# Patient Record
Sex: Female | Born: 1955 | Race: White | Hispanic: No | Marital: Married | Smoking: Never smoker
Health system: Southern US, Community
[De-identification: ages and names within clinical notes are randomized; demographics above are authoritative.]

## PROBLEM LIST (undated history)

## (undated) ENCOUNTER — Ambulatory Visit: Admission: EM

## (undated) DIAGNOSIS — K449 Diaphragmatic hernia without obstruction or gangrene: Secondary | ICD-10-CM

## (undated) DIAGNOSIS — K259 Gastric ulcer, unspecified as acute or chronic, without hemorrhage or perforation: Secondary | ICD-10-CM

## (undated) DIAGNOSIS — K219 Gastro-esophageal reflux disease without esophagitis: Secondary | ICD-10-CM

## (undated) DIAGNOSIS — F32A Depression, unspecified: Secondary | ICD-10-CM

## (undated) DIAGNOSIS — E785 Hyperlipidemia, unspecified: Secondary | ICD-10-CM

## (undated) DIAGNOSIS — I10 Essential (primary) hypertension: Secondary | ICD-10-CM

## (undated) DIAGNOSIS — I219 Acute myocardial infarction, unspecified: Secondary | ICD-10-CM

## (undated) DIAGNOSIS — R519 Headache, unspecified: Secondary | ICD-10-CM

## (undated) HISTORY — DX: Gastro-esophageal reflux disease without esophagitis: K21.9

## (undated) HISTORY — DX: Depression, unspecified: F32.A

## (undated) HISTORY — PX: CHOLECYSTECTOMY: SHX55

## (undated) HISTORY — DX: Acute myocardial infarction, unspecified: I21.9

---

## 2008-01-18 HISTORY — PX: CHOLECYSTECTOMY: SHX55

## 2010-01-28 DIAGNOSIS — Z9049 Acquired absence of other specified parts of digestive tract: Secondary | ICD-10-CM | POA: Insufficient documentation

## 2015-11-10 ENCOUNTER — Emergency Department
Admission: EM | Admit: 2015-11-10 | Discharge: 2015-11-10 | Disposition: A | Discharge status: Home / Self Care | Payer: 59 | Attending: Emergency Medicine | Admitting: Emergency Medicine

## 2015-11-10 ENCOUNTER — Encounter: Payer: Self-pay | Admitting: Emergency Medicine

## 2015-11-10 DIAGNOSIS — G43001 Migraine without aura, not intractable, with status migrainosus: Secondary | ICD-10-CM | POA: Diagnosis not present

## 2015-11-10 DIAGNOSIS — G43909 Migraine, unspecified, not intractable, without status migrainosus: Secondary | ICD-10-CM | POA: Diagnosis present

## 2015-11-10 HISTORY — DX: Gastric ulcer, unspecified as acute or chronic, without hemorrhage or perforation: K25.9

## 2015-11-10 MED ORDER — METOCLOPRAMIDE HCL 5 MG PO TABS: 5.0000 mg | ORAL_TABLET | Freq: Four times a day (QID) | ORAL | 0 refills | Status: DC | PRN

## 2015-11-10 MED ORDER — METOCLOPRAMIDE HCL 5 MG/ML IJ SOLN
INTRAMUSCULAR | Status: AC
  Administered 2015-11-10: 10 mg via INTRAVENOUS
  Filled 2015-11-10: qty 2

## 2015-11-10 MED ORDER — KETOROLAC TROMETHAMINE 30 MG/ML IJ SOLN
30.0000 mg | Freq: Once | INTRAMUSCULAR | Status: AC
  Administered 2015-11-10: 30 mg via INTRAVENOUS

## 2015-11-10 MED ORDER — SODIUM CHLORIDE 0.9 % IV BOLUS (SEPSIS)
1000.0000 mL | Freq: Once | INTRAVENOUS | Status: AC
  Administered 2015-11-10: 1000 mL via INTRAVENOUS

## 2015-11-10 MED ORDER — METOCLOPRAMIDE HCL 5 MG/ML IJ SOLN
10.0000 mg | Freq: Once | INTRAMUSCULAR | Status: AC
  Administered 2015-11-10: 10 mg via INTRAVENOUS

## 2015-11-10 MED ORDER — KETOROLAC TROMETHAMINE 30 MG/ML IJ SOLN
INTRAMUSCULAR | Status: AC
  Administered 2015-11-10: 30 mg via INTRAVENOUS
  Filled 2015-11-10: qty 1

## 2015-11-10 MED ORDER — ACETAMINOPHEN 500 MG PO TABS
ORAL_TABLET | ORAL | Status: AC
  Administered 2015-11-10: 1000 mg via ORAL
  Filled 2015-11-10: qty 2

## 2015-11-10 MED ORDER — ACETAMINOPHEN 500 MG PO TABS
1000.0000 mg | ORAL_TABLET | Freq: Once | ORAL | Status: AC
  Administered 2015-11-10: 1000 mg via ORAL

## 2015-11-10 NOTE — ED Notes (Signed)
Pt ambulatory to lobby. NAD noted.

## 2015-11-10 NOTE — Discharge Instructions (Signed)
Please drink plenty of fluid and get plenty of rest. You may continue to take Tylenol for your headache, and take Reglan if you develop a migraine. Please talk to your primary care physician about whether you are candidate for long-term preventative migraine medication.  Return to the emergency department for severe pain, numbness tingling or weakness, fever, inability to keep down fluids, or any other symptoms concerning to you.

## 2015-11-10 NOTE — ED Triage Notes (Signed)
Pt presents to ED with c/o right sided headache that radiates around to her eye and ear. Pt reports onset of her pain was Sunday but worsened today with nausea and sensitivity to light and sounds. Hx of headaches but only one similar to this one in the past year. Denies fever or vomiting/diarrhea. Pt alert and able to answer questions without difficulty. No increased work of breathing or distress noted.

## 2015-11-10 NOTE — ED Provider Notes (Signed)
[facility] Emergency Department Provider Note  ____________________________________________  Time seen: Approximately 7:51 PM  I have reviewed the triage vital signs and the nursing notes.   HISTORY  Chief Complaint Headache    HPI Joanne Clark is a 60 y.o. female with a history of chronic migraines presenting with migraine.The patient reports that she had a long car ride and her eyes were tired on Saturday. On Sunday, she developed a progressively worsening frontal and right-sided headache associated with photophobia, phonophobia, and mild nausea without vomiting. She also has had a "pulsating" sensation in the right eye without any visual changes including blurred or double vision. No travel outside denies states, trauma, fever, or tick bite. No rash. She has tried a single dose of Excedrin Migraine without improvement. She did feel like ice to the forehead and scalp improved her pain. This headache does feel similar to her previous migraines except that usually she has an aura with lites, did not have that this time.   Past Medical History:  Diagnosis Date  . Multiple gastric ulcers     There are no active problems to display for this patient.   Past Surgical History:  Procedure Laterality Date  . CHOLECYSTECTOMY        Allergies Review of patient's allergies indicates no known allergies.  No family history on file.  Social History Social History  Substance Use Topics  . Smoking status: Never Smoker  . Smokeless tobacco: Never Used  . Alcohol use No    Review of Systems Constitutional: No fever/chills.No trauma. Eyes: Positive photosensitivity. No blurred or double vision. ENT: No sore throat. No congestion or rhinorrhea. No tooth pain. No ear pain. Cardiovascular: Denies chest pain. Denies palpitations. Respiratory: Denies shortness of breath.  No cough. Gastrointestinal: No abdominal pain.  No nausea, no vomiting.  No diarrhea.   No constipation. Genitourinary: Negative for dysuria. Musculoskeletal: Negative for back pain. Skin: Negative for rash. Neurological: Positive for headaches. No focal numbness, tingling or weakness. No difficulty walking. Positive photo and phonophobia sensitivity.  10-point ROS otherwise negative.  ____________________________________________   PHYSICAL EXAM:  VITAL SIGNS: ED Triage Vitals [11/10/15 1913]  Enc Vitals Group     BP (!) 140/91     Pulse Rate 81     Resp 18     Temp 98 F (36.7 C)     Temp Source Oral     SpO2 98 %     Weight 210 lb (95.3 kg)     Height 5\' 8"  (1.727 m)     Head Circumference      Peak Flow      Pain Score 8     Pain Loc      Pain Edu?      Excl. in [place]?     Constitutional: Alert and oriented. Mildly uncomfortable appearing but in no acute distress. Answers questions appropriately. Eyes: Conjunctivae are normal.  EOMI. PERRLA. Positive photophobia. No scleral icterus. Head: Atraumatic. Nose: No congestion/rhinnorhea. Mouth/Throat: Mucous membranes are moist. No evidence of dental abnormality. Neck: No stridor.  Supple.  No JVD. No meningismus. Range of motion without pain. Cardiovascular: Normal rate, regular rhythm. No murmurs, rubs or gallops.  Respiratory: Normal respiratory effort.  No accessory muscle use or retractions. Lungs CTAB.  No wheezes, rales or ronchi. Musculoskeletal: No LE edema.  Neurologic:  A&Ox3.  Speech is clear.  Face and smile are symmetric.  EOMI. PERRLA. No nystagmus horizontally or vertically. Normal gait without ataxia. Moves all  extremities well. Skin:  Skin is warm, dry and intact. No rash noted. Psychiatric: Mood and affect are normal. Speech and behavior are normal.  Normal judgement.  ____________________________________________   LABS (all labs ordered are listed, but only abnormal results are displayed)  Labs Reviewed - No data to display ____________________________________________  EKG  Not  indicated ____________________________________________  RADIOLOGY  No results found.  ____________________________________________   PROCEDURES  Procedure(s) performed: None  Procedures  Critical Care performed: No ____________________________________________   INITIAL IMPRESSION / ASSESSMENT AND PLAN / ED COURSE  Pertinent labs & imaging results that were available during my care of the patient were reviewed by me and considered in my medical decision making (see chart for details).  60 y.o. female with a history of migraines presenting with migraine area and overall, the patient has a reassuring examination and there is no evidence of an acute neurologic deficit or acute CVA. I do not think she has temporal arteritis and it is very unlikely that she would have intercranial hemorrhage or an intracranial clot. We'll plan to treat her symptomatically, and reevaluate the patient. She does have a history of gastric ulcers, but I'll give her a single dose of Toradol with Tylenol and Reglan, and avoid narcotics for this patient.  ----------------------------------------- 8:56 PM on 11/10/2015 -----------------------------------------  Patient reports that her pain is significantly better and has improved from an 8 out of 10 to a 4 out of 10 at this time. I have offered her additional intravenous treatment, including finishing her fluids, and additional medications, but she prefers to go home with oral me I have discussed return precautions as well as follow-up with this patient and her daughter.Plan discharge. ____________________________________________  FINAL CLINICAL IMPRESSION(S) / ED DIAGNOSES  Final diagnoses:  Migraine without aura and with status migrainosus, not intractable    Clinical Course      NEW MEDICATIONS STARTED DURING THIS VISIT:  New Prescriptions   METOCLOPRAMIDE (REGLAN) 5 MG TABLET    Take 1 tablet (5 mg total) by mouth every 6 (six) hours as needed  for nausea or vomiting (migraine).      Eula Listen, MD 11/10/15 2059

## 2017-06-02 ENCOUNTER — Emergency Department
Admission: EM | Admit: 2017-06-02 | Discharge: 2017-06-02 | Disposition: A | Discharge status: Home / Self Care | Payer: 59 | Attending: Emergency Medicine | Admitting: Emergency Medicine

## 2017-06-02 ENCOUNTER — Emergency Department: Payer: 59

## 2017-06-02 ENCOUNTER — Other Ambulatory Visit: Payer: Self-pay

## 2017-06-02 DIAGNOSIS — M7731 Calcaneal spur, right foot: Secondary | ICD-10-CM | POA: Diagnosis not present

## 2017-06-02 DIAGNOSIS — M19071 Primary osteoarthritis, right ankle and foot: Secondary | ICD-10-CM | POA: Diagnosis not present

## 2017-06-02 DIAGNOSIS — M7751 Other enthesopathy of right foot: Secondary | ICD-10-CM

## 2017-06-02 DIAGNOSIS — M79671 Pain in right foot: Secondary | ICD-10-CM | POA: Diagnosis present

## 2017-06-02 DIAGNOSIS — Z79899 Other long term (current) drug therapy: Secondary | ICD-10-CM | POA: Diagnosis not present

## 2017-06-02 MED ORDER — TRAMADOL HCL 50 MG PO TABS: 50.0000 mg | ORAL_TABLET | Freq: Four times a day (QID) | ORAL | 0 refills | Status: DC | PRN

## 2017-06-02 MED ORDER — TRAMADOL HCL 50 MG PO TABS
50.0000 mg | ORAL_TABLET | Freq: Once | ORAL | Status: AC
  Administered 2017-06-02: 50 mg via ORAL
  Filled 2017-06-02: qty 1

## 2017-06-02 MED ORDER — MELOXICAM 15 MG PO TABS: 15.0000 mg | ORAL_TABLET | Freq: Every day | ORAL | 0 refills | Status: DC

## 2017-06-02 NOTE — ED Provider Notes (Signed)
[facility] Emergency Department Provider Note ____________________________________________  Time seen: Approximately 5:43 PM  I have reviewed the triage vital signs and the nursing notes.   HISTORY  Chief Complaint Foot Pain    HPI Joanne Clark is a 62 y.o. female who presents to the emergency department for evaluation and treatment of right foot pain that has been present for several months but has gradually worsened with a sudden increase in pain last night. She states that she bent over and felt something in her foot "pop." She has had intermittent tingling in the foot with radiation into the calf for several months. She reports that she frequently travels to [place] and pain seems to be worse after the drive. She has applied lidocaine patches over the foot and ankle without relief. Otherwise, no alleviating measures attempted for this complaint.  Past Medical History:  Diagnosis Date  . Multiple gastric ulcers     There are no active problems to display for this patient.   Past Surgical History:  Procedure Laterality Date  . CHOLECYSTECTOMY      Prior to Admission medications   Medication Sig Start Date End Date Taking? Authorizing Provider  meloxicam (MOBIC) 15 MG tablet Take 1 tablet (15 mg total) by mouth daily. 06/02/17   Marycarmen Hagey, Johnette Abraham B, FNP  metoCLOPramide (REGLAN) 5 MG tablet Take 1 tablet (5 mg total) by mouth every 6 (six) hours as needed for nausea or vomiting (migraine). 11/10/15 11/09/16  Eula Listen, MD  traMADol (ULTRAM) 50 MG tablet Take 1 tablet (50 mg total) by mouth every 6 (six) hours as needed. 06/02/17   Victorino Dike, FNP    Allergies Patient has no known allergies.  History reviewed. No pertinent family history.  Social History Social History   Tobacco Use  . Smoking status: Never Smoker  . Smokeless tobacco: Never Used  Substance Use Topics  . Alcohol use: No  . Drug use: No    Review of  Systems Constitutional: Negative for fever. Cardiovascular: Negative for chest pain. Respiratory: Negative for shortness of breath. Musculoskeletal: Positive for right foot and ankle pain Skin: negative for open wound or lesion.  Neurological: Negative for decrease in sensation. Positive for intermittent paresthesias.  ____________________________________________   PHYSICAL EXAM:  VITAL SIGNS: ED Triage Vitals  Enc Vitals Group     BP 06/02/17 1700 134/82     Pulse Rate 06/02/17 1658 73     Resp 06/02/17 1658 16     Temp 06/02/17 1658 98.1 F (36.7 C)     Temp Source 06/02/17 1658 Oral     SpO2 06/02/17 1658 98 %     Weight 06/02/17 1700 215 lb (97.5 kg)     Height 06/02/17 1700 5\' 8"  (1.727 m)     Head Circumference --      Peak Flow --      Pain Score 06/02/17 1700 4     Pain Loc --      Pain Edu? --      Excl. in [place]? --     Constitutional: Alert and oriented. Well appearing and in no acute distress. Eyes: Conjunctivae are clear without discharge or drainage Head: Atraumatic Neck: Supple Respiratory: No cough. Respirations are even and unlabored. Musculoskeletal: No edema of the right foot or ankle. Strength is 5/5. [place] ankle rules are negative.  Neurologic: Sharp and dull sensation of the right foot is intact.  Skin: Intact without open wound or lesion.  Psychiatric: Affect and behavior  are appropriate.  ____________________________________________   LABS (all labs ordered are listed, but only abnormal results are displayed)  Labs Reviewed - No data to display ____________________________________________  RADIOLOGY  Negative right foot image per radiology. ____________________________________________   PROCEDURES  .Splint Application Date/Time: 0/81/4481 5:50 PM Performed by: Archie Endo, NT Authorized by: Victorino Dike, FNP   Consent:    Consent obtained:  Verbal   Consent given by:  Patient   Risks discussed:  Pain Pre-procedure  details:    Sensation:  Normal   Skin color:  Normal Procedure details:    Laterality:  Right   Location:  Foot   Supplies:  Elastic bandage (and post op shoe) Post-procedure details:    Pain:  Unchanged   Sensation:  Normal   Patient tolerance of procedure:  Tolerated well, no immediate complications  ___________________________________________   INITIAL IMPRESSION / ASSESSMENT AND PLAN / ED COURSE  [name] is a 62 y.o. who presents to the emergency department for treatment and evaluation of right foot and ankle pain. Image reviewed. No acute findings. Osteoarthritis and osteophytes noted. She will be treated with post op shoe, ACE bandage, meloxicam, and tramadol. She was encouraged to see the podiatrist when she arrives back home ([place]). She was advised to see her PCP if unable to see the podiatrist.  Medications  traMADol (ULTRAM) tablet 50 mg (50 mg Oral Given 06/02/17 1801)    Pertinent labs & imaging results that were available during my care of the patient were reviewed by me and considered in my medical decision making (see chart for details).  _________________________________________   FINAL CLINICAL IMPRESSION(S) / ED DIAGNOSES  Final diagnoses:  Osteoarthritis of right foot, unspecified osteoarthritis type  Bone spur of right foot    ED Discharge Orders        Ordered    meloxicam (MOBIC) 15 MG tablet  Daily     06/02/17 1804    traMADol (ULTRAM) 50 MG tablet  Every 6 hours PRN     06/02/17 1804       If controlled substance prescribed during this visit, 12 month history viewed on the [place] prior to issuing an initial prescription for Schedule II or III opiod.    Victorino Dike, FNP 06/02/17 1812    Harvest Dark, MD 06/02/17 2248

## 2017-06-02 NOTE — ED Triage Notes (Signed)
Pt states has been having R foot pain x a while. States last night bent down and heard a pop and pain became worse. 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 Ambulatory on foot. Alert, oriented. States pain is sharp.

## 2017-09-08 ENCOUNTER — Encounter: Payer: Self-pay | Admitting: Emergency Medicine

## 2017-09-08 ENCOUNTER — Other Ambulatory Visit: Payer: Self-pay

## 2017-09-08 ENCOUNTER — Ambulatory Visit
Admission: EM | Admit: 2017-09-08 | Discharge: 2017-09-08 | Disposition: A | Discharge status: Home / Self Care | Payer: 59 | Attending: Family Medicine | Admitting: Family Medicine

## 2017-09-08 DIAGNOSIS — K449 Diaphragmatic hernia without obstruction or gangrene: Secondary | ICD-10-CM

## 2017-09-08 DIAGNOSIS — R1013 Epigastric pain: Secondary | ICD-10-CM

## 2017-09-08 DIAGNOSIS — K219 Gastro-esophageal reflux disease without esophagitis: Secondary | ICD-10-CM | POA: Diagnosis not present

## 2017-09-08 LAB — BASIC METABOLIC PANEL
Anion gap: 8 (ref 5–15)
BUN: 13 mg/dL (ref 8–23)
CO2: 25 mmol/L (ref 22–32)
Calcium: 9.4 mg/dL (ref 8.9–10.3)
Chloride: 108 mmol/L (ref 98–111)
Creatinine, Ser: 0.66 mg/dL (ref 0.44–1.00)
GFR calc Af Amer: >60 mL/min (ref >60)
GFR calc non Af Amer: >60 mL/min (ref >60)
Glucose, Bld: 96 mg/dL (ref 70–99)
Potassium: 3.7 mmol/L (ref 3.5–5.1)
Sodium: 141 mmol/L (ref 135–145)

## 2017-09-08 LAB — CBC WITH DIFFERENTIAL/PLATELET
Basophils Absolute: 0.1 10*3/uL (ref 0–0.1)
Basophils Relative: 1 %
Eosinophils Absolute: 0.2 10*3/uL (ref 0–0.7)
Eosinophils Relative: 3 %
HCT: 41 % (ref 35.0–47.0)
Hemoglobin: 13.7 g/dL (ref 12.0–16.0)
Lymphocytes Relative: 23 %
Lymphs Abs: 1.4 10*3/uL (ref 1.0–3.6)
MCH: 30.1 pg (ref 26.0–34.0)
MCHC: 33.4 g/dL (ref 32.0–36.0)
MCV: 90.2 fL (ref 80.0–100.0)
Monocytes Absolute: 0.5 10*3/uL (ref 0.2–0.9)
Monocytes Relative: 8 %
Neutro Abs: 3.8 10*3/uL (ref 1.4–6.5)
Neutrophils Relative %: 65 %
Platelets: 235 10*3/uL (ref 150–440)
RBC: 4.55 MIL/uL (ref 3.80–5.20)
RDW: 13.9 % (ref 11.5–14.5)
WBC: 5.9 10*3/uL (ref 3.6–11.0)

## 2017-09-08 LAB — LIPASE, BLOOD: Lipase: 23 U/L (ref 11–51)

## 2017-09-08 MED ORDER — GI COCKTAIL ~~LOC~~
30.0000 mL | Freq: Once | ORAL | Status: AC
  Administered 2017-09-08: 30 mL via ORAL

## 2017-09-08 MED ORDER — SUCRALFATE 1 G PO TABS: 1.0000 g | ORAL_TABLET | Freq: Three times a day (TID) | ORAL | 0 refills | Status: DC

## 2017-09-08 MED ORDER — PANTOPRAZOLE SODIUM 40 MG PO TBEC: 40.0000 mg | DELAYED_RELEASE_TABLET | Freq: Every day | ORAL | 0 refills | Status: DC

## 2017-09-08 NOTE — ED Triage Notes (Signed)
Pt c/o abdominal pain. Located all over her abdomen but worse in the LUQ. She has more reflux than normal. She was seen at ED in [place] about 2 months ago and given something to coat her stomach. No nausea, vomiting, diarrhea. She took Maalox and Zantac last night and helped a little.

## 2017-09-08 NOTE — Discharge Instructions (Signed)
Arrange appointment with gastroenterologist in [place] when you return.  If symptoms worsen go to the emergency room

## 2017-09-08 NOTE — ED Provider Notes (Signed)
[facility]    CSN: [id number] Arrival date & time: 09/08/17  0938     History   Chief Complaint Chief Complaint  Patient presents with  . Abdominal Pain    HPI Joanne Clark is a 62 y.o. female.   HPI  62 year old female visiting from [place] presents with abdominal pain she has had for the last 2 days.  States that it is worse in the epigastric region down to just superior to the umbilicus.  She is experiencing more reflux than normal.  In June she was seen in the emergency department in [facility] were normal and the CT scan showed possible hiatal hernia no bowel thickening and no mention of diverticulitis or diverticulosis.  She was given Carafate and Protonix at that time.  She states that she had run out of the Protonix had given only 30-day supply.  Had no nausea or vomiting denies any diarrhea.  Denies any hematemesis or melena. she has been passing gas normally.  Has been pushing her husband who weighs over 300 pounds in a wheelchair up ramps Wednesday.         Past Medical History:  Diagnosis Date  . Multiple gastric ulcers     There are no active problems to display for this patient.   Past Surgical History:  Procedure Laterality Date  . CHOLECYSTECTOMY      OB History   None      Home Medications    Prior to Admission medications   Medication Sig Start Date End Date Taking? Authorizing Provider  pantoprazole (PROTONIX) 40 MG tablet Take 1 tablet (40 mg total) by mouth daily. 09/08/17   Lorin Picket, PA-C  sucralfate (CARAFATE) 1 g tablet Take 1 tablet (1 g total) by mouth 4 (four) times daily -  with meals and at bedtime. 09/08/17   Lorin Picket, PA-C    Family History Family History  Problem Relation Age of Onset  . Dementia Mother   . Dementia Father     Social History Social History   Tobacco Use  . Smoking status: Never Smoker  . Smokeless tobacco: Never Used  Substance Use Topics  . Alcohol use: No  .  Drug use: No     Allergies   Patient has no known allergies.   Review of Systems Review of Systems  Constitutional: Positive for activity change and appetite change. Negative for chills, fatigue and fever.  Gastrointestinal: Positive for abdominal pain. Negative for abdominal distention, constipation, diarrhea, nausea and vomiting.  All other systems reviewed and are negative.    Physical Exam Triage Vital Signs ED Triage Vitals  Enc Vitals Group     BP 09/08/17 0939 136/81     Pulse Rate 09/08/17 0939 72     Resp 09/08/17 0939 17     Temp 09/08/17 0939 97.6 F (36.4 C)     Temp Source 09/08/17 0939 Oral     SpO2 09/08/17 0939 100 %     Weight 09/08/17 0937 200 lb (90.7 kg)     Height 09/08/17 0937 5\' 8"  (1.727 m)     Head Circumference --      Peak Flow --      Pain Score 09/08/17 0935 4     Pain Loc --      Pain Edu? --      Excl. in [place]? --    No data found.  Updated Vital Signs BP 136/81 (BP Location: Left Arm)  Pulse 72   Temp 97.6 F (36.4 C) (Oral)   Resp 17   Ht 5\' 8"  (1.727 m)   Wt 200 lb (90.7 kg)   SpO2 100%   BMI 30.41 kg/m   Visual Acuity Right Eye Distance:   Left Eye Distance:   Bilateral Distance:    Right Eye Near:   Left Eye Near:    Bilateral Near:     Physical Exam  Constitutional: She is oriented to person, place, and time. She appears well-developed and well-nourished.  Non-toxic appearance. She does not appear ill. No distress.  HENT:  Head: Normocephalic.  Eyes: Pupils are equal, round, and reactive to light.  Pulmonary/Chest: Effort normal and breath sounds normal.  Abdominal: Bowel sounds are normal. She exhibits no distension, no ascites, no pulsatile midline mass and no mass. There is tenderness in the epigastric area. There is no rigidity, no rebound, no guarding, no CVA tenderness and negative Murphy's sign.  The abdomen exam was performed with Nira Conn, RN as Education officer, museum.  There is no distention present.   Bowel sounds are normal in all quadrants.  Patient is maximally tender in the epigastric region.  Is less tenderness periumbilical.  There is no rebound and no guarding.  No masses are palpable.  Neurological: She is alert and oriented to person, place, and time.  Skin: Skin is warm and dry.  Psychiatric: She has a normal mood and affect. Her behavior is normal.  Nursing note and vitals reviewed.    UC Treatments / Results  Labs (all labs ordered are listed, but only abnormal results are displayed) Labs Reviewed  BASIC METABOLIC PANEL  LIPASE, BLOOD  CBC WITH DIFFERENTIAL/PLATELET    EKG None  Radiology No results found.  Procedures Procedures (including critical care time)  Medications Ordered in UC Medications  gi cocktail (Maalox,Lidocaine,Donnatal) (30 mLs Oral Given 09/08/17 1019)  She got significant relief of her pain after the GI cocktail  Initial Impression / Assessment and Plan / UC Course  I have reviewed the triage vital signs and the nursing notes.  Pertinent labs & imaging results that were available during my care of the patient were reviewed by me and considered in my medical decision making (see chart for details).     Plan: 1. Test/x-ray results and diagnosis reviewed with patient 2. rx as per orders; risks, benefits, potential side effects reviewed with patient 3. Recommend supportive treatment with choices for people with GERD information provided written.  Use Carafate and Protonix daily.  Recommend following up with a gastroenterologist when she returns to [place] next week.  If She has any worsening of symptoms she should go to the emergency room 4. F/u prn if symptoms worsen or don't improve  Final Clinical Impressions(s) / UC Diagnoses   Final diagnoses:  Epigastric pain  Hiatal hernia with GERD     Discharge Instructions     Arrange appointment with gastroenterologist in [place] when you return.  If symptoms worsen go to the emergency  room    ED Prescriptions    Medication Sig Dispense Auth. Provider   pantoprazole (PROTONIX) 40 MG tablet Take 1 tablet (40 mg total) by mouth daily. 30 tablet Crecencio Mc P, PA-C   sucralfate (CARAFATE) 1 g tablet Take 1 tablet (1 g total) by mouth 4 (four) times daily -  with meals and at bedtime. 120 tablet Lorin Picket, PA-C     Controlled Substance Prescriptions [place] Controlled Substance Registry consulted? Not  Applicable   Lorin Picket, PA-C 09/08/17 1056

## 2017-10-17 HISTORY — PX: COLONOSCOPY W/ ENDOSCOPIC US: SHX1379

## 2017-12-23 ENCOUNTER — Emergency Department
Admission: EM | Admit: 2017-12-23 | Discharge: 2017-12-23 | Disposition: A | Discharge status: Home / Self Care | Payer: 59 | Attending: Emergency Medicine | Admitting: Emergency Medicine

## 2017-12-23 ENCOUNTER — Encounter: Payer: Self-pay | Admitting: Emergency Medicine

## 2017-12-23 ENCOUNTER — Emergency Department: Payer: 59

## 2017-12-23 ENCOUNTER — Other Ambulatory Visit: Payer: Self-pay

## 2017-12-23 DIAGNOSIS — R109 Unspecified abdominal pain: Secondary | ICD-10-CM

## 2017-12-23 DIAGNOSIS — Z79899 Other long term (current) drug therapy: Secondary | ICD-10-CM | POA: Diagnosis not present

## 2017-12-23 DIAGNOSIS — M5416 Radiculopathy, lumbar region: Secondary | ICD-10-CM | POA: Insufficient documentation

## 2017-12-23 DIAGNOSIS — R1032 Left lower quadrant pain: Secondary | ICD-10-CM | POA: Diagnosis not present

## 2017-12-23 HISTORY — DX: Diaphragmatic hernia without obstruction or gangrene: K44.9

## 2017-12-23 LAB — CBC
HCT: 42.6 % (ref 36.0–46.0)
HEMOGLOBIN: 13.7 g/dL (ref 12.0–15.0)
MCH: 29.3 pg (ref 26.0–34.0)
MCHC: 32.2 g/dL (ref 30.0–36.0)
MCV: 91.2 fL (ref 80.0–100.0)
NRBC: 0 % (ref 0.0–0.2)
PLATELETS: 250 10*3/uL (ref 150–400)
RBC: 4.67 MIL/uL (ref 3.87–5.11)
RDW: 13.9 % (ref 11.5–15.5)
WBC: 5.8 10*3/uL (ref 4.0–10.5)

## 2017-12-23 LAB — URINALYSIS, COMPLETE (UACMP) WITH MICROSCOPIC
Bacteria, UA: NONE SEEN
Bilirubin Urine: NEGATIVE
GLUCOSE, UA: NEGATIVE mg/dL
KETONES UR: NEGATIVE mg/dL
LEUKOCYTES UA: NEGATIVE
Nitrite: NEGATIVE
PROTEIN: NEGATIVE mg/dL
SQUAMOUS EPITHELIAL / LPF: NONE SEEN (ref 0–5)
Specific Gravity, Urine: 1.006 (ref 1.005–1.030)
WBC UA: NONE SEEN WBC/hpf (ref 0–5)
pH: 5 (ref 5.0–8.0)

## 2017-12-23 LAB — COMPREHENSIVE METABOLIC PANEL
ALBUMIN: 4 g/dL (ref 3.5–5.0)
ALT: 31 U/L (ref 0–44)
ANION GAP: 5 (ref 5–15)
AST: 24 U/L (ref 15–41)
Alkaline Phosphatase: 85 U/L (ref 38–126)
BUN: 19 mg/dL (ref 8–23)
CHLORIDE: 107 mmol/L (ref 98–111)
CO2: 28 mmol/L (ref 22–32)
Calcium: 9.5 mg/dL (ref 8.9–10.3)
Creatinine, Ser: 0.63 mg/dL (ref 0.44–1.00)
GFR calc non Af Amer: >60 mL/min (ref >60)
GLUCOSE: 102 mg/dL — AB (ref 70–99)
POTASSIUM: 3.9 mmol/L (ref 3.5–5.1)
SODIUM: 140 mmol/L (ref 135–145)
Total Bilirubin: 0.7 mg/dL (ref 0.3–1.2)
Total Protein: 7.2 g/dL (ref 6.5–8.1)

## 2017-12-23 LAB — LIPASE, BLOOD: Lipase: 29 U/L (ref 11–51)

## 2017-12-23 MED ORDER — SUCRALFATE 1 G PO TABS: 1.0000 g | ORAL_TABLET | Freq: Four times a day (QID) | ORAL | 1 refills | Status: DC

## 2017-12-23 MED ORDER — NAPROXEN 500 MG PO TABS: 500.0000 mg | ORAL_TABLET | Freq: Two times a day (BID) | ORAL | 0 refills | Status: DC

## 2017-12-23 MED ORDER — GABAPENTIN 100 MG PO CAPS: ORAL_CAPSULE | ORAL | 0 refills | Status: DC

## 2017-12-23 NOTE — ED Triage Notes (Addendum)
L flank pain x 2 week, increased today. Denies fevers or dysuria.

## 2017-12-23 NOTE — ED Notes (Signed)
Waiting on disposition. No acute needs. VSS

## 2017-12-23 NOTE — ED Provider Notes (Signed)
[facility] Provider Note  ____________________________________________  Time seen: Approximately 7:52 PM  I have reviewed the triage vital signs and the nursing notes.   HISTORY  Chief Complaint Flank Pain    HPI Joanne Clark is a 62 y.o. female with a history of gastric ulcers and hiatal hernia who comes the ED complaining of left flank pain wrapping around toward the left lower abdomen, intermittent and waxing and waning for the past month.  Worse with lifting and standing for long periods of time.  No alleviating factors.  Moderate intensity.  Burning sensation.  She also has a history of chronic left upper quadrant pain that has had an extensive work-up in [place] from which she recently moved in the last few weeks.  She denies any chest pain shortness of breath or pleuritic symptoms.  No vomiting diarrhea bloody stool or black stool.  She is eating and drinking normally and otherwise feeling functional.  She would like referral information for primary care and gastroenterology in the area.      Past Medical History:  Diagnosis Date  . Hiatal hernia   . Multiple gastric ulcers      There are no active problems to display for this patient.    Past Surgical History:  Procedure Laterality Date  . CHOLECYSTECTOMY       Prior to Admission medications   Medication Sig Start Date End Date Taking? Authorizing Provider  gabapentin (NEURONTIN) 100 MG capsule Start with 100mg  three times daily by mouth for one week.  If symptoms are not improved, you may then increase the dose to 200mg  by mouth three times daily.  Remain at this dose until you follow up with primary care. 12/23/17   Carrie Mew, MD  naproxen (NAPROSYN) 500 MG tablet Take 1 tablet (500 mg total) by mouth 2 (two) times daily with a meal. 12/23/17   Carrie Mew, MD  pantoprazole (PROTONIX) 40 MG tablet Take 1 tablet (40 mg total) by mouth daily. 09/08/17    Lorin Picket, PA-C  sucralfate (CARAFATE) 1 g tablet Take 1 tablet (1 g total) by mouth 4 (four) times daily -  with meals and at bedtime. 09/08/17   Lorin Picket, PA-C  sucralfate (CARAFATE) 1 g tablet Take 1 tablet (1 g total) by mouth 4 (four) times daily. 12/23/17   Carrie Mew, MD   Protonix daily  Allergies Methylprednisolone   Family History  Problem Relation Age of Onset  . Dementia Mother   . Dementia Father     Social History Social History   Tobacco Use  . Smoking status: Never Smoker  . Smokeless tobacco: Never Used  Substance Use Topics  . Alcohol use: No  . Drug use: No    Review of Systems  Constitutional:   No fever or chills.  ENT:   No sore throat. No rhinorrhea. Cardiovascular:   No chest pain or syncope. Respiratory:   No dyspnea or cough. Gastrointestinal:   Negative for abdominal pain, vomiting and diarrhea.  No dysuria or hematuria Musculoskeletal:   Positive left flank pain as above. All other systems reviewed and are negative except as documented above in ROS and HPI.  ____________________________________________   PHYSICAL EXAM:  VITAL SIGNS: ED Triage Vitals  Enc Vitals Group     BP 12/23/17 1239 129/79     Pulse Rate 12/23/17 1239 75     Resp 12/23/17 1239 20     Temp 12/23/17 1239 98 F (  36.7 C)     Temp Source 12/23/17 1239 Oral     SpO2 12/23/17 1239 99 %     Weight 12/23/17 1242 200 lb (90.7 kg)     Height 12/23/17 1242 5\' 8"  (1.727 m)     Head Circumference --      Peak Flow --      Pain Score 12/23/17 1242 6     Pain Loc --      Pain Edu? --      Excl. in [place]? --     Vital signs reviewed, nursing assessments reviewed.   Constitutional:   Alert and oriented. Non-toxic appearance. Eyes:   Conjunctivae are normal. EOMI. PERRL. ENT      Head:   Normocephalic and atraumatic.      Nose:   No congestion/rhinnorhea.       Mouth/Throat:   MMM, no pharyngeal erythema. No peritonsillar mass.       Neck:   No  meningismus. Full ROM. Hematological/Lymphatic/Immunilogical:   No cervical lymphadenopathy. Cardiovascular:   RRR. Symmetric bilateral radial and DP pulses.  No murmurs. Cap refill less than 2 seconds. Respiratory:   Normal respiratory effort without tachypnea/retractions. Breath sounds are clear and equal bilaterally. No wheezes/rales/rhonchi. Gastrointestinal:   Soft and nontender. Non distended. There is no CVA tenderness.  No rebound, rigidity, or guarding.  Musculoskeletal:   Normal range of motion in all extremities. No joint effusions.  No lower extremity tenderness.  No edema.  Tenderness in the paraspinous musculature in the area of L3-L4 which reproduces symptoms. Neurologic:   Normal speech and language.  Motor grossly intact. No acute focal neurologic deficits are appreciated.  Skin:    Skin is warm, dry and intact. No rash noted.  No petechiae, purpura, or bullae.  ____________________________________________    LABS (pertinent positives/negatives) (all labs ordered are listed, but only abnormal results are displayed) Labs Reviewed  COMPREHENSIVE METABOLIC PANEL - Abnormal; Notable for the following components:      Result Value   Glucose, Bld 102 (*)    All other components within normal limits  URINALYSIS, COMPLETE (UACMP) WITH MICROSCOPIC - Abnormal; Notable for the following components:   Color, Urine STRAW (*)    APPearance CLEAR (*)    Hgb urine dipstick SMALL (*)    All other components within normal limits  CBC  LIPASE, BLOOD   ____________________________________________   EKG    ____________________________________________    RADIOLOGY  Ct Renal Stone Study  Result Date: 12/23/2017 CLINICAL DATA:  Acute left flank pain. EXAM: CT ABDOMEN AND PELVIS WITHOUT CONTRAST TECHNIQUE: Multidetector CT imaging of the abdomen and pelvis was performed following the standard protocol without IV contrast. COMPARISON:  None. FINDINGS: Lower chest: The lung bases  are clear of acute process. No pleural effusion or pulmonary lesions. The heart is normal in size. No pericardial effusion. The distal esophagus and aorta are unremarkable. Hepatobiliary: No focal hepatic lesions or intrahepatic biliary dilatation. The gallbladder surgically absent. No common bile duct dilatation. Pancreas: No mass, inflammation or ductal dilatation. Spleen: Normal size.  No focal lesions. Adrenals/Urinary Tract: The adrenal glands and kidneys are unremarkable. No renal, ureteral or bladder calculi or mass without contrast. Stomach/Bowel: The stomach, duodenum, small bowel and colon are grossly normal without oral contrast. No inflammatory changes, mass lesions or obstructive findings. The terminal ileum and appendix are normal. Vascular/Lymphatic: Scattered atherosclerotic calcifications involving the aorta and iliac arteries but no aneurysm. No mesenteric or retroperitoneal mass or adenopathy. Reproductive:  The uterus and ovaries are unremarkable. Other: No pelvic mass or adenopathy. No free pelvic fluid collections. No inguinal mass or adenopathy. No abdominal wall hernia or subcutaneous lesions. Musculoskeletal: No significant bony findings. IMPRESSION: 1. No acute abdominal/pelvic findings, mass lesions or adenopathy. 2. No renal, ureteral or bladder calculi or mass. 3. Status post cholecystectomy.  No biliary dilatation. Electronically Signed   By: Marijo Sanes M.D.   On: 12/23/2017 18:10    ____________________________________________   PROCEDURES Procedures  ____________________________________________  DIFFERENTIAL DIAGNOSIS   Kidney stone, lumbar strain, pinched nerve causing cutaneous neuropathy/radiculopathy  CLINICAL IMPRESSION / ASSESSMENT AND PLAN / ED COURSE  Pertinent labs & imaging results that were available during my care of the patient were reviewed by me and considered in my medical decision making (see chart for details).    Patient not in distress,  nontoxic.  Vital signs are normal, presents with subacute to chronic low back pain that seems to be exacerbated by increased exertion and lifting recently.  By exam this is a pinched nerve and I recommended the patient use heat therapy, stretching, NSAIDs, and gabapentin if symptoms not improved in the next couple days.  I obtain a CT scan to ensure that she does not have a critical stenosis or spinal fracture or large kidney stone.  CT scan is normal as are her labs.  Vitals remain normal, she is suitable for outpatient follow-up.  Given referral information for primary care and gastroenterology.  Is no signs of cauda equina syndrome or epidural abscess or other CNS/central spinal process.      ____________________________________________   FINAL CLINICAL IMPRESSION(S) / ED DIAGNOSES    Final diagnoses:  Left flank pain  Lumbar radiculopathy     ED Discharge Orders         Ordered    naproxen (NAPROSYN) 500 MG tablet  2 times daily with meals     12/23/17 1951    gabapentin (NEURONTIN) 100 MG capsule     12/23/17 1951    sucralfate (CARAFATE) 1 g tablet  4 times daily     12/23/17 1951          Portions of this note were generated with dragon dictation software. Dictation errors may occur despite best attempts at proofreading.    Carrie Mew, MD 12/23/17 Karl Bales

## 2017-12-23 NOTE — Discharge Instructions (Signed)
Your CT scan and labs today were all normal.  Continue Protonix and Carafate for your GERD symptoms.  Naproxen and gabapentin can be helpful for your back pain.

## 2017-12-23 NOTE — ED Notes (Signed)
Patient transported to CT 

## 2018-01-19 ENCOUNTER — Telehealth: Payer: Self-pay | Admitting: Gastroenterology

## 2018-01-19 ENCOUNTER — Encounter: Payer: Self-pay | Admitting: Gastroenterology

## 2018-01-19 NOTE — Telephone Encounter (Signed)
LEFT VM FOR PT TO CALL OFFICE AND SCHEDULE ED F/U WITH DR. VANGA

## 2018-02-14 ENCOUNTER — Ambulatory Visit (INDEPENDENT_AMBULATORY_CARE_PROVIDER_SITE_OTHER): Payer: Self-pay | Admitting: Gastroenterology

## 2018-02-14 ENCOUNTER — Encounter: Payer: Self-pay | Admitting: Gastroenterology

## 2018-02-14 ENCOUNTER — Other Ambulatory Visit: Payer: Self-pay

## 2018-02-14 VITALS — BP 122/77 | HR 71 | Resp 17 | Ht 68.0 in | Wt 212.8 lb

## 2018-02-14 DIAGNOSIS — G8929 Other chronic pain: Secondary | ICD-10-CM

## 2018-02-14 DIAGNOSIS — M545 Low back pain, unspecified: Secondary | ICD-10-CM

## 2018-02-14 DIAGNOSIS — R12 Heartburn: Secondary | ICD-10-CM

## 2018-02-14 MED ORDER — PANTOPRAZOLE SODIUM 40 MG PO TBEC: 40.0000 mg | DELAYED_RELEASE_TABLET | Freq: Every day | ORAL | 0 refills | Status: DC

## 2018-02-14 MED ORDER — CYCLOBENZAPRINE HCL 10 MG PO TABS: 10.0000 mg | ORAL_TABLET | Freq: Three times a day (TID) | ORAL | 0 refills | Status: DC | PRN

## 2018-02-14 NOTE — Progress Notes (Signed)
[name], MD [address]  [place]  [place], [place] [address]  Main: [phone]  Fax: [phone]    Gastroenterology Consultation  Referring Provider:     No ref. provider found Primary Care Physician:  System, Pcp Not In Primary Gastroenterologist:  Dr. Kathaleen Grinder Reason for Consultation:     Chronic left lower back pain        HPI:   Joanne Clark is a 63 y.o. female referred from ER for consultation & management of chronic left lower back pain.  [place].  She reports 8 months history of progressively worsening left lower back pain.  The pain initially started as discomfort below the left rib cage then moving to left lower back.  This started 8 months ago in [place], and she acknowledges that she did work her up lifting heavy weights. She was seen by GI in [place], Dr. Kathaleen Grinder initially as she was concerned if she had a hernia that popped causing the pain.  She underwent CT abdomen and pelvis followed by EGD, colonoscopy and gastric emptying study which were all unremarkable.  She was found to have 5 cm hiatal hernia per the EGD report.  However CT abdomen pelvis reported small hiatal hernia.  Patient reports intermittent heartburn for which she is taking Protonix daily.  She denies any other GI symptoms.  She denies weight loss, loss of appetite  Over the course of several months, she has noticed worsening of pain particularly when getting into the car and after prolonged walking.  She had atleast two exacerbations requiring ER visits.  Most recently, she went to [facility] ER, underwent CT renal study protocol which was unremarkable.  Her labs were unremarkable patient underwent cholecystectomy 2 years ago for chronic right upper quadrant pain and she was told that she has severe biliary dyskinesia based on the HIDA scan.  During majority of encounter with her, patient was pointing to left lateral lower  back with her finger  NSAIDs: None  Antiplts/Anticoagulants/Anti thrombotics: None  GI Procedures:   NM gastric emptying study 10/27/2017 On the first image, there is adequate radiotracer uptake within the stomach. There is no radiotracer within the distal esophagus to suggest gastroesophageal reflux.  On the 60 minute image, there is 78% radiotracer remaining within the stomach (upper limit of normal - 90%*).  On the 120 minute image, there is 34% radiotracer remaining within the stomach (upper limit of normal - 60%*).  On the 180 minute image, there is 6% radiotracer remaining within the stomach (upper limit of normal - 30%*).  On the 240 minute image, there is 5% radiotracer remaining within the stomach (upper limit of normal - 10%*).  **Normal limits of gastric retention data is from Am J Gastroenterology. 2007;102:1-11.   T1/2 time of 98 minutes is indicative of mildly delayed gastric emptying (upper    EGD 10/02/2017 Impression:     - Normal esophagus. Biopsied.            - 5 cm hiatal hernia.            - Normal stomach. Biopsied.            - Normal examined duodenum. Biopsied. 1.  Duodenum, biopsy:      -Unremarkable duodenal tissue.      -No celiac disease, dysplasia or malignancy.  2.  Stomach, biopsy:      -Unremarkable fundic type gastric mucosa.      -  No Helicobacter organisms on H/E stain.      -No dysplasia or malignancy.  3.  Esophagus, biopsy:      -Unremarkable squamous mucosa.      -No gastric mucosa.      -No eosinophilic esophagitis, dysplasia or malignancy.  Colonoscopy 10/30/2017 Findings:    The perianal and digital rectal examinations were normal.    The entire examined colon appeared normal on direct and retroflexion     views.   Past Medical History:  Diagnosis Date  . Hiatal hernia   . Multiple gastric ulcers     Past Surgical History:  Procedure Laterality Date  . CHOLECYSTECTOMY       Current Outpatient Medications:  .  pantoprazole (PROTONIX) 40 MG tablet, Take 1 tablet (40 mg total) by mouth daily before breakfast., Disp: 90 tablet, Rfl: 0 .  cyclobenzaprine (FLEXERIL) 10 MG tablet, Take 1 tablet (10 mg total) by mouth 3 (three) times daily as needed for muscle spasms., Disp: 30 tablet, Rfl: 0 .  gabapentin (NEURONTIN) 100 MG capsule, Start with 100mg  three times daily by mouth for one week.  If symptoms are not improved, you may then increase the dose to 200mg  by mouth three times daily.  Remain at this dose until you follow up with primary care. (Patient not taking: Reported on 02/14/2018), Disp: 60 capsule, Rfl: 0 .  naproxen (NAPROSYN) 500 MG tablet, Take 1 tablet (500 mg total) by mouth 2 (two) times daily with a meal. (Patient not taking: Reported on 02/14/2018), Disp: 20 tablet, Rfl: 0 .  peg 3350 powder (MOVIPREP) 100 g SOLR, Take by mouth., Disp: , Rfl:     Family History  Problem Relation Age of Onset  . Dementia Mother   . Dementia Father      Social History   Tobacco Use  . Smoking status: Never Smoker  . Smokeless tobacco: Never Used  Substance Use Topics  . Alcohol use: No  . Drug use: No    Allergies as of 02/14/2018 - Review Complete 02/14/2018  Allergen Reaction Noted  . Gluten meal  08/17/2006  . Methylprednisolone Other (See Comments) 12/23/2017    Review of Systems:    All systems reviewed and negative except where noted in HPI.   Physical Exam:  BP 122/77 (BP Location: Left Arm, Patient Position: Sitting, Cuff Size: Large)   Pulse 71   Resp 17   Ht 5\' 8"  (1.727 m)   Wt 212 lb 12.8 oz (96.5 kg)   BMI 32.36 kg/m  No LMP recorded. Patient is postmenopausal.  General:   Alert,  Well-developed, well-nourished, pleasant and cooperative in NAD Head:  Normocephalic and atraumatic. Eyes:  Sclera clear, no icterus.   Conjunctiva pink. Ears:  Normal auditory acuity. Nose:  No deformity, discharge, or lesions. Mouth:  No deformity or  lesions,oropharynx pink & moist. Neck:  Supple; no masses or thyromegaly. Lungs:  Respirations even and unlabored.  Clear throughout to auscultation.   No wheezes, crackles, or rhonchi. No acute distress. Heart:  Regular rate and rhythm; no murmurs, clicks, rubs, or gallops. Abdomen:  Normal bowel sounds. Soft, non-tender and non-distended without masses, hepatosplenomegaly or hernias noted.  No guarding or rebound tenderness.   Rectal: Not performed Msk:  Symmetrical without gross deformities. Good, equal movement & strength bilaterally. Pulses:  Normal pulses noted. Extremities:  No clubbing or edema.  No cyanosis. Neurologic:  Alert and oriented x3;  grossly normal neurologically. Skin:  Intact without significant lesions or rashes.  No jaundice. Psych:  Alert and cooperative. Normal mood and affect.  Imaging Studies: Reviewed  Assessment and Plan:   Donald Jacque is a 63 y.o. Caucasian female with no significant past medical history, obesity who presents with 46-months history of left lateral lower back pain after doing some heavy weight work.  She had extensive GI work-up which was unremarkable.  It is most likely musculoskeletal pain, paraspinal muscle strain or spasm.  I do not suspect that her symptoms are secondary to hiatal hernia  Patient is new to this area and trying to find a primary care provider  Will try Flexeril, recommended her to see an orthopedic Continue Protonix for heartburn I do not recommend any further GI work-up at this time She will try to establish care with primary care provider  Follow up as needed   [name], MD

## 2018-03-21 ENCOUNTER — Ambulatory Visit
Admission: EM | Admit: 2018-03-21 | Discharge: 2018-03-21 | Disposition: A | Discharge status: Home / Self Care | Payer: BLUE CROSS/BLUE SHIELD | Attending: Family Medicine | Admitting: Family Medicine

## 2018-03-21 ENCOUNTER — Ambulatory Visit (INDEPENDENT_AMBULATORY_CARE_PROVIDER_SITE_OTHER): Payer: BLUE CROSS/BLUE SHIELD

## 2018-03-21 ENCOUNTER — Encounter: Payer: Self-pay | Admitting: Emergency Medicine

## 2018-03-21 ENCOUNTER — Other Ambulatory Visit: Payer: Self-pay

## 2018-03-21 DIAGNOSIS — M79671 Pain in right foot: Secondary | ICD-10-CM

## 2018-03-21 MED ORDER — MELOXICAM 15 MG PO TABS: 15.0000 mg | ORAL_TABLET | Freq: Every day | ORAL | 0 refills | Status: DC | PRN

## 2018-03-21 NOTE — ED Provider Notes (Signed)
[facility]    CSN: [id number] Arrival date & time: 03/21/18  1311  History   Chief Complaint Foot pain HPI 63 year old female presents with right foot pain.  Patient states that she hit her foot on a bed frame approximately 2 weeks ago.  She had extensive bruising on the dorsum of her foot.  She states that the bruising is now improving.  However, she continues to have pain particularly on the dorsum of her foot at the level of the MTP joints.  Moderate in severity. She also reports pain on the plantar aspect of the foot at the same area.  Patient reports painful ambulation.  She has rested and iced the area without improvement.  No other associated symptoms.  No other complaints.  PMH, Surgical Hx, Family Hx, Social History reviewed and updated as below.  Past Medical History:  Diagnosis Date  . Hiatal hernia   . Multiple gastric ulcers    Patient Active Problem List   Diagnosis Date Noted  . History of cholecystectomy 01/28/2010   Past Surgical History:  Procedure Laterality Date  . CHOLECYSTECTOMY     OB History   No obstetric history on file.    Home Medications    Prior to Admission medications   Medication Sig Start Date End Date Taking? Authorizing Provider  cyclobenzaprine (FLEXERIL) 10 MG tablet Take 1 tablet (10 mg total) by mouth 3 (three) times daily as needed for muscle spasms. 02/14/18  Yes Vanga, Tally Due, MD  gabapentin (NEURONTIN) 100 MG capsule Start with 100mg  three times daily by mouth for one week.  If symptoms are not improved, you may then increase the dose to 200mg  by mouth three times daily.  Remain at this dose until you follow up with primary care. 12/23/17  Yes Carrie Mew, MD  pantoprazole (PROTONIX) 40 MG tablet Take 1 tablet (40 mg total) by mouth daily before breakfast. 02/14/18 05/15/18 Yes Vanga, Tally Due, MD  meloxicam (MOBIC) 15 MG tablet Take 1 tablet (15 mg total) by mouth daily as needed for pain. 03/21/18   Thersa Salt G, DO  peg 3350 powder (MOVIPREP) 100 g SOLR Take by mouth. 10/18/17   [provider]    Family History Family History  Problem Relation Age of Onset  . Dementia Mother   . Dementia Father     Social History Social History   Tobacco Use  . Smoking status: Never Smoker  . Smokeless tobacco: Never Used  Substance Use Topics  . Alcohol use: No  . Drug use: No     Allergies   Gluten meal and Methylprednisolone   Review of Systems Review of Systems  Constitutional: Negative.   Musculoskeletal:       Foot pain (right)   Physical Exam Triage Vital Signs ED Triage Vitals  Enc Vitals Group     BP 03/21/18 1336 122/85     Pulse Rate 03/21/18 1336 73     Resp 03/21/18 1336 18     Temp 03/21/18 1336 97.9 F (36.6 C)     Temp Source 03/21/18 1336 Oral     SpO2 03/21/18 1336 100 %     Weight 03/21/18 1333 210 lb (95.3 kg)     Height 03/21/18 1333 5\' 8"  (1.727 m)     Head Circumference --      Peak Flow --      Pain Score 03/21/18 1332 5     Pain Loc --  Pain Edu? --      Excl. in [place]? --    Updated Vital Signs BP 122/85   Pulse 73   Temp 97.9 F (36.6 C) (Oral)   Resp 18   Ht 5\' 8"  (1.727 m)   Wt 95.3 kg   SpO2 100%   BMI 31.93 kg/m   Visual Acuity Right Eye Distance:   Left Eye Distance:   Bilateral Distance:    Right Eye Near:   Left Eye Near:    Bilateral Near:     Physical Exam Vitals signs and nursing note reviewed.  Constitutional:      General: She is not in acute distress.    Appearance: Normal appearance.  HENT:     Head: Normocephalic and atraumatic.  Eyes:     General:        Right eye: No discharge.        Left eye: No discharge.     Conjunctiva/sclera: Conjunctivae normal.  Pulmonary:     Effort: Pulmonary effort is normal. No respiratory distress.  Musculoskeletal:     Comments: Right foot -tenderness at the level of the MTP joints diffusely.  No appreciable bruising at this time.  Right ankle -nontender.  No  effusion.  Neurological:     Mental Status: She is alert.  Psychiatric:        Mood and Affect: Mood normal.        Behavior: Behavior normal.    UC Treatments / Results  Labs (all labs ordered are listed, but only abnormal results are displayed) Labs Reviewed - No data to display  EKG None  Radiology Dg Foot Complete Right  Result Date: 03/21/2018 CLINICAL DATA:  63 year old female status post blunt trauma when struck foot on bed frame 2 weeks ago with continued pain and bruising. EXAM: RIGHT FOOT COMPLETE - 3+ VIEW COMPARISON:  06/02/2017. FINDINGS: There is no evidence of fracture or dislocation. There is no evidence of arthropathy or other focal bone abnormality. No discrete soft tissue injury. IMPRESSION: Stable and negative. Electronically Signed   By: Genevie Ann M.D.   On: 03/21/2018 14:18    Procedures Procedures (including critical care time)  Medications Ordered in UC Medications - No data to display  Initial Impression / Assessment and Plan / UC Course  I have reviewed the triage vital signs and the nursing notes.  Pertinent labs & imaging results that were available during my care of the patient were reviewed by me and considered in my medical decision making (see chart for details).    63 year old female presents with foot pain after suffering injury.  X-ray negative.  Meloxicam as needed.  Placed in a postop shoe for comfort.  Final Clinical Impressions(s) / UC Diagnoses   Final diagnoses:  Right foot pain     Discharge Instructions     Rest, elevation.  Medication as prescribed.   Post op shoe for comfort.  Take care  Dr. Lacinda Axon    ED Prescriptions    Medication Sig Dispense Auth. Provider   meloxicam (MOBIC) 15 MG tablet Take 1 tablet (15 mg total) by mouth daily as needed for pain. 30 tablet Coral Spikes, DO     Controlled Substance Prescriptions [place] Controlled Substance Registry consulted? Not Applicable   Coral Spikes, DO 03/21/18  1506

## 2018-03-21 NOTE — ED Triage Notes (Signed)
Patient states that she hit her foot on a bed frame about 2 weeks ago and it is still very painful and bruised

## 2018-03-21 NOTE — Discharge Instructions (Signed)
Rest, elevation.  Medication as prescribed.   Post op shoe for comfort.  Take care  Dr. Lacinda Axon

## 2018-04-10 DIAGNOSIS — I213 ST elevation (STEMI) myocardial infarction of unspecified site: Secondary | ICD-10-CM | POA: Insufficient documentation

## 2018-04-10 DIAGNOSIS — I252 Old myocardial infarction: Secondary | ICD-10-CM | POA: Insufficient documentation

## 2018-04-10 HISTORY — DX: ST elevation (STEMI) myocardial infarction of unspecified site: I21.3

## 2018-05-08 ENCOUNTER — Other Ambulatory Visit: Payer: Self-pay

## 2018-05-08 ENCOUNTER — Encounter: Payer: Self-pay | Admitting: Family Medicine

## 2018-05-08 ENCOUNTER — Ambulatory Visit: Payer: BLUE CROSS/BLUE SHIELD | Admitting: Family Medicine

## 2018-05-08 VITALS — BP 110/64 | HR 72 | Ht 68.0 in | Wt 214.0 lb

## 2018-05-08 DIAGNOSIS — Z7689 Persons encountering health services in other specified circumstances: Secondary | ICD-10-CM | POA: Diagnosis not present

## 2018-05-08 NOTE — Patient Instructions (Signed)
This information is directly available on the CDC website: https://www.cdc.gov/coronavirus/2019-ncov/if-you-are-sick/steps-when-sick.html    Source:CDC Reference to specific commercial products, manufacturers, companies, or trademarks does not constitute its endorsement or recommendation by the U.S. Government, Department of Health and Human Services, or Centers for Disease Control and Prevention.  

## 2018-05-08 NOTE — Progress Notes (Signed)
Date:  05/08/2018   Name:  Joanne Clark   DOB:  [birth year]   MRN:  [id number]   Chief Complaint: Establish Care  Patient is a 63 year old female who presents for a comprehensive physical exam. The patient reports the following problems: none. Health maintenance has been reviewed up to date.   Review of Systems  Constitutional: Negative.  Negative for chills, fatigue, fever and unexpected weight change.  HENT: Negative for congestion, ear discharge, ear pain, hearing loss, mouth sores, nosebleeds, postnasal drip, rhinorrhea, sinus pressure, sneezing and sore throat.   Eyes: Negative for photophobia, pain, discharge, redness and itching.  Respiratory: Negative for apnea, cough, choking, chest tightness, shortness of breath, wheezing and stridor.   Cardiovascular: Negative for chest pain, palpitations and leg swelling.  Gastrointestinal: Negative for abdominal distention, abdominal pain, blood in stool, constipation, diarrhea, nausea and vomiting.  Endocrine: Negative for cold intolerance, heat intolerance, polydipsia, polyphagia and polyuria.  Genitourinary: Negative for dysuria, enuresis, flank pain, frequency, hematuria, menstrual problem, pelvic pain, urgency, vaginal bleeding and vaginal discharge.  Musculoskeletal: Negative for arthralgias, back pain and myalgias.  Skin: Negative for rash.  Allergic/Immunologic: Negative for environmental allergies and food allergies.  Neurological: Negative for dizziness, syncope, speech difficulty, weakness, light-headedness, numbness and headaches.  Hematological: Negative for adenopathy. Does not bruise/bleed easily.  Psychiatric/Behavioral: Negative for dysphoric mood. The patient is not nervous/anxious.     Patient Active Problem List   Diagnosis Date Noted   STEMI (ST elevation myocardial infarction) ([place]) 04/10/2018   History of cholecystectomy 01/28/2010    Allergies  Allergen Reactions   Gluten Meal     Other reaction(s): GI  Reaction   Methylprednisolone Other (See Comments)    dizzy    Past Surgical History:  Procedure Laterality Date   CHOLECYSTECTOMY     COLONOSCOPY W/ ENDOSCOPIC [place]  10/17/2017    Social History   Tobacco Use   Smoking status: Never Smoker   Smokeless tobacco: Never Used  Substance Use Topics   Alcohol use: No   Drug use: No     Medication list has been reviewed and updated.  Current Meds  Medication Sig   apixaban (ELIQUIS) 5 MG TABS tablet Take 1 tablet by mouth 2 (two) times daily. cardio   atorvastatin (LIPITOR) 80 MG tablet Take 1 tablet by mouth daily. Dr Dorna Bloom cardio   furosemide (LASIX) 20 MG tablet Take by mouth as needed. cardio   metoprolol succinate (TOPROL-XL) 25 MG 24 hr tablet Take 12.5 mg by mouth daily. cardio   nitroGLYCERIN (NITROSTAT) 0.4 MG SL tablet Place under the tongue. cardio   pantoprazole (PROTONIX) 20 MG tablet Take 1 tablet by mouth daily. cardio   prasugrel (EFFIENT) 10 MG TABS tablet Take 1 tablet by mouth daily. cardio   [DISCONTINUED] gabapentin (NEURONTIN) 100 MG capsule Start with 100mg  three times daily by mouth for one week.  If symptoms are not improved, you may then increase the dose to 200mg  by mouth three times daily.  Remain at this dose until you follow up with primary care.   [DISCONTINUED] pantoprazole (PROTONIX) 40 MG tablet Take 1 tablet (40 mg total) by mouth daily before breakfast. (Patient taking differently: Take 20 mg by mouth daily before breakfast. )    PHQ 2/9 Scores 05/08/2018  PHQ - 2 Score 1  PHQ- 9 Score 1    BP Readings from Last 3 Encounters:  05/08/18 110/64  03/21/18 122/85  02/14/18 122/77    Physical  Exam Vitals signs and nursing note reviewed.  Constitutional:      General: She is not in acute distress.    Appearance: She is normal weight. She is not diaphoretic.  HENT:     Head: Normocephalic and atraumatic.     Right Ear: Tympanic membrane, ear canal and external ear normal.       Left Ear: Tympanic membrane, ear canal and external ear normal.     Nose: Nose normal. No congestion or rhinorrhea.  Eyes:     General:        Right eye: No discharge.        Left eye: No discharge.     Conjunctiva/sclera: Conjunctivae normal.     Pupils: Pupils are equal, round, and reactive to light.  Neck:     Musculoskeletal: Normal range of motion and neck supple.     Thyroid: No thyromegaly.     Vascular: No JVD.  Cardiovascular:     Rate and Rhythm: Normal rate and regular rhythm.     Heart sounds: Normal heart sounds. No murmur. No friction rub. No gallop.   Pulmonary:     Effort: Pulmonary effort is normal.     Breath sounds: Normal breath sounds. No wheezing, rhonchi or rales.  Chest:     Chest wall: No tenderness.  Abdominal:     General: Bowel sounds are normal.     Palpations: Abdomen is soft. There is no mass.     Tenderness: There is no abdominal tenderness. There is no right CVA tenderness or guarding.  Musculoskeletal: Normal range of motion.  Lymphadenopathy:     Cervical: No cervical adenopathy.  Skin:    General: Skin is warm and dry.  Neurological:     Mental Status: She is alert.     Deep Tendon Reflexes: Reflexes are normal and symmetric.     Wt Readings from Last 3 Encounters:  05/08/18 214 lb (97.1 kg)  03/21/18 210 lb (95.3 kg)  02/14/18 212 lb 12.8 oz (96.5 kg)    BP 110/64    Pulse 72    Ht 5\' 8"  (1.727 m)    Wt 214 lb (97.1 kg)    BMI 32.54 kg/m   Assessment and Plan:  1. Establishing care with new doctor, encounter for Patient was establish care with new physician.  Patient's chart was reviewed for previous admission at [facility] for the elevation myocardial infarction.  Patient's chart was also reviewed for previous labs and imaging.  Discussed her medications and these will be continued and patient has now established and was encouraged to call us under any circumstances for recurrence of chest pain or similar circumstances.

## 2018-05-20 DIAGNOSIS — K449 Diaphragmatic hernia without obstruction or gangrene: Secondary | ICD-10-CM | POA: Insufficient documentation

## 2018-05-20 DIAGNOSIS — I499 Cardiac arrhythmia, unspecified: Secondary | ICD-10-CM | POA: Diagnosis not present

## 2018-05-20 DIAGNOSIS — I252 Old myocardial infarction: Secondary | ICD-10-CM | POA: Diagnosis not present

## 2018-05-20 DIAGNOSIS — R072 Precordial pain: Secondary | ICD-10-CM | POA: Diagnosis not present

## 2018-05-20 DIAGNOSIS — R531 Weakness: Secondary | ICD-10-CM | POA: Diagnosis not present

## 2018-05-20 DIAGNOSIS — R110 Nausea: Secondary | ICD-10-CM | POA: Diagnosis not present

## 2018-05-20 DIAGNOSIS — Z955 Presence of coronary angioplasty implant and graft: Secondary | ICD-10-CM | POA: Diagnosis not present

## 2018-05-20 DIAGNOSIS — R079 Chest pain, unspecified: Secondary | ICD-10-CM | POA: Diagnosis not present

## 2018-05-20 DIAGNOSIS — K279 Peptic ulcer, site unspecified, unspecified as acute or chronic, without hemorrhage or perforation: Secondary | ICD-10-CM | POA: Insufficient documentation

## 2018-05-20 DIAGNOSIS — I455 Other specified heart block: Secondary | ICD-10-CM | POA: Diagnosis not present

## 2018-05-20 DIAGNOSIS — I5022 Chronic systolic (congestive) heart failure: Secondary | ICD-10-CM | POA: Diagnosis not present

## 2018-05-20 DIAGNOSIS — Z79899 Other long term (current) drug therapy: Secondary | ICD-10-CM | POA: Diagnosis not present

## 2018-05-20 DIAGNOSIS — G43909 Migraine, unspecified, not intractable, without status migrainosus: Secondary | ICD-10-CM | POA: Insufficient documentation

## 2018-05-20 DIAGNOSIS — I4891 Unspecified atrial fibrillation: Secondary | ICD-10-CM | POA: Insufficient documentation

## 2018-05-20 DIAGNOSIS — E785 Hyperlipidemia, unspecified: Secondary | ICD-10-CM | POA: Diagnosis not present

## 2018-05-20 DIAGNOSIS — I2542 Coronary artery dissection: Secondary | ICD-10-CM | POA: Diagnosis not present

## 2018-05-20 DIAGNOSIS — I5042 Chronic combined systolic (congestive) and diastolic (congestive) heart failure: Secondary | ICD-10-CM | POA: Insufficient documentation

## 2018-05-20 DIAGNOSIS — I491 Atrial premature depolarization: Secondary | ICD-10-CM | POA: Diagnosis not present

## 2018-05-20 DIAGNOSIS — R1012 Left upper quadrant pain: Secondary | ICD-10-CM | POA: Diagnosis not present

## 2018-05-20 DIAGNOSIS — R55 Syncope and collapse: Secondary | ICD-10-CM | POA: Diagnosis not present

## 2018-05-20 DIAGNOSIS — K828 Other specified diseases of gallbladder: Secondary | ICD-10-CM | POA: Diagnosis not present

## 2018-05-20 DIAGNOSIS — Z8679 Personal history of other diseases of the circulatory system: Secondary | ICD-10-CM | POA: Diagnosis not present

## 2018-05-20 DIAGNOSIS — I5020 Unspecified systolic (congestive) heart failure: Secondary | ICD-10-CM | POA: Diagnosis not present

## 2018-05-21 DIAGNOSIS — I252 Old myocardial infarction: Secondary | ICD-10-CM | POA: Diagnosis not present

## 2018-05-21 DIAGNOSIS — I517 Cardiomegaly: Secondary | ICD-10-CM | POA: Diagnosis not present

## 2018-05-21 DIAGNOSIS — I5020 Unspecified systolic (congestive) heart failure: Secondary | ICD-10-CM | POA: Diagnosis not present

## 2018-05-21 DIAGNOSIS — R079 Chest pain, unspecified: Secondary | ICD-10-CM | POA: Diagnosis not present

## 2018-05-21 MED ORDER — GENERIC EXTERNAL MEDICATION
2.00 | Status: DC
Start: 2018-05-21 — End: 2018-05-21

## 2018-05-21 MED ORDER — ACETAMINOPHEN 500 MG PO TABS
1000.00 | ORAL_TABLET | ORAL | Status: DC
Start: ? — End: 2018-05-21

## 2018-05-21 MED ORDER — METOPROLOL SUCCINATE ER 25 MG PO TB24
25.00 | ORAL_TABLET | ORAL | Status: DC
Start: 2018-05-22 — End: 2018-05-21

## 2018-05-21 MED ORDER — PRASUGREL HCL 10 MG PO TABS
10.00 | ORAL_TABLET | ORAL | Status: DC
Start: 2018-05-22 — End: 2018-05-21

## 2018-05-21 MED ORDER — PANTOPRAZOLE SODIUM 20 MG PO TBEC
20.00 | DELAYED_RELEASE_TABLET | ORAL | Status: DC
Start: 2018-05-22 — End: 2018-05-21

## 2018-05-21 MED ORDER — ATORVASTATIN CALCIUM 80 MG PO TABS
80.00 | ORAL_TABLET | ORAL | Status: DC
Start: 2018-05-21 — End: 2018-05-21

## 2018-05-21 MED ORDER — LISINOPRIL 5 MG PO TABS
5.00 | ORAL_TABLET | ORAL | Status: DC
Start: 2018-05-22 — End: 2018-05-21

## 2018-05-21 MED ORDER — POLYETHYLENE GLYCOL 3350 17 G PO PACK
17.00 | PACK | ORAL | Status: DC
Start: 2018-05-22 — End: 2018-05-21

## 2018-05-21 MED ORDER — LIDOCAINE 5 % EX PTCH
1.00 | MEDICATED_PATCH | CUTANEOUS | Status: DC
Start: ? — End: 2018-05-21

## 2018-05-21 MED ORDER — APIXABAN 5 MG PO TABS
5.00 | ORAL_TABLET | ORAL | Status: DC
Start: 2018-05-21 — End: 2018-05-21

## 2018-06-13 DIAGNOSIS — I2102 ST elevation (STEMI) myocardial infarction involving left anterior descending coronary artery: Secondary | ICD-10-CM | POA: Diagnosis not present

## 2018-06-13 DIAGNOSIS — Z9861 Coronary angioplasty status: Secondary | ICD-10-CM | POA: Diagnosis not present

## 2018-06-13 DIAGNOSIS — Z955 Presence of coronary angioplasty implant and graft: Secondary | ICD-10-CM | POA: Diagnosis not present

## 2018-07-25 DIAGNOSIS — Z9049 Acquired absence of other specified parts of digestive tract: Secondary | ICD-10-CM | POA: Diagnosis not present

## 2018-07-25 DIAGNOSIS — Z79899 Other long term (current) drug therapy: Secondary | ICD-10-CM | POA: Diagnosis not present

## 2018-07-25 DIAGNOSIS — I5042 Chronic combined systolic (congestive) and diastolic (congestive) heart failure: Secondary | ICD-10-CM | POA: Diagnosis not present

## 2018-07-25 DIAGNOSIS — I480 Paroxysmal atrial fibrillation: Secondary | ICD-10-CM | POA: Diagnosis not present

## 2018-07-25 DIAGNOSIS — I252 Old myocardial infarction: Secondary | ICD-10-CM | POA: Diagnosis not present

## 2018-07-25 DIAGNOSIS — I2510 Atherosclerotic heart disease of native coronary artery without angina pectoris: Secondary | ICD-10-CM | POA: Diagnosis not present

## 2018-07-25 DIAGNOSIS — E785 Hyperlipidemia, unspecified: Secondary | ICD-10-CM | POA: Diagnosis not present

## 2018-07-25 DIAGNOSIS — Z7901 Long term (current) use of anticoagulants: Secondary | ICD-10-CM | POA: Diagnosis not present

## 2018-07-25 DIAGNOSIS — R9431 Abnormal electrocardiogram [ECG] [EKG]: Secondary | ICD-10-CM | POA: Diagnosis not present

## 2018-07-25 DIAGNOSIS — I255 Ischemic cardiomyopathy: Secondary | ICD-10-CM | POA: Diagnosis not present

## 2018-10-18 ENCOUNTER — Ambulatory Visit: Payer: BLUE CROSS/BLUE SHIELD | Admitting: Family Medicine

## 2018-10-22 ENCOUNTER — Ambulatory Visit (INDEPENDENT_AMBULATORY_CARE_PROVIDER_SITE_OTHER): Payer: Medicare Other | Admitting: Family Medicine

## 2018-10-22 ENCOUNTER — Other Ambulatory Visit: Payer: Self-pay

## 2018-10-22 ENCOUNTER — Encounter: Payer: Self-pay | Admitting: Family Medicine

## 2018-10-22 VITALS — BP 98/62 | HR 80 | Ht 68.0 in | Wt 225.0 lb

## 2018-10-22 DIAGNOSIS — I255 Ischemic cardiomyopathy: Secondary | ICD-10-CM

## 2018-10-22 DIAGNOSIS — Z23 Encounter for immunization: Secondary | ICD-10-CM | POA: Diagnosis not present

## 2018-10-22 DIAGNOSIS — K219 Gastro-esophageal reflux disease without esophagitis: Secondary | ICD-10-CM

## 2018-10-22 MED ORDER — PANTOPRAZOLE SODIUM 40 MG PO TBEC: 40.0000 mg | DELAYED_RELEASE_TABLET | Freq: Every day | ORAL | 1 refills | Status: DC

## 2018-10-22 NOTE — Progress Notes (Signed)
Date:  10/22/2018   Name:  Joanne Clark   DOB:  06/17/1955   MRN:  [id number]   Chief Complaint: Follow-up (PHQ9=3 and GAD7=4)  Gastroesophageal Reflux She complains of chest pain. She reports no abdominal pain, no belching, no choking, no coughing, no dysphagia, no early satiety, no globus sensation, no heartburn, no hoarse voice, no nausea, no sore throat, no stridor, no tooth decay, no water brash or no wheezing. This is a recurrent problem. The current episode started more than 1 year ago. The problem occurs frequently. The problem has been waxing and waning. The symptoms are aggravated by certain foods. Pertinent negatives include no anemia, fatigue, melena, muscle weakness, orthopnea or weight loss. There are no known risk factors. She has tried a PPI for the symptoms. The treatment provided mild relief. Past procedures do not include an abdominal ultrasound, an EGD, esophageal manometry, esophageal pH monitoring, H. pylori antibody titer or a UGI.  Heart Problem This is a new problem. The current episode started more than 1 month ago. The problem occurs intermittently. The problem has been waxing and waning. Associated symptoms include chest pain. Pertinent negatives include no abdominal pain, anorexia, arthralgias, change in bowel habit, chills, congestion, coughing, fatigue, fever, headaches, joint swelling, myalgias, nausea, neck pain, numbness, rash, sore throat, swollen glands, urinary symptoms, vertigo, vomiting or weakness. The treatment provided mild relief.    Review of Systems  Constitutional: Negative.  Negative for chills, fatigue, fever, unexpected weight change and weight loss.  HENT: Negative for congestion, ear discharge, ear pain, hoarse voice, rhinorrhea, sinus pressure, sneezing and sore throat.   Eyes: Negative for photophobia, pain, discharge, redness and itching.  Respiratory: Negative for cough, choking, shortness of breath, wheezing and stridor.    Cardiovascular: Positive for chest pain.  Gastrointestinal: Negative for abdominal pain, anorexia, blood in stool, change in bowel habit, constipation, diarrhea, dysphagia, heartburn, melena, nausea and vomiting.  Endocrine: Negative for cold intolerance, heat intolerance, polydipsia, polyphagia and polyuria.  Genitourinary: Negative for dysuria, flank pain, frequency, hematuria, menstrual problem, pelvic pain, urgency, vaginal bleeding and vaginal discharge.  Musculoskeletal: Negative for arthralgias, back pain, joint swelling, myalgias, muscle weakness and neck pain.  Skin: Negative for rash.  Allergic/Immunologic: Negative for environmental allergies and food allergies.  Neurological: Negative for dizziness, vertigo, weakness, light-headedness, numbness and headaches.  Hematological: Negative for adenopathy. Does not bruise/bleed easily.  Psychiatric/Behavioral: Negative for dysphoric mood. The patient is not nervous/anxious.     Patient Active Problem List   Diagnosis Date Noted  . Atrial fibrillation ([place]) 05/20/2018  . Hiatal hernia 05/20/2018  . Migraine headache 05/20/2018  . STEMI (ST elevation myocardial infarction) ([place]) 04/10/2018  . History of ST elevation myocardial infarction (STEMI) 04/10/2018  . History of cholecystectomy 01/28/2010    Allergies  Allergen Reactions  . Gluten Meal     Other reaction(s): GI Reaction  . Methylprednisolone Other (See Comments)    dizzy    Past Surgical History:  Procedure Laterality Date  . CHOLECYSTECTOMY    . COLONOSCOPY W/ ENDOSCOPIC [place]  10/17/2017    Social History   Tobacco Use  . Smoking status: Never Smoker  . Smokeless tobacco: Never Used  Substance Use Topics  . Alcohol use: No  . Drug use: No     Medication list has been reviewed and updated.  Current Meds  Medication Sig  . apixaban (ELIQUIS) 5 MG TABS tablet Take 1 tablet by mouth 2 (two) times daily. cardio  . atorvastatin (LIPITOR)  80 MG tablet Take 1  tablet by mouth daily. Dr Claud Kelp [facility] cardio  . furosemide (LASIX) 20 MG tablet Take by mouth as needed. cardio  . lisinopril (ZESTRIL) 5 MG tablet Take 1 tablet by mouth daily. cardio  . metoprolol succinate (TOPROL-XL) 25 MG 24 hr tablet Take 12.5 mg by mouth daily. cardio  . nitroGLYCERIN (NITROSTAT) 0.4 MG SL tablet Place under the tongue. cardio  . pantoprazole (PROTONIX) 20 MG tablet Take 1 tablet by mouth daily. cardio  . prasugrel (EFFIENT) 10 MG TABS tablet Take 1 tablet by mouth daily. cardio    PHQ 2/9 Scores 10/22/2018 05/08/2018  PHQ - 2 Score 1 1  PHQ- 9 Score 3 1    BP Readings from Last 3 Encounters:  10/22/18 98/62  05/08/18 110/64  03/21/18 122/85    Physical Exam Vitals signs and nursing note reviewed.  Constitutional:      Appearance: She is well-developed.  HENT:     Head: Normocephalic.     Right Ear: External ear normal.     Left Ear: External ear normal.  Eyes:     General: Lids are everted, no foreign bodies appreciated. No scleral icterus.       Left eye: No foreign body or hordeolum.     Conjunctiva/sclera: Conjunctivae normal.     Right eye: Right conjunctiva is not injected.     Left eye: Left conjunctiva is not injected.     Pupils: Pupils are equal, round, and reactive to light.  Neck:     Musculoskeletal: Normal range of motion and neck supple.     Thyroid: No thyromegaly.     Vascular: No JVD.     Trachea: No tracheal deviation.  Cardiovascular:     Rate and Rhythm: Normal rate and regular rhythm.     Heart sounds: Normal heart sounds. No murmur. No friction rub. No gallop.   Pulmonary:     Effort: Pulmonary effort is normal. No respiratory distress.     Breath sounds: Normal breath sounds. No stridor. No wheezing, rhonchi or rales.  Abdominal:     General: Bowel sounds are normal.     Palpations: Abdomen is soft. There is no mass.     Tenderness: There is no abdominal tenderness. There is no guarding or rebound.  Musculoskeletal: Normal  range of motion.        General: No tenderness.  Lymphadenopathy:     Cervical: No cervical adenopathy.  Skin:    General: Skin is warm.     Findings: No rash.  Neurological:     Mental Status: She is alert and oriented to person, place, and time.     Cranial Nerves: No cranial nerve deficit.     Deep Tendon Reflexes: Reflexes normal.  Psychiatric:        Mood and Affect: Mood is not anxious or depressed.     Wt Readings from Last 3 Encounters:  10/22/18 225 lb (102.1 kg)  05/08/18 214 lb (97.1 kg)  03/21/18 210 lb (95.3 kg)    BP 98/62   Pulse 80   Ht 5\' 8"  (1.727 m)   Wt 225 lb (102.1 kg)   SpO2 98%   BMI 34.21 kg/m    Assessment and Plan: 1. Ischemic cardiomyopathy Patient has ischemic cardiomyopathy for which she is being followed by [facility].  Patient was unable to undergo standard cardiac rehabilitation because of the COVID situation.  Patient is going to be referred back to cardiac rehab because  she would like to start an exercise program but is concerned about the gradual incorporation of this in her pattern.  We will refer to cardiac rehab cardiologist for instructions. - Amb Referral to Cardiac Rehabilitation  2. Gastroesophageal reflux disease, unspecified whether esophagitis present Patient has a history of reflux.  Will continue pantoprazole 40 mg once a day. - pantoprazole (PROTONIX) 40 MG tablet; Take 1 tablet (40 mg total) by mouth daily.  Dispense: 30 tablet; Refill: 1  3. Influenza vaccine needed Discussed and administered - Flu Vaccine QUAD High Dose(Fluad)

## 2018-10-22 NOTE — Patient Instructions (Signed)
Cardiac Rehabilitation What is cardiac rehabilitation? Cardiac rehabilitation is a treatment program that helps improve the health and well-being of people who have heart problems. Cardiac rehabilitation includes exercise training, education, and counseling to help you get stronger and return to an active lifestyle. This program can help you get better faster and reduce any future hospital stays. Why might I need cardiac rehabilitation? Cardiac rehabilitation programs can help when you have or have had:  A heart attack.  Heart failure.  Peripheral artery disease.  Coronary artery disease.  Angina.  Lung or breathing problems. Cardiac rehabilitation programs are also used when you have had:  Coronary artery bypass graft surgery.  Heart valve replacement.  Heart stent placement.  Heart transplant.  Aneurysm repair. What are the benefits of cardiac rehabilitation? Cardiac rehabilitation can help you:  Reduce problems like chest pain and trouble breathing.  Change risk factors that contribute to heart disease, such as: ? Smoking. ? High blood pressure. ? High cholesterol. ? Diabetes. ? Being inactive. ? Weighing over 30% more than your ideal weight. ? Diet.  Improve your emotional outlook so you feel: ? More hopeful. ? Better about yourself. ? More confident about taking care of yourself.  Get support from health experts as well as other people with similar problems.  Learn healthy ways to manage stress.  Learn how to manage and understand your medicines.  Teach your family about your condition and how to participate in your recovery. What happens in cardiac rehabilitation? You will be assessed by a cardiac rehabilitation team. They will check your health history and do a physical exam. You may need blood tests, exercise stress tests, and other evaluations to make sure that you are ready to start cardiac rehabilitation. The cardiac rehabilitation team works with  you to make a plan based on your health and goals. Your program will be tailored to fit you and your needs and may change as you progress. You may work with a health care team that includes:  Doctors.  Nurses.  Dietitians.  Psychologists.  Exercise specialists.  Physical and occupational therapists. What are the phases of cardiac rehabilitation? A cardiac rehabilitation program is often divided into phases. You advance from one phase to the next. Phase 1 This phase starts while you are still in the hospital. You may:  Start by walking in your room and then in the hall.  Do some simple exercises with a therapist.  Phase 2 This phase begins when you go home or to another facility. You will travel to a cardiac rehabilitation center or another place where rehabilitation is offered. This phase may last 8-12 weeks. During this phase:  You will slowly increase your activity level while being closely watched by a nurse or therapist.  You will have medical tests and exams to monitor your progress.  Your exercises may include strength or resistance training along with activities that cause your heart to beat faster (aerobic exercises), such as walking on a treadmill.  Your condition will determine how often and how long these sessions last.  You may learn how to: ? Joanne Clark heart-healthy meals. ? Control your blood sugar, if this applies. ? Stop smoking. ? Manage your medicines. You may need help with scheduling or planning how and when to take your medicines. If you have questions about your medicines, it is very important that you talk with your health care provider.  Phase 3 This phase continues for the rest of your life. In this phase:  There  will be less supervision.  You may continue to participate in cardiac rehabilitation activities or become part of a group in your community.  You may benefit from talking about your experience with other people who are facing similar  challenges. Follow these instructions at home:  Take over-the-counter and prescription medicines only as told by your health care provider.  Keep all follow-up visits as told by your health care provider. This is important. Get help right away if:  You have severe chest discomfort, especially if the pain is crushing or pressure-like and spreads to your arms, back, neck, or jaw. Do not wait to see if the pain will go away.  You have weakness or numbness in your face, arms, or legs, especially on one side of the body.  Your speech is slurred.  You are confused.  You have a sudden, severe headache or loss of vision.  You have shortness of breath.  You are sweating and have nausea.  You feel dizzy or faint.  You are fatigued. These symptoms may represent a serious problem that is an emergency. Do not wait to see if the symptoms will go away. Get medical help right away. Call your local emergency services (911 in the [place] Do not drive yourself to the hospital. Summary  Cardiac rehabilitation is a treatment program that helps improve the health and well-being of people who have heart problems.  A cardiac rehabilitation program is often divided into phases. You advance from one phase to the next.  The cardiac rehabilitation team works with you to make a plan based on your health and goals.  Cardiac rehabilitation includes exercise training, education, and counseling to help you get stronger and return to an active lifestyle. This information is not intended to replace advice given to you by your health care provider. Make sure you discuss any questions you have with your health care provider. Document Released: 10/13/2007 Document Revised: 04/25/2018 Document Reviewed: 11/02/2017 Elsevier Patient Education  2020 [employer].

## 2018-11-15 ENCOUNTER — Other Ambulatory Visit: Payer: Self-pay | Admitting: Family Medicine

## 2018-11-15 DIAGNOSIS — K219 Gastro-esophageal reflux disease without esophagitis: Secondary | ICD-10-CM

## 2018-11-16 ENCOUNTER — Telehealth (HOSPITAL_COMMUNITY): Payer: Self-pay | Admitting: Professional

## 2018-11-23 ENCOUNTER — Telehealth (HOSPITAL_COMMUNITY): Payer: Self-pay | Admitting: Professional

## 2018-11-26 ENCOUNTER — Telehealth (HOSPITAL_COMMUNITY): Payer: Self-pay | Admitting: Professional

## 2018-11-30 ENCOUNTER — Telehealth (HOSPITAL_COMMUNITY): Payer: Self-pay | Admitting: Professional

## 2018-12-03 ENCOUNTER — Telehealth (HOSPITAL_COMMUNITY): Payer: Self-pay | Admitting: Professional

## 2018-12-12 ENCOUNTER — Other Ambulatory Visit: Payer: Self-pay | Admitting: Family Medicine

## 2018-12-12 DIAGNOSIS — K219 Gastro-esophageal reflux disease without esophagitis: Secondary | ICD-10-CM

## 2019-01-26 ENCOUNTER — Other Ambulatory Visit: Payer: Self-pay | Admitting: Family Medicine

## 2019-01-26 DIAGNOSIS — K219 Gastro-esophageal reflux disease without esophagitis: Secondary | ICD-10-CM

## 2019-02-13 DIAGNOSIS — I252 Old myocardial infarction: Secondary | ICD-10-CM | POA: Diagnosis not present

## 2019-02-13 DIAGNOSIS — I5042 Chronic combined systolic (congestive) and diastolic (congestive) heart failure: Secondary | ICD-10-CM | POA: Diagnosis not present

## 2019-02-13 DIAGNOSIS — E782 Mixed hyperlipidemia: Secondary | ICD-10-CM | POA: Diagnosis not present

## 2019-02-13 DIAGNOSIS — I480 Paroxysmal atrial fibrillation: Secondary | ICD-10-CM | POA: Diagnosis not present

## 2019-02-14 DIAGNOSIS — E782 Mixed hyperlipidemia: Secondary | ICD-10-CM | POA: Insufficient documentation

## 2019-02-24 ENCOUNTER — Other Ambulatory Visit: Payer: Self-pay | Admitting: Family Medicine

## 2019-02-24 DIAGNOSIS — K219 Gastro-esophageal reflux disease without esophagitis: Secondary | ICD-10-CM

## 2019-02-28 ENCOUNTER — Ambulatory Visit: Payer: Medicare Other | Attending: Internal Medicine

## 2019-02-28 DIAGNOSIS — Z23 Encounter for immunization: Secondary | ICD-10-CM | POA: Insufficient documentation

## 2019-02-28 NOTE — Progress Notes (Signed)
   Covid-19 Vaccination Clinic  Name:  Joanne Clark    MRN: [id number] DOB: 1956-01-11  02/28/2019  [name] was observed post Covid-19 immunization for 15 minutes without incidence. She was provided with Vaccine Information Sheet and instruction to access the V-Safe system.   [name] was instructed to call 911 with any severe reactions post vaccine: Marland Kitchen Difficulty breathing  . Swelling of your face and throat  . A fast heartbeat  . A bad rash all over your body  . Dizziness and weakness    Immunizations Administered    Name Date Dose VIS Date Route   Pfizer COVID-19 Vaccine 02/28/2019  1:54 PM 0.3 mL 12/28/2018 Intramuscular   Manufacturer: [place]   Lot: [id number]   [place]: [id number]

## 2019-03-21 ENCOUNTER — Other Ambulatory Visit: Payer: Self-pay | Admitting: Family Medicine

## 2019-03-21 DIAGNOSIS — K219 Gastro-esophageal reflux disease without esophagitis: Secondary | ICD-10-CM

## 2019-03-23 ENCOUNTER — Ambulatory Visit: Payer: Medicare Other | Attending: Internal Medicine

## 2019-03-23 DIAGNOSIS — Z23 Encounter for immunization: Secondary | ICD-10-CM

## 2019-03-23 NOTE — Progress Notes (Signed)
   Covid-19 Vaccination Clinic  Name:  Joanne Clark    MRN: [id number] DOB: 1955-12-24  03/23/2019  [name] was observed post Covid-19 immunization for 15 minutes without incident. She was provided with Vaccine Information Sheet and instruction to access the V-Safe system.   [name] was instructed to call 911 with any severe reactions post vaccine: Marland Kitchen Difficulty breathing  . Swelling of face and throat  . A fast heartbeat  . A bad rash all over body  . Dizziness and weakness   Immunizations Administered    Name Date Dose VIS Date Route   Pfizer COVID-19 Vaccine 03/23/2019 12:19 PM 0.3 mL 12/28/2018 Intramuscular   Manufacturer: [place]   Lot: [id number]   [place]: [id number]

## 2019-04-24 ENCOUNTER — Other Ambulatory Visit: Payer: Self-pay

## 2019-04-24 ENCOUNTER — Ambulatory Visit (INDEPENDENT_AMBULATORY_CARE_PROVIDER_SITE_OTHER): Payer: Medicare Other | Admitting: Family Medicine

## 2019-04-24 ENCOUNTER — Encounter: Payer: Self-pay | Admitting: Family Medicine

## 2019-04-24 VITALS — BP 100/62 | HR 76 | Ht 68.0 in | Wt 234.0 lb

## 2019-04-24 DIAGNOSIS — K219 Gastro-esophageal reflux disease without esophagitis: Secondary | ICD-10-CM

## 2019-04-24 MED ORDER — PANTOPRAZOLE SODIUM 40 MG PO TBEC: 40.0000 mg | DELAYED_RELEASE_TABLET | Freq: Every day | ORAL | 3 refills | Status: DC

## 2019-04-24 NOTE — Progress Notes (Signed)
Date:  04/24/2019   Name:  Joanne Clark   DOB:  [birth year]   MRN:  [id number]   Chief Complaint: Gastroesophageal Reflux  Gastroesophageal Reflux She complains of heartburn. She reports no abdominal pain, no belching, no chest pain, no choking, no coughing, no dysphagia, no early satiety, no globus sensation, no hoarse voice, no nausea, no sore throat, no stridor, no tooth decay, no water brash or no wheezing. This is a chronic problem. The problem occurs occasionally. The problem has been waxing and waning. The heartburn duration is several minutes. The heartburn is of mild intensity. The heartburn does not wake her from sleep. The heartburn does not limit her activity. The heartburn doesn't change with position. The symptoms are aggravated by certain foods and caffeine (spicey). Pertinent negatives include no anemia, fatigue, melena, muscle weakness, orthopnea or weight loss. She has tried a PPI for the symptoms. The treatment provided moderate relief.    Lab Results  Component Value Date   CREATININE 0.63 12/23/2017   BUN 19 12/23/2017   NA 140 12/23/2017   K 3.9 12/23/2017   CL 107 12/23/2017   CO2 28 12/23/2017   No results found for: CHOL, HDL, LDLCALC, LDLDIRECT, TRIG, CHOLHDL No results found for: TSH No results found for: HGBA1C Lab Results  Component Value Date   WBC 5.8 12/23/2017   HGB 13.7 12/23/2017   HCT 42.6 12/23/2017   MCV 91.2 12/23/2017   PLT 250 12/23/2017   Lab Results  Component Value Date   ALT 31 12/23/2017   AST 24 12/23/2017   ALKPHOS 85 12/23/2017   BILITOT 0.7 12/23/2017     Review of Systems  Constitutional: Negative.  Negative for chills, fatigue, fever, unexpected weight change and weight loss.  HENT: Negative for congestion, ear discharge, ear pain, hoarse voice, rhinorrhea, sinus pressure, sneezing and sore throat.   Eyes: Negative for photophobia, pain, discharge, redness and itching.  Respiratory: Negative for cough, choking,  shortness of breath, wheezing and stridor.   Cardiovascular: Negative for chest pain, palpitations and leg swelling.  Gastrointestinal: Positive for heartburn. Negative for abdominal pain, blood in stool, constipation, diarrhea, dysphagia, melena, nausea and vomiting.  Endocrine: Negative for cold intolerance, heat intolerance, polydipsia, polyphagia and polyuria.  Genitourinary: Negative for dysuria, flank pain, frequency, hematuria, menstrual problem, pelvic pain, urgency, vaginal bleeding and vaginal discharge.  Musculoskeletal: Negative for arthralgias, back pain, myalgias and muscle weakness.  Skin: Negative for rash.  Allergic/Immunologic: Negative for environmental allergies and food allergies.  Neurological: Negative for dizziness, weakness, light-headedness, numbness and headaches.  Hematological: Negative for adenopathy. Does not bruise/bleed easily.  Psychiatric/Behavioral: Negative for dysphoric mood. The patient is not nervous/anxious.     Patient Active Problem List   Diagnosis Date Noted  . Atrial fibrillation ([place]) 05/20/2018  . Hiatal hernia 05/20/2018  . Migraine headache 05/20/2018  . STEMI (ST elevation myocardial infarction) ([place]) 04/10/2018  . History of ST elevation myocardial infarction (STEMI) 04/10/2018  . History of cholecystectomy 01/28/2010    Allergies  Allergen Reactions  . Gluten Meal     Other reaction(s): GI Reaction  . Methylprednisolone Other (See Comments)    dizzy    Past Surgical History:  Procedure Laterality Date  . CHOLECYSTECTOMY    . COLONOSCOPY W/ ENDOSCOPIC [place]  10/17/2017    Social History   Tobacco Use  . Smoking status: Never Smoker  . Smokeless tobacco: Never Used  Substance Use Topics  . Alcohol use: No  . Drug use:  No     Medication list has been reviewed and updated.  Current Meds  Medication Sig  . apixaban (ELIQUIS) 5 MG TABS tablet Take 1 tablet by mouth 2 (two) times daily. cardio  . atorvastatin (LIPITOR) 80  MG tablet Take 1 tablet by mouth daily. Dr Claud Kelp [facility] cardio  . furosemide (LASIX) 20 MG tablet Take by mouth as needed. cardio  . lisinopril (ZESTRIL) 5 MG tablet Take 1 tablet by mouth daily. cardio  . metoprolol succinate (TOPROL-XL) 25 MG 24 hr tablet Take 25 mg by mouth daily. cardio  . nitroGLYCERIN (NITROSTAT) 0.4 MG SL tablet Place under the tongue. cardio  . pantoprazole (PROTONIX) 40 MG tablet TAKE 1 TABLET BY MOUTH EVERY DAY  . prasugrel (EFFIENT) 10 MG TABS tablet Take 1 tablet by mouth daily. cardio    PHQ 2/9 Scores 04/24/2019 10/22/2018 05/08/2018  PHQ - 2 Score 0 1 1  PHQ- 9 Score 0 3 1    BP Readings from Last 3 Encounters:  04/24/19 100/62  10/22/18 98/62  05/08/18 110/64    Physical Exam Vitals and nursing note reviewed.  Constitutional:      Appearance: She is well-developed.  HENT:     Head: Normocephalic.     Right Ear: Tympanic membrane, ear canal and external ear normal.     Left Ear: Tympanic membrane, ear canal and external ear normal.     Nose: Nose normal.     Mouth/Throat:     Mouth: Mucous membranes are moist.  Eyes:     General: Lids are everted, no foreign bodies appreciated. No scleral icterus.       Left eye: No foreign body or hordeolum.     Conjunctiva/sclera: Conjunctivae normal.     Right eye: Right conjunctiva is not injected.     Left eye: Left conjunctiva is not injected.     Pupils: Pupils are equal, round, and reactive to light.  Neck:     Thyroid: No thyromegaly.     Vascular: No carotid bruit or JVD.     Trachea: No tracheal deviation.  Cardiovascular:     Rate and Rhythm: Normal rate and regular rhythm.     Heart sounds: Normal heart sounds. No murmur. No friction rub. No gallop.   Pulmonary:     Effort: Pulmonary effort is normal. No respiratory distress.     Breath sounds: Normal breath sounds. No wheezing, rhonchi or rales.  Abdominal:     General: Bowel sounds are normal.     Palpations: Abdomen is soft. There is no mass.      Tenderness: There is no abdominal tenderness. There is no guarding or rebound.  Musculoskeletal:        General: No tenderness. Normal range of motion.     Cervical back: Normal range of motion and neck supple.  Lymphadenopathy:     Cervical: No cervical adenopathy.  Skin:    General: Skin is warm.     Capillary Refill: Capillary refill takes less than 2 seconds.     Findings: No rash.  Neurological:     Mental Status: She is alert and oriented to person, place, and time.     Cranial Nerves: No cranial nerve deficit.     Deep Tendon Reflexes: Reflexes normal.  Psychiatric:        Mood and Affect: Mood is not anxious or depressed.     Wt Readings from Last 3 Encounters:  04/24/19 234 lb (106.1 kg)  10/22/18 225  lb (102.1 kg)  05/08/18 214 lb (97.1 kg)    BP 100/62   Pulse 76   Ht 5\' 8"  (1.727 m)   Wt 234 lb (106.1 kg)   BMI 35.58 kg/m   Assessment and Plan: 1. Gastroesophageal reflux disease, unspecified whether esophagitis present Chronic.  Controlled.  Stable.  Patient has excellent results with pantoprazole 40 mg once a day for which we will continue. - pantoprazole (PROTONIX) 40 MG tablet; Take 1 tablet (40 mg total) by mouth daily.  Dispense: 90 tablet; Refill: 3                                                                          It was discussed with the patient that she needs to get set up with a gynecologist for her Pap smears and mammogram and she has agreed to do so it is also been discussed that she needs a bone density for which she seems to remember having one in [place] and may be we can get that information in the future.

## 2019-04-24 NOTE — Patient Instructions (Signed)

## 2019-05-15 DIAGNOSIS — E782 Mixed hyperlipidemia: Secondary | ICD-10-CM | POA: Diagnosis not present

## 2019-05-15 DIAGNOSIS — I480 Paroxysmal atrial fibrillation: Secondary | ICD-10-CM | POA: Diagnosis not present

## 2019-05-15 DIAGNOSIS — I252 Old myocardial infarction: Secondary | ICD-10-CM | POA: Diagnosis not present

## 2019-05-15 DIAGNOSIS — I5042 Chronic combined systolic (congestive) and diastolic (congestive) heart failure: Secondary | ICD-10-CM | POA: Diagnosis not present

## 2019-06-04 ENCOUNTER — Ambulatory Visit: Payer: Self-pay

## 2019-06-04 NOTE — Telephone Encounter (Signed)
Pt. Reports she started having upper abdominal pain yesterday that is "getting worse." "Hurts to touch the area." Pain is 7-8/10. Pain is upper abdomen to the left below ribcage. States she has a hiatal hernia "and I feel like that might be the cause." No vomiting, diarrhea or constipation. Denies chest pain. No availability in the practice today. Instructed to got to ED for evaluation. Verbalizes understanding.  Reason for Disposition . [1] MILD-MODERATE pain AND [2] constant AND [3] present > 2 hours  Answer Assessment - Initial Assessment Questions 1. LOCATION: "Where does it hurt?"      Left side, upper side 2. RADIATION: "Does the pain shoot anywhere else?" (e.g., chest, back)     No 3. ONSET: "When did the pain begin?" (e.g., minutes, hours or days ago)      Started yesterday 4. SUDDEN: "Gradual or sudden onset?"     Sudden 5. PATTERN "Does the pain come and go, or is it constant?"    - If constant: "Is it getting better, staying the same, or worsening?"      (Note: Constant means the pain never goes away completely; most serious pain is constant and it progresses)     - If intermittent: "How long does it last?" "Do you have pain now?"     (Note: Intermittent means the pain goes away completely between bouts)     Constant 6. SEVERITY: "How bad is the pain?"  (e.g., Scale 1-10; mild, moderate, or severe)   - MILD (1-3): doesn't interfere with normal activities, abdomen soft and not tender to touch    - MODERATE (4-7): interferes with normal activities or awakens from sleep, tender to touch    - SEVERE (8-10): excruciating pain, doubled over, unable to do any normal activities      7-8 7. RECURRENT SYMPTOM: "Have you ever had this type of abdominal pain before?" If so, ask: "When was the last time?" and "What happened that time?"      No 8. CAUSE: "What do you think is causing the abdominal pain?"     Maybe hiatal hernia 9. RELIEVING/AGGRAVATING FACTORS: "What makes it better or  worse?" (e.g., movement, antacids, bowel movement)     No 10. OTHER SYMPTOMS: "Has there been any vomiting, diarrhea, constipation, or urine problems?"       No 11. PREGNANCY: "Is there any chance you are pregnant?" "When was your last menstrual period?"       No  Protocols used: ABDOMINAL PAIN - [facility]

## 2019-06-05 DIAGNOSIS — R079 Chest pain, unspecified: Secondary | ICD-10-CM | POA: Diagnosis not present

## 2019-06-05 DIAGNOSIS — Z9049 Acquired absence of other specified parts of digestive tract: Secondary | ICD-10-CM | POA: Diagnosis not present

## 2019-06-05 DIAGNOSIS — Z8249 Family history of ischemic heart disease and other diseases of the circulatory system: Secondary | ICD-10-CM | POA: Diagnosis not present

## 2019-06-05 DIAGNOSIS — R1013 Epigastric pain: Secondary | ICD-10-CM | POA: Diagnosis not present

## 2019-06-05 DIAGNOSIS — R10814 Left lower quadrant abdominal tenderness: Secondary | ICD-10-CM | POA: Diagnosis not present

## 2019-06-05 DIAGNOSIS — R10816 Epigastric abdominal tenderness: Secondary | ICD-10-CM | POA: Diagnosis not present

## 2019-06-05 DIAGNOSIS — Z7982 Long term (current) use of aspirin: Secondary | ICD-10-CM | POA: Diagnosis not present

## 2019-06-05 DIAGNOSIS — Z7901 Long term (current) use of anticoagulants: Secondary | ICD-10-CM | POA: Diagnosis not present

## 2019-06-05 DIAGNOSIS — R918 Other nonspecific abnormal finding of lung field: Secondary | ICD-10-CM | POA: Diagnosis not present

## 2019-06-05 DIAGNOSIS — R1012 Left upper quadrant pain: Secondary | ICD-10-CM | POA: Diagnosis not present

## 2019-06-05 DIAGNOSIS — K449 Diaphragmatic hernia without obstruction or gangrene: Secondary | ICD-10-CM | POA: Diagnosis not present

## 2019-06-05 DIAGNOSIS — I2510 Atherosclerotic heart disease of native coronary artery without angina pectoris: Secondary | ICD-10-CM | POA: Diagnosis not present

## 2019-06-05 DIAGNOSIS — I4891 Unspecified atrial fibrillation: Secondary | ICD-10-CM | POA: Diagnosis not present

## 2019-06-05 NOTE — Telephone Encounter (Signed)
ER best

## 2019-06-18 ENCOUNTER — Encounter: Payer: Self-pay | Admitting: Family Medicine

## 2019-06-18 ENCOUNTER — Other Ambulatory Visit: Payer: Self-pay

## 2019-06-18 ENCOUNTER — Ambulatory Visit
Admission: RE | Admit: 2019-06-18 | Discharge: 2019-06-18 | Disposition: A | Discharge status: Home / Self Care | Payer: Medicare Other | Attending: Family Medicine | Admitting: Family Medicine

## 2019-06-18 ENCOUNTER — Ambulatory Visit
Admission: RE | Admit: 2019-06-18 | Discharge: 2019-06-18 | Disposition: A | Discharge status: Home / Self Care | Payer: Medicare Other | Source: Ambulatory Visit | Attending: Family Medicine | Admitting: Family Medicine

## 2019-06-18 ENCOUNTER — Ambulatory Visit (INDEPENDENT_AMBULATORY_CARE_PROVIDER_SITE_OTHER): Payer: Medicare Other | Admitting: Family Medicine

## 2019-06-18 VITALS — BP 140/70 | HR 80 | Ht 68.0 in | Wt 235.0 lb

## 2019-06-18 DIAGNOSIS — R7401 Elevation of levels of liver transaminase levels: Secondary | ICD-10-CM | POA: Diagnosis not present

## 2019-06-18 DIAGNOSIS — J9811 Atelectasis: Secondary | ICD-10-CM | POA: Diagnosis not present

## 2019-06-18 DIAGNOSIS — R9389 Abnormal findings on diagnostic imaging of other specified body structures: Secondary | ICD-10-CM

## 2019-06-18 NOTE — Progress Notes (Signed)
Date:  06/18/2019   Name:  Joanne Clark   DOB:  05-31-1955   MRN:  [id number]   Chief Complaint: Follow-up (hospital- chest xray/ Atelectasis)  Patient is a 64 year old female who presents for a followup  exam. The patient reports the following problems: atelectasis/elevated LFT's. Health maintenance has been reviewed up to date.   Lab Results  Component Value Date   CREATININE 0.63 12/23/2017   BUN 19 12/23/2017   NA 140 12/23/2017   K 3.9 12/23/2017   CL 107 12/23/2017   CO2 28 12/23/2017   No results found for: CHOL, HDL, LDLCALC, LDLDIRECT, TRIG, CHOLHDL No results found for: TSH No results found for: HGBA1C Lab Results  Component Value Date   WBC 5.8 12/23/2017   HGB 13.7 12/23/2017   HCT 42.6 12/23/2017   MCV 91.2 12/23/2017   PLT 250 12/23/2017   Lab Results  Component Value Date   ALT 31 12/23/2017   AST 24 12/23/2017   ALKPHOS 85 12/23/2017   BILITOT 0.7 12/23/2017     Review of Systems  Constitutional: Negative.  Negative for chills, fatigue, fever and unexpected weight change.  HENT: Negative for congestion, ear discharge, ear pain, postnasal drip, rhinorrhea, sinus pressure, sinus pain, sneezing and sore throat.   Eyes: Negative for photophobia, pain, discharge, redness and itching.  Respiratory: Negative for cough, shortness of breath, wheezing and stridor.   Cardiovascular: Negative for chest pain, palpitations and leg swelling.  Gastrointestinal: Negative for abdominal pain, blood in stool, constipation, diarrhea, nausea and vomiting.  Endocrine: Negative for cold intolerance, heat intolerance, polydipsia, polyphagia and polyuria.  Genitourinary: Negative for dysuria, flank pain, frequency, hematuria, menstrual problem, pelvic pain, urgency, vaginal bleeding and vaginal discharge.  Musculoskeletal: Negative for arthralgias, back pain and myalgias.  Skin: Negative for rash.  Allergic/Immunologic: Negative for environmental allergies and food  allergies.  Neurological: Negative for dizziness, weakness, light-headedness, numbness and headaches.  Hematological: Negative for adenopathy. Does not bruise/bleed easily.  Psychiatric/Behavioral: Negative for dysphoric mood. The patient is not nervous/anxious.     Patient Active Problem List   Diagnosis Date Noted   Atrial fibrillation ([place]) 05/20/2018   Hiatal hernia 05/20/2018   Migraine headache 05/20/2018   STEMI (ST elevation myocardial infarction) ([place]) 04/10/2018   History of ST elevation myocardial infarction (STEMI) 04/10/2018   History of cholecystectomy 01/28/2010    Allergies  Allergen Reactions   Gluten Meal     Other reaction(s): GI Reaction   Methylprednisolone Other (See Comments)    dizzy    Past Surgical History:  Procedure Laterality Date   CHOLECYSTECTOMY     COLONOSCOPY W/ ENDOSCOPIC [place]  10/17/2017    Social History   Tobacco Use   Smoking status: Never Smoker   Smokeless tobacco: Never Used  Substance Use Topics   Alcohol use: No   Drug use: No     Medication list has been reviewed and updated.  Current Meds  Medication Sig   apixaban (ELIQUIS) 5 MG TABS tablet Take 1 tablet by mouth 2 (two) times daily. cardio   atorvastatin (LIPITOR) 80 MG tablet Take 1 tablet by mouth daily. Dr Dorna Bloom cardio   furosemide (LASIX) 20 MG tablet Take by mouth as needed. cardio   lisinopril (ZESTRIL) 5 MG tablet Take 1 tablet by mouth daily. cardio   metoprolol succinate (TOPROL-XL) 25 MG 24 hr tablet Take 25 mg by mouth daily. cardio   nitroGLYCERIN (NITROSTAT) 0.4 MG SL tablet Place under the  tongue. cardio   pantoprazole (PROTONIX) 40 MG tablet Take 1 tablet (40 mg total) by mouth daily.    PHQ 2/9 Scores 06/18/2019 04/24/2019 10/22/2018 05/08/2018  PHQ - 2 Score 1 0 1 1  PHQ- 9 Score 2 0 3 1    BP Readings from Last 3 Encounters:  06/18/19 140/70  04/24/19 100/62  10/22/18 98/62    Physical Exam Vitals and nursing note  reviewed.  Constitutional:      General: She is not in acute distress.    Appearance: She is not diaphoretic.  HENT:     Head: Normocephalic and atraumatic.     Right Ear: Tympanic membrane, ear canal and external ear normal. There is no impacted cerumen.     Left Ear: Tympanic membrane, ear canal and external ear normal. There is no impacted cerumen.     Nose: Nose normal. No congestion or rhinorrhea.     Mouth/Throat:     Mouth: Mucous membranes are moist.  Eyes:     General:        Right eye: No discharge.        Left eye: No discharge.     Conjunctiva/sclera: Conjunctivae normal.     Pupils: Pupils are equal, round, and reactive to light.  Neck:     Thyroid: No thyromegaly.     Vascular: No JVD.  Cardiovascular:     Rate and Rhythm: Normal rate and regular rhythm.     Heart sounds: Normal heart sounds. No murmur. No friction rub. No gallop.   Pulmonary:     Effort: Pulmonary effort is normal. No respiratory distress.     Breath sounds: Normal breath sounds. No stridor. No wheezing, rhonchi or rales.  Chest:     Chest wall: No tenderness.  Abdominal:     General: Bowel sounds are normal.     Palpations: Abdomen is soft. There is no mass.     Tenderness: There is no abdominal tenderness. There is no guarding.  Musculoskeletal:        General: Normal range of motion.     Cervical back: Normal range of motion and neck supple.  Lymphadenopathy:     Cervical: No cervical adenopathy.  Skin:    General: Skin is warm and dry.     Capillary Refill: Capillary refill takes less than 2 seconds.     Findings: No bruising or erythema.  Neurological:     Mental Status: She is alert.     Deep Tendon Reflexes: Reflexes are normal and symmetric.     Wt Readings from Last 3 Encounters:  06/18/19 235 lb (106.6 kg)  04/24/19 234 lb (106.1 kg)  10/22/18 225 lb (102.1 kg)    BP 140/70    Pulse 80    Ht 5\' 8"  (1.727 m)    Wt 235 lb (106.6 kg)    BMI 35.73 kg/m   Assessment and  Plan:                                  Patient comes today with follow-up from recent evaluation at [facility] emergency room for evaluation of discomfort of the chest. 1. Abnormal chest x-ray New problem.  It was noted that the patient had atelectasis at the bases of one of the lungs and it was suggested to the patient that we have this reevaluated but repeat chest x-ray in 10 days patient returns on this her  10th day for evaluation.  Physical examination notes breath sounds bilateral. - DG Chest 2 View; Future  2. Elevated transaminase level It was noted that one of the transaminases was elevated to 45 and we will repeat a hepatic function to determine if there is any hepatic abnormalities. - Hepatic Function Panel (6)

## 2019-06-19 LAB — HEPATIC FUNCTION PANEL (6)
ALT: 29 IU/L (ref 0–32)
AST: 23 IU/L (ref 0–40)
Albumin: 4.3 g/dL (ref 3.8–4.8)
Alkaline Phosphatase: 118 IU/L (ref 48–121)
Bilirubin Total: 0.5 mg/dL (ref 0.0–1.2)
Bilirubin, Direct: 0.15 mg/dL (ref 0.00–0.40)

## 2019-08-21 ENCOUNTER — Ambulatory Visit (INDEPENDENT_AMBULATORY_CARE_PROVIDER_SITE_OTHER): Payer: Medicare Other

## 2019-08-21 DIAGNOSIS — Z780 Asymptomatic menopausal state: Secondary | ICD-10-CM

## 2019-08-21 DIAGNOSIS — Z0000 Encounter for general adult medical examination without abnormal findings: Secondary | ICD-10-CM

## 2019-08-21 DIAGNOSIS — Z1231 Encounter for screening mammogram for malignant neoplasm of breast: Secondary | ICD-10-CM

## 2019-08-21 NOTE — Progress Notes (Signed)
Subjective:   Joanne Clark is a 64 y.o. female who presents for an Initial Medicare Annual Wellness Visit.  Virtual Visit via Telephone Note  I connected with  Gayleen Orem on 08/21/19 at  2:40 PM EDT by telephone and verified that I am speaking with the correct person using two identifiers.  Medicare Annual Wellness visit completed telephonically due to Covid-19 pandemic.   Location: Patient: home Provider: office   I discussed the limitations, risks, security and privacy concerns of performing an evaluation and management service by telephone and the availability of in person appointments. The patient expressed understanding and agreed to proceed.  Unable to perform video visit due to video visit attempted and failed and/or patient does not have video capability.   Some vital signs may be absent or patient reported.   Clemetine Marker, LPN    Review of Systems     Cardiac Risk Factors include: advanced age (>28men, >69 women);dyslipidemia;hypertension     Objective:    There were no vitals filed for this visit. There is no height or weight on file to calculate BMI.  Advanced Directives 08/21/2019 03/21/2018 12/23/2017 11/10/2015  Does Patient Have a Medical Advance Directive? No No No No  Would patient like information on creating a medical advance directive? No - Patient declined No - Patient declined - -    Current Medications (verified) Outpatient Encounter Medications as of 08/21/2019  Medication Sig  . apixaban (ELIQUIS) 5 MG TABS tablet Take 1 tablet by mouth 2 (two) times daily. cardio  . aspirin EC 81 MG tablet Take 81 mg by mouth daily.  Marland Kitchen atorvastatin (LIPITOR) 80 MG tablet Take 1 tablet by mouth daily. Dr Claud Kelp [facility] cardio  . lisinopril (ZESTRIL) 5 MG tablet Take 1 tablet by mouth daily. cardio  . nitroGLYCERIN (NITROSTAT) 0.4 MG SL tablet Place under the tongue. cardio  . pantoprazole (PROTONIX) 40 MG tablet Take 1 tablet (40 mg total) by mouth daily.  .  furosemide (LASIX) 20 MG tablet Take by mouth as needed. cardio  . metoprolol succinate (TOPROL-XL) 25 MG 24 hr tablet Take 25 mg by mouth daily. cardio   No facility-administered encounter medications on file as of 08/21/2019.    Allergies (verified) Gluten meal and Methylprednisolone   History: Past Medical History:  Diagnosis Date  . GERD (gastroesophageal reflux disease)   . Hiatal hernia   . Multiple gastric ulcers   . Myocardial infarction [facility])    Past Surgical History:  Procedure Laterality Date  . CHOLECYSTECTOMY    . COLONOSCOPY W/ ENDOSCOPIC [place]  10/17/2017   Family History  Problem Relation Age of Onset  . Dementia Mother   . Diabetes Mother   . Dementia Father   . Diabetes Father   . Heart disease Father   . Cancer Sister   . Diabetes Sister   . Hyperlipidemia Sister   . Diabetes Brother    Social History   Socioeconomic History  . Marital status: Married    Spouse name: Not on file  . Number of children: Not on file  . Years of education: Not on file  . Highest education level: Not on file  Occupational History  . Not on file  Tobacco Use  . Smoking status: Never Smoker  . Smokeless tobacco: Never Used  Vaping Use  . Vaping Use: Never used  Substance and Sexual Activity  . Alcohol use: No  . Drug use: No  . Sexual activity: Not Currently  Other Topics Concern  .  Not on file  Social History Narrative  . Not on file   Social Determinants of Health   Financial Resource Strain: Low Risk   . Difficulty of Paying Living Expenses: Not hard at all  Food Insecurity: No Food Insecurity  . Worried About Charity fundraiser in the Last Year: Never true  . Ran Out of Food in the Last Year: Never true  Transportation Needs: No Transportation Needs  . Lack of Transportation (Medical): No  . Lack of Transportation (Non-Medical): No  Physical Activity: Inactive  . Days of Exercise per Week: 0 days  . Minutes of Exercise per Session: 0 min  Stress: No  Stress Concern Present  . Feeling of Stress : Not at all  Social Connections: Unknown  . Frequency of Communication with Friends and Family: Not on file  . Frequency of Social Gatherings with Friends and Family: Not on file  . Attends Religious Services: Not on file  . Active Member of Clubs or Organizations: Not on file  . Attends Archivist Meetings: Not on file  . Marital Status: Married    Tobacco Counseling Counseling given: Not Answered   Clinical Intake:  Pre-visit preparation completed: Yes  Pain : No/denies pain     Nutritional Risks: None Diabetes: No  How often do you need to have someone help you when you read instructions, pamphlets, or other written materials from your doctor or pharmacy?: 1 - Never    Interpreter Needed?: No  Information entered by :: Clemetine Marker LPN   Activities of Daily Living In your present state of health, do you have any difficulty performing the following activities: 08/21/2019  Hearing? N  Comment declines hearing aids  Vision? N  Difficulty concentrating or making decisions? N  Walking or climbing stairs? N  Dressing or bathing? N  Doing errands, shopping? N  Preparing Food and eating ? N  Using the Toilet? N  In the past six months, have you accidently leaked urine? N  Do you have problems with loss of bowel control? N  Managing your Medications? N  Managing your Finances? N  Housekeeping or managing your Housekeeping? N  Some recent data might be hidden    Patient Care Team: Juline Patch, MD as PCP - General (Family Medicine)  Indicate any recent Medical Services you may have received from other than [facility] providers in the past year (date may be approximate).     Assessment:   This is a routine wellness examination for [place].  Hearing/Vision screen  Hearing Screening   125Hz  250Hz  500Hz  1000Hz  2000Hz  3000Hz  4000Hz  6000Hz  8000Hz   Right ear:           Left ear:           Comments: Pt denies hearing  difficulty  Vision Screening Comments: Pt new to area; needs to establish care with new provider  Dietary issues and exercise activities discussed: Current Exercise Habits: The patient does not participate in regular exercise at present, Exercise limited by: None identified  Goals    . Increase physical activity     Recommend increasing physical activity to at least 3 days per week      Depression Screen PHQ 2/9 Scores 08/21/2019 06/18/2019 04/24/2019 10/22/2018 05/08/2018  PHQ - 2 Score 0 1 0 1 1  PHQ- 9 Score - 2 0 3 1    Fall Risk Fall Risk  08/21/2019 06/18/2019 04/24/2019 05/08/2018  Falls in the past year? 0 0 0  0  Number falls in past yr: 0 - - -  Injury with Fall? 0 - - -  Risk for fall due to : No Fall Risks - - -  Follow up Falls prevention discussed Falls evaluation completed Falls evaluation completed Falls evaluation completed    Any stairs in or around the home? Yes  If so, are there any without handrails? No Home free of loose throw rugs in walkways, pet beds, electrical cords, etc? Yes  Adequate lighting in your home to reduce risk of falls? Yes   ASSISTIVE DEVICES UTILIZED TO PREVENT FALLS:  Life alert? No  Use of a cane, walker or w/c? No  Grab bars in the bathroom? No  Shower chair or bench in shower? Yes  Elevated toilet seat or a handicapped toilet? No   TIMED UP AND GO:  Was the test performed? No . Telephonic visit.   Cognitive Function:        Immunizations Immunization History  Administered Date(s) Administered  . Fluad Quad(high Dose 65+) 10/22/2018  . PFIZER SARS-COV-2 Vaccination 02/28/2019, 03/23/2019  . Pneumococcal Conjugate-13 04/13/2018  . Tdap 06/23/2010    TDAP status: Up to date   Flu Vaccine status: Up to date   Pneumococcal vaccine status: Declined,  Education has been provided regarding the importance of this vaccine but patient still declined. Advised may receive this vaccine at local pharmacy or Health Dept. Aware to provide a  copy of the vaccination record if obtained from local pharmacy or Health Dept. Verbalized acceptance and understanding.    Covid-19 vaccine status: Completed vaccines  Qualifies for Shingles Vaccine? Yes   Zostavax completed No   Shingrix Completed?: No.    Education has been provided regarding the importance of this vaccine. Patient has been advised to call insurance company to determine out of pocket expense if they have not yet received this vaccine. Advised may also receive vaccine at local pharmacy or Health Dept. Verbalized acceptance and understanding.  Screening Tests Health Maintenance  Topic Date Due  . INFLUENZA VACCINE  08/18/2019  . PNA vac Low Risk Adult (2 of 2 - PPSV23) 09/04/2019 (Originally 04/13/2019)  . MAMMOGRAM  06/17/2020 (Originally 05/24/2003)  . DEXA SCAN  06/17/2020 (Originally 05/24/2018)  . Hepatitis C Screening  06/17/2020 (Originally [birth year])  . TETANUS/TDAP  06/22/2020  . COLONOSCOPY  10/18/2027  . COVID-19 Vaccine  Completed    Health Maintenance  Health Maintenance Due  Topic Date Due  . INFLUENZA VACCINE  08/18/2019    Colorectal cancer screening: Completed 10/17/17. Repeat every 10 years    Mammogram status: Ordered today. Pt provided with contact info and advised to call to schedule appt.    Bone Density status: Ordered today. Pt provided with contact info and advised to call to schedule appt.  Lung Cancer Screening: (Low Dose CT Chest recommended if Age 41-80 years, 30 pack-year currently smoking OR have quit w/in 15years.) does not qualify.    Additional Screening:  Hepatitis C Screening: does qualify; postponed   Vision Screening: Recommended annual ophthalmology exams for early detection of glaucoma and other disorders of the eye. Is the patient up to date with their annual eye exam?  No  Who is the provider or what is the name of the office in which the patient attends annual eye exams? Not established If pt is not established with a  provider, would they like to be referred to a provider to establish care? No .   Dental Screening: Recommended annual dental  exams for proper oral hygiene  Community Resource Referral / Chronic Care Management: CRR required this visit?  No   CCM required this visit?  No      Plan:     I have personally reviewed and noted the following in the patient's chart:   . Medical and social history . Use of alcohol, tobacco or illicit drugs  . Current medications and supplements . Functional ability and status . Nutritional status . Physical activity . Advanced directives . List of other physicians . Hospitalizations, surgeries, and ER visits in previous 12 months . Vitals . Screenings to include cognitive, depression, and falls . Referrals and appointments  In addition, I have reviewed and discussed with patient certain preventive protocols, quality metrics, and best practice recommendations. A written personalized care plan for preventive services as well as general preventive health recommendations were provided to patient.     Clemetine Marker, LPN   07/19/7104   Nurse Notes: pt c/o hiatal hernia pain of 8/10 at times more often at night but was not experiencing pain during visit. Pt states she was seen by gastro who did not seem concerned about it but she is interested in having it repaired due to the sharp pains and constant discomfort. Pt advised to schedule appt to discuss options as this has been an ongoing problem for the last 2 years.

## 2019-08-21 NOTE — Patient Instructions (Addendum)
Joanne Clark , Thank you for taking time to come for your Medicare Wellness Visit. I appreciate your ongoing commitment to your health goals. Please review the following plan we discussed and let me know if I can assist you in the future.   Screening recommendations/referrals: Colonoscopy: done 10/17/17 Mammogram: Please call [phone] to schedule your mammogram and bone density screening.   Recommended yearly ophthalmology/optometry visit for glaucoma screening and checkup. Local eye doctors in your area include [facility] and [facility].  Recommended yearly dental visit for hygiene and checkup  Vaccinations: Influenza vaccine: done 10/22/18 Pneumococcal vaccine: done 04/13/18. Due for PPSV23 Tdap vaccine: done 06/23/10 Shingles vaccine: Shingrix discussed. Please contact your pharmacy for coverage information.  Covid-19:done 02/28/19 & 03/23/19  Advanced directives: Advance directive discussed with you today. Even though you declined this today please call our office should you change your mind and we can give you the proper paperwork for you to fill out.  Conditions/risks identified: Recommend increasing physical activity to at least 3 days per week  Next appointment: Follow up in one year for your annual wellness visit    Preventive Care 65 Years and Older, Female Preventive care refers to lifestyle choices and visits with your health care provider that can promote health and wellness. What does preventive care include?  A yearly physical exam. This is also called an annual well check.  Dental exams once or twice a year.  Routine eye exams. Ask your health care provider how often you should have your eyes checked.  Personal lifestyle choices, including:  Daily care of your teeth and gums.  Regular physical activity.  Eating a healthy diet.  Avoiding tobacco and drug use.  Limiting alcohol use.  Practicing safe sex.  Taking low-dose aspirin every  day.  Taking vitamin and mineral supplements as recommended by your health care provider. What happens during an annual well check? The services and screenings done by your health care provider during your annual well check will depend on your age, overall health, lifestyle risk factors, and family history of disease. Counseling  Your health care provider may ask you questions about your:  Alcohol use.  Tobacco use.  Drug use.  Emotional well-being.  Home and relationship well-being.  Sexual activity.  Eating habits.  History of falls.  Memory and ability to understand (cognition).  Work and work Statistician.  Reproductive health. Screening  You may have the following tests or measurements:  Height, weight, and BMI.  Blood pressure.  Lipid and cholesterol levels. These may be checked every 5 years, or more frequently if you are over 65 years old.  Skin check.  Lung cancer screening. You may have this screening every year starting at age 45 if you have a 30-pack-year history of smoking and currently smoke or have quit within the past 15 years.  Fecal occult blood test (FOBT) of the stool. You may have this test every year starting at age 56.  Flexible sigmoidoscopy or colonoscopy. You may have a sigmoidoscopy every 5 years or a colonoscopy every 10 years starting at age 22.  Hepatitis C blood test.  Hepatitis B blood test.  Sexually transmitted disease (STD) testing.  Diabetes screening. This is done by checking your blood sugar (glucose) after you have not eaten for a while (fasting). You may have this done every 1-3 years.  Bone density scan. This is done to screen for osteoporosis. You may have this done starting at age 82.  Mammogram. This  may be done every 1-2 years. Talk to your health care provider about how often you should have regular mammograms. Talk with your health care provider about your test results, treatment options, and if necessary, the need  for more tests. Vaccines  Your health care provider may recommend certain vaccines, such as:  Influenza vaccine. This is recommended every year.  Tetanus, diphtheria, and acellular pertussis (Tdap, Td) vaccine. You may need a Td booster every 10 years.  Zoster vaccine. You may need this after age 68.  Pneumococcal 13-valent conjugate (PCV13) vaccine. One dose is recommended after age 73.  Pneumococcal polysaccharide (PPSV23) vaccine. One dose is recommended after age 49. Talk to your health care provider about which screenings and vaccines you need and how often you need them. This information is not intended to replace advice given to you by your health care provider. Make sure you discuss any questions you have with your health care provider. Document Released: 01/30/2015 Document Revised: 09/23/2015 Document Reviewed: 11/04/2014 Elsevier Interactive Patient Education  2017 [place] Prevention in the Home Falls can cause injuries. They can happen to people of all ages. There are many things you can do to make your home safe and to help prevent falls. What can I do on the outside of my home?  Regularly fix the edges of walkways and driveways and fix any cracks.  Remove anything that might make you trip as you walk through a door, such as a raised step or threshold.  Trim any bushes or trees on the path to your home.  Use bright outdoor lighting.  Clear any walking paths of anything that might make someone trip, such as rocks or tools.  Regularly check to see if handrails are loose or broken. Make sure that both sides of any steps have handrails.  Any raised decks and porches should have guardrails on the edges.  Have any leaves, snow, or ice cleared regularly.  Use sand or salt on walking paths during winter.  Clean up any spills in your garage right away. This includes oil or grease spills. What can I do in the bathroom?  Use night lights.  Install grab bars  by the toilet and in the tub and shower. Do not use towel bars as grab bars.  Use non-skid mats or decals in the tub or shower.  If you need to sit down in the shower, use a plastic, non-slip stool.  Keep the floor dry. Clean up any water that spills on the floor as soon as it happens.  Remove soap buildup in the tub or shower regularly.  Attach bath mats securely with double-sided non-slip rug tape.  Do not have throw rugs and other things on the floor that can make you trip. What can I do in the bedroom?  Use night lights.  Make sure that you have a light by your bed that is easy to reach.  Do not use any sheets or blankets that are too big for your bed. They should not hang down onto the floor.  Have a firm chair that has side arms. You can use this for support while you get dressed.  Do not have throw rugs and other things on the floor that can make you trip. What can I do in the kitchen?  Clean up any spills right away.  Avoid walking on wet floors.  Keep items that you use a lot in easy-to-reach places.  If you need to reach something above  you, use a strong step stool that has a grab bar.  Keep electrical cords out of the way.  Do not use floor polish or wax that makes floors slippery. If you must use wax, use non-skid floor wax.  Do not have throw rugs and other things on the floor that can make you trip. What can I do with my stairs?  Do not leave any items on the stairs.  Make sure that there are handrails on both sides of the stairs and use them. Fix handrails that are broken or loose. Make sure that handrails are as long as the stairways.  Check any carpeting to make sure that it is firmly attached to the stairs. Fix any carpet that is loose or worn.  Avoid having throw rugs at the top or bottom of the stairs. If you do have throw rugs, attach them to the floor with carpet tape.  Make sure that you have a light switch at the top of the stairs and the  bottom of the stairs. If you do not have them, ask someone to add them for you. What else can I do to help prevent falls?  Wear shoes that:  Do not have high heels.  Have rubber bottoms.  Are comfortable and fit you well.  Are closed at the toe. Do not wear sandals.  If you use a stepladder:  Make sure that it is fully opened. Do not climb a closed stepladder.  Make sure that both sides of the stepladder are locked into place.  Ask someone to hold it for you, if possible.  Clearly mark and make sure that you can see:  Any grab bars or handrails.  First and last steps.  Where the edge of each step is.  Use tools that help you move around (mobility aids) if they are needed. These include:  Canes.  Walkers.  Scooters.  Crutches.  Turn on the lights when you go into a dark area. Replace any light bulbs as soon as they burn out.  Set up your furniture so you have a clear path. Avoid moving your furniture around.  If any of your floors are uneven, fix them.  If there are any pets around you, be aware of where they are.  Review your medicines with your doctor. Some medicines can make you feel dizzy. This can increase your chance of falling. Ask your doctor what other things that you can do to help prevent falls. This information is not intended to replace advice given to you by your health care provider. Make sure you discuss any questions you have with your health care provider. Document Released: 10/30/2008 Document Revised: 06/11/2015 Document Reviewed: 02/07/2014 Elsevier Interactive Patient Education  2017 [employer].

## 2019-12-05 ENCOUNTER — Telehealth: Payer: Self-pay | Admitting: Family Medicine

## 2019-12-05 NOTE — Telephone Encounter (Signed)
noted 

## 2019-12-05 NOTE — Telephone Encounter (Signed)
Pt called in wanted to do a Transition of Care and wanted to be with Dr. Einar Pheasant.  I have her scheduled for 01/16/20 at 3:20 pm

## 2020-01-16 ENCOUNTER — Ambulatory Visit (INDEPENDENT_AMBULATORY_CARE_PROVIDER_SITE_OTHER): Payer: Medicare Other | Admitting: Family Medicine

## 2020-01-16 ENCOUNTER — Encounter: Payer: Self-pay | Admitting: Family Medicine

## 2020-01-16 ENCOUNTER — Other Ambulatory Visit: Payer: Self-pay

## 2020-01-16 VITALS — BP 104/64 | HR 84 | Temp 98.2°F | Ht 68.0 in | Wt 235.0 lb

## 2020-01-16 DIAGNOSIS — I4891 Unspecified atrial fibrillation: Secondary | ICD-10-CM | POA: Diagnosis not present

## 2020-01-16 DIAGNOSIS — F4329 Adjustment disorder with other symptoms: Secondary | ICD-10-CM

## 2020-01-16 DIAGNOSIS — K449 Diaphragmatic hernia without obstruction or gangrene: Secondary | ICD-10-CM

## 2020-01-16 DIAGNOSIS — I252 Old myocardial infarction: Secondary | ICD-10-CM

## 2020-01-16 NOTE — Assessment & Plan Note (Addendum)
Still causing significant symptoms even on high dose PPI and avoidance of triggers. She is interested in surgical consult. Will refer. Cont pantoprazole 40 mg

## 2020-01-16 NOTE — Progress Notes (Signed)
Subjective:     Joanne Clark is a 64 y.o. female presenting for New Patient (Initial Visit)     HPI  #high stress/depression - since heart attack - also loss of several loved ones - did not have plaques - gained weight, was used to being healthy - was on medication years ago but had headaches with this - tried 3 different medications and they all seemed to give headaches - thinks wellbutrin    #Hiatal hernia - at night will get severe pain - on pantoprazole 40 mg  - still having significant pain  - will also get pain with heavy lifting   Review of Systems   Social History   Tobacco Use  Smoking Status Never Smoker  Smokeless Tobacco Never Used        Objective:    BP Readings from Last 3 Encounters:  01/16/20 104/64  06/18/19 140/70  04/24/19 100/62   Wt Readings from Last 3 Encounters:  01/16/20 235 lb (106.6 kg)  06/18/19 235 lb (106.6 kg)  04/24/19 234 lb (106.1 kg)    BP 104/64 (BP Location: Right Arm, Patient Position: Sitting, Cuff Size: Large)   Pulse 84   Temp 98.2 F (36.8 C) (Temporal)   Ht 5\' 8"  (1.727 m)   Wt 235 lb (106.6 kg)   SpO2 97%   BMI 35.73 kg/m    Physical Exam Constitutional:      General: She is not in acute distress.    Appearance: She is well-developed. She is not diaphoretic.  HENT:     Right Ear: External ear normal.     Left Ear: External ear normal.  Eyes:     Conjunctiva/sclera: Conjunctivae normal.  Cardiovascular:     Rate and Rhythm: Normal rate and regular rhythm.     Heart sounds: No murmur heard.   Pulmonary:     Effort: Pulmonary effort is normal. No respiratory distress.     Breath sounds: Normal breath sounds. No wheezing.  Musculoskeletal:     Cervical back: Neck supple.  Skin:    General: Skin is warm and dry.     Capillary Refill: Capillary refill takes less than 2 seconds.  Neurological:     Mental Status: She is alert. Mental status is at baseline.  Psychiatric:        Mood and  Affect: Mood normal.        Behavior: Behavior normal.    Depression screen [facility] 2/9 01/16/2020 08/21/2019 06/18/2019  Decreased Interest 0 0 0  Down, Depressed, Hopeless 0 0 1  PHQ - 2 Score 0 0 1  Altered sleeping - - 0  Tired, decreased energy - - 1  Change in appetite - - 0  Feeling bad or failure about yourself  - - 0  Trouble concentrating - - 0  Moving slowly or fidgety/restless - - 0  Suicidal thoughts - - 0  PHQ-9 Score - - 2  Difficult doing work/chores - - Somewhat difficult          Assessment & Plan:   Problem List Items Addressed This Visit      Cardiovascular and Mediastinum   Atrial fibrillation (HCC)    Doing well. Cont eliquis 5 mg bid        Other   Hiatal hernia - Primary    Still causing significant symptoms even on high dose PPI and avoidance of triggers. She is interested in surgical consult. Will refer. Cont pantoprazole 40 mg  Relevant Orders   Ambulatory referral to General Surgery   History of ST elevation myocardial infarction (STEMI)    Taking lisinopril 5 mg, metoprolol 12.5 mg daily. Follows with Dr. Adam Phenix at [facility]. Cont atorvastatin 80 mg and asa 81 mg       Stress and adjustment reaction    Pt notes some stress and depression. Hx of taking medication in the past which worsened migraines. Discussed trial of therapy first and if not improving could reconsider medication. Will also focus on treating her chronic illness and decreasing symptoms as poor health has been a significant impact on overall mood.           Return in about 3 months (around 04/15/2020).  Lynnda Child, MD  This visit occurred during the SARS-CoV-2 public health emergency.  Safety protocols were in place, including screening questions prior to the visit, additional usage of staff PPE, and extensive cleaning of exam room while observing appropriate contact time as indicated for disinfecting solutions.

## 2020-01-16 NOTE — Assessment & Plan Note (Signed)
Doing well. Cont eliquis 5 mg bid

## 2020-01-16 NOTE — Patient Instructions (Signed)
How to help anxiety and depression  1) Regular Exercise - walking, jogging, cycling, dancing, strength training - aiming for 150 minutes of exercise a week --> Yoga has been shown in research to reduce depression and anxiety -- with even just one hour long session per week  2)  Begin a Mindfulness/Meditation practice -- this can take a little as 3 minutes and is helpful for all kinds of mood issues -- You can find resources in books -- Or you can download apps like  ---- Headspace App  ---- Calm  ---- Insignt Timer ---- Stop, Breathe & Think  # With each of these Apps - you should decline the "start free trial" offer and as you search through the App should be able to access some of their free content. You can also chose to pay for the content if you find one that works well for you.   # Many of them also offer sleep specific content which may help with insomnia  3) Healthy Diet -- Avoid or decrease Caffeine -- Avoid or decrease Alcohol -- Drink plenty of water, have a balanced diet -- Avoid cigarettes and marijuana (as well as other recreational drugs)  4) Find a therapist  -- [facility] is one option. Call [phone] -- Or you can check out www.psychologytoday.com -- you can read bios of therapists and see if they accept insurance -- Check with your insurance to see if you have coverage and who may take your insurance

## 2020-01-16 NOTE — Assessment & Plan Note (Signed)
Taking lisinopril 5 mg, metoprolol 12.5 mg daily. Follows with Dr. Adam Phenix at [facility]. Cont atorvastatin 80 mg and asa 81 mg

## 2020-01-16 NOTE — Assessment & Plan Note (Signed)
Pt notes some stress and depression. Hx of taking medication in the past which worsened migraines. Discussed trial of therapy first and if not improving could reconsider medication. Will also focus on treating her chronic illness and decreasing symptoms as poor health has been a significant impact on overall mood.

## 2020-02-03 ENCOUNTER — Ambulatory Visit: Payer: Medicare Other | Admitting: Surgery

## 2020-02-05 ENCOUNTER — Other Ambulatory Visit: Payer: Self-pay

## 2020-02-05 ENCOUNTER — Ambulatory Visit (INDEPENDENT_AMBULATORY_CARE_PROVIDER_SITE_OTHER): Payer: Medicare Other | Admitting: Surgery

## 2020-02-05 ENCOUNTER — Encounter: Payer: Self-pay | Admitting: Surgery

## 2020-02-05 VITALS — BP 129/85 | HR 75 | Temp 97.9°F | Ht 68.0 in | Wt 232.8 lb

## 2020-02-05 DIAGNOSIS — R1084 Generalized abdominal pain: Secondary | ICD-10-CM

## 2020-02-05 DIAGNOSIS — K449 Diaphragmatic hernia without obstruction or gangrene: Secondary | ICD-10-CM | POA: Diagnosis not present

## 2020-02-05 NOTE — Patient Instructions (Addendum)
We will get you set up for a Barium Swallow and a CT of the abdomen and pelvis with contrast.   We will also refer you to Gastroenterology for an upper endoscopy exam. They will call you to schedule this.   We will see you back after you have all of these exams to discuss the next steps.    We have you scheduled for a CT abdomen pelvis with contrast and a Barium swallow at [facility] on 02/17/20 at 10:00 am . You will need to arrive at the [place] entrance by 9:30 am. You will need to have nothing to eat or drink for 4 hours prior. You will need to pick up a prep kit prior to this exam. You will also need to have labs done prior to your exam. These can be done anytime at [facility] entrance.

## 2020-02-07 NOTE — Progress Notes (Signed)
Patient ID: Joanne Clark, female   DOB: 1955-06-05, 65 y.o.   MRN: [id number]  HPI Joanne Clark is a 65 y.o. female seen in consultation at the request of Dr.Aleskerov for a paraesophageal hernia.  She does report significant reflux worsening when she lays down.  He had a CT scan at outside facility showing evidence of a hiatal hernia.  Does have significant coronary artery disease and had to a stent in place.  She takes Eliquis and ASA.  Marland Kitchen Past surgical history included a cholecystectomy in the past. Did have a CT scan more than 2 years ago showing small sliding hiatal hernia.  No other major acute intra-abdominal abnormalities. Reviewed her labs showing normal CBC and CMP.  She is able to perform more than 4 METS of activity without any shortness of breath or chest pain. She does have some chronic cough.  No evidence of dysphagia.  She does have significant reflux symptoms despite high-dose PPI.   HPI  Past Medical History:  Diagnosis Date   Depression    GERD (gastroesophageal reflux disease)    Hiatal hernia    Multiple gastric ulcers    Myocardial infarction [facility])    STEMI (ST elevation myocardial infarction) ([place]) 04/10/2018    Past Surgical History:  Procedure Laterality Date   CHOLECYSTECTOMY  2010   COLONOSCOPY W/ ENDOSCOPIC [place]  10/17/2017    Family History  Problem Relation Age of Onset   Dementia Mother    Diabetes Mother    Dementia Father    Diabetes Father    Heart disease Father    Cancer Sister        unknown type   Diabetes Sister    Hyperlipidemia Sister    Diabetes Brother     Social History Social History   Tobacco Use   Smoking status: Never Smoker   Smokeless tobacco: Never Used  Scientific laboratory technician Use: Never used  Substance Use Topics   Alcohol use: No   Drug use: No    Allergies  Allergen Reactions   Gluten Meal     Other reaction(s): GI Reaction   Methylprednisolone Other (See Comments)    dizzy     Current Outpatient Medications  Medication Sig Dispense Refill   apixaban (ELIQUIS) 5 MG TABS tablet Take 1 tablet by mouth 2 (two) times daily. cardio     aspirin EC 81 MG tablet Take 81 mg by mouth daily.     atorvastatin (LIPITOR) 80 MG tablet Take 1 tablet by mouth daily. Dr Dorna Bloom cardio     lisinopril (ZESTRIL) 5 MG tablet Take 1 tablet by mouth daily. cardio     nitroGLYCERIN (NITROSTAT) 0.4 MG SL tablet Place under the tongue. cardio     pantoprazole (PROTONIX) 40 MG tablet Take 1 tablet (40 mg total) by mouth daily. 90 tablet 3   furosemide (LASIX) 20 MG tablet Take by mouth as needed. cardio     metoprolol succinate (TOPROL-XL) 25 MG 24 hr tablet Take 12.5 mg by mouth daily. cardio     No current facility-administered medications for this visit.     Review of Systems Full ROS  was asked and was negative except for the information on the HPI  Physical Exam Blood pressure 129/85, pulse 75, temperature 97.9 F (36.6 C), height 5\' 8"  (1.727 m), weight 232 lb 12.8 oz (105.6 kg), SpO2 98 %. CONSTITUTIONAL: NAD EYES: Pupils are equal, round, , Sclera are non-icteric. EARS, NOSE, MOUTH AND THROAT: She  is wearing a mask. Hearing is intact to voice. LYMPH NODES:  Lymph nodes in the neck are normal. RESPIRATORY:  Lungs are clear. There is normal respiratory effort, with equal breath sounds bilaterally, and without pathologic use of accessory muscles. CARDIOVASCULAR: Heart is regular without murmurs, gallops, or rubs. GI: The abdomen is soft, nontender, and nondistended. There are no palpable masses. There is no hepatosplenomegaly. There are normal bowel sounds in all quadrants. GU: Rectal deferred.   MUSCULOSKELETAL: Normal muscle strength and tone. No cyanosis or edema.   SKIN: Turgor is good and there are no pathologic skin lesions or ulcers. NEUROLOGIC: Motor and sensation is grossly normal. Cranial nerves are grossly intact. PSYCH:  Oriented to person, place and  time. Affect is normal.  Data Reviewed  I have personally reviewed the patient's imaging, laboratory findings and medical records.    Assessment/Plan 65 year old female with a hiatal hernia that is symptomatic.  Discussed with her in detail about her disease process.  I do think that she will merit further work-up.  We will order a barium swallow as well as a CT scan of the abdomen and pelvis.  She also needs endoscopic evaluation to assess the esophagus and the stomach endoscopically. Have also discussed about surgical procedure for repair of the hiatal hernia.  I discussed with her in detail about the operation.  Risks, benefits and possible complications.  Before we move forward with any kind of intervention we will have to establish a good diagnosis and delineate the anatomy very well. Time spent with the patient was 60 minutes, with more than 50% of the time spent in face-to-face education, counseling and care coordination.     Caroleen Hamman, MD FACS General Surgeon 02/07/2020, 3:41 PM

## 2020-02-12 ENCOUNTER — Other Ambulatory Visit
Admission: RE | Admit: 2020-02-12 | Discharge: 2020-02-12 | Disposition: A | Discharge status: Home / Self Care | Payer: Medicare Other | Source: Ambulatory Visit | Attending: Surgery | Admitting: Surgery

## 2020-02-12 DIAGNOSIS — K449 Diaphragmatic hernia without obstruction or gangrene: Secondary | ICD-10-CM | POA: Diagnosis not present

## 2020-02-12 DIAGNOSIS — R1084 Generalized abdominal pain: Secondary | ICD-10-CM

## 2020-02-12 LAB — CREATININE, SERUM
Creatinine, Ser: 0.77 mg/dL (ref 0.44–1.00)
GFR, Estimated: >60 mL/min (ref >60)

## 2020-02-12 LAB — BUN: BUN: 19 mg/dL (ref 8–23)

## 2020-02-17 ENCOUNTER — Ambulatory Visit
Admission: RE | Admit: 2020-02-17 | Discharge: 2020-02-17 | Disposition: A | Discharge status: Home / Self Care | Payer: Medicare Other | Source: Ambulatory Visit | Attending: Surgery | Admitting: Surgery

## 2020-02-17 ENCOUNTER — Other Ambulatory Visit: Payer: Self-pay

## 2020-02-17 ENCOUNTER — Telehealth: Payer: Self-pay | Admitting: *Deleted

## 2020-02-17 DIAGNOSIS — R109 Unspecified abdominal pain: Secondary | ICD-10-CM | POA: Diagnosis not present

## 2020-02-17 DIAGNOSIS — R1084 Generalized abdominal pain: Secondary | ICD-10-CM

## 2020-02-17 DIAGNOSIS — K449 Diaphragmatic hernia without obstruction or gangrene: Secondary | ICD-10-CM

## 2020-02-17 MED ORDER — IOHEXOL 300 MG/ML  SOLN
100.0000 mL | Freq: Once | INTRAMUSCULAR | Status: AC | PRN
  Administered 2020-02-17: 100 mL via INTRAVENOUS

## 2020-02-17 NOTE — Telephone Encounter (Signed)
Spoke with pt and r/s her appt with Dr. Dahlia Byes due to not getting an appt with GI until March. Pt verbalized understanding.

## 2020-02-17 NOTE — Telephone Encounter (Signed)
We referred patient to Dr Lance Morin office for hiatal hernia Dr Dahlia Byes, patient is scheduled to see Dr Marius Ditch on 03/31/20. Patient wants to know if that is okay or does she need a sooner appointment?

## 2020-02-18 ENCOUNTER — Telehealth: Payer: Self-pay

## 2020-02-18 NOTE — Telephone Encounter (Signed)
Patient notified of Barium swallow results and keep follow up appointment with Dr.Pabon . Patient scheduled to see GI for EGD mid March.

## 2020-03-04 ENCOUNTER — Ambulatory Visit: Payer: Medicare Other | Admitting: Surgery

## 2020-03-31 ENCOUNTER — Other Ambulatory Visit: Payer: Self-pay

## 2020-03-31 ENCOUNTER — Encounter: Payer: Self-pay | Admitting: Gastroenterology

## 2020-03-31 ENCOUNTER — Ambulatory Visit: Payer: Medicare Other | Admitting: Gastroenterology

## 2020-03-31 VITALS — BP 120/73 | HR 73 | Ht 68.0 in | Wt 234.4 lb

## 2020-03-31 DIAGNOSIS — R7989 Other specified abnormal findings of blood chemistry: Secondary | ICD-10-CM | POA: Diagnosis not present

## 2020-03-31 DIAGNOSIS — R1010 Upper abdominal pain, unspecified: Secondary | ICD-10-CM | POA: Diagnosis not present

## 2020-03-31 MED ORDER — OMEPRAZOLE 40 MG PO CPDR: 40.0000 mg | DELAYED_RELEASE_CAPSULE | Freq: Two times a day (BID) | ORAL | 2 refills | Status: DC

## 2020-03-31 NOTE — Progress Notes (Signed)
[name], MD [address]  [place]  [place], [place] [address]  Main: [phone]  Fax: [phone]    Gastroenterology Consultation  Referring Provider:     Jules Husbands, MD Primary Care Physician:  Lesleigh Noe, MD Primary Gastroenterologist:  Dr. Kathaleen Grinder Reason for Consultation:     Chronic upper abdominal pain        HPI:   Joanne Clark is a 65 y.o. female referred from ER for consultation & management of chronic left lower back pain.  [place].  She reports 8 months history of progressively worsening left lower back pain.  The pain initially started as discomfort below the left rib cage then moving to left lower back.  This started 8 months ago in [place], and she acknowledges that she did work her up lifting heavy weights. She was seen by GI in [place], Dr. Kathaleen Grinder initially as she was concerned if she had a hernia that popped causing the pain.  She underwent CT abdomen and pelvis followed by EGD, colonoscopy and gastric emptying study which were all unremarkable.  She was found to have 5 cm hiatal hernia per the EGD report.  However CT abdomen pelvis reported small hiatal hernia.  Patient reports intermittent heartburn for which she is taking Protonix daily.  She denies any other GI symptoms.  She denies weight loss, loss of appetite  Over the course of several months, she has noticed worsening of pain particularly when getting into the car and after prolonged walking.  She had atleast two exacerbations requiring ER visits.  Most recently, she went to [facility] ER, underwent CT renal study protocol which was unremarkable.  Her labs were unremarkable patient underwent cholecystectomy 2 years ago for chronic right upper quadrant pain and she was told that she has severe biliary dyskinesia based on the HIDA scan.  During majority of encounter with her, patient was pointing to left lateral lower  back with her finger  Follow-up visit 03/31/2020 Patient is referred by Dr. Dahlia Byes to evaluate for upper endoscopy.  She has been experiencing chronic upper abdominal discomfort, across her upper abdomen and tenderness in the epigastric area.  This has been ongoing for at least 2 years.  Her worst pain is when she lays flat and she had episodes that disturbed her sleep due to severe pain.  She denies heartburn or regurgitation.  She is taking Protonix 40 mg once a day.  She underwent CT abdomen and pelvis which revealed hiatal hernia, she had history of cholecystectomy.  She also underwent esophagogram which also revealed transient small hiatal hernia only.  Patient feels miserable and she was upset due to degree of pain that she is going through.  She has stressful last 2 or 3 years in her life due to loss of multiple family members.  She has a very supportive husband.  She is trying to modify her diet to lose weight, tries to stay active.  She does not drink alcohol.  Patient underwent upper endoscopy in 2019 at outside facility, reported to have 5 cm hiatal hernia  NSAIDs: None  Antiplts/Anticoagulants/Anti thrombotics: None  GI Procedures:   NM gastric emptying study 10/27/2017 On the first image, there is adequate radiotracer uptake within the stomach. There is no radiotracer within the distal esophagus to suggest gastroesophageal reflux.  On the 60 minute image, there is 78% radiotracer remaining within the stomach (upper limit  of normal - 90%*).  On the 120 minute image, there is 34% radiotracer remaining within the stomach (upper limit of normal - 60%*).  On the 180 minute image, there is 6% radiotracer remaining within the stomach (upper limit of normal - 30%*).  On the 240 minute image, there is 5% radiotracer remaining within the stomach (upper limit of normal - 10%*).  **Normal limits of gastric retention data is from Am J Gastroenterology. 2007;102:1-11.   T1/2 time of 98  minutes is indicative of mildly delayed gastric emptying (upper    EGD 10/02/2017 Impression:     - Normal esophagus. Biopsied.            - 5 cm hiatal hernia.            - Normal stomach. Biopsied.            - Normal examined duodenum. Biopsied. 1.  Duodenum, biopsy:      -Unremarkable duodenal tissue.      -No celiac disease, dysplasia or malignancy.  2.  Stomach, biopsy:      -Unremarkable fundic type gastric mucosa.      -No Helicobacter organisms on H/E stain.      -No dysplasia or malignancy.  3.  Esophagus, biopsy:      -Unremarkable squamous mucosa.      -No gastric mucosa.      -No eosinophilic esophagitis, dysplasia or malignancy.  Colonoscopy 10/30/2017 Findings:    The perianal and digital rectal examinations were normal.    The entire examined colon appeared normal on direct and retroflexion     views.   Past Medical History:  Diagnosis Date  . Depression   . GERD (gastroesophageal reflux disease)   . Hiatal hernia   . Multiple gastric ulcers   . Myocardial infarction ([place])   . STEMI (ST elevation myocardial infarction) ([place]) 04/10/2018    Past Surgical History:  Procedure Laterality Date  . CHOLECYSTECTOMY  2010  . COLONOSCOPY W/ ENDOSCOPIC [place]  10/17/2017    Current Outpatient Medications:  .  apixaban (ELIQUIS) 5 MG TABS tablet, Take 1 tablet by mouth 2 (two) times daily. cardio, Disp: , Rfl:  .  aspirin EC 81 MG tablet, Take 81 mg by mouth daily., Disp: , Rfl:  .  atorvastatin (LIPITOR) 80 MG tablet, Take 1 tablet by mouth daily. Dr Dorna Bloom cardio, Disp: , Rfl:  .  lisinopril (ZESTRIL) 5 MG tablet, Take 1 tablet by mouth daily. cardio, Disp: , Rfl:  .  nitroGLYCERIN (NITROSTAT) 0.4 MG SL tablet, Place under the tongue. cardio, Disp: , Rfl:  .  omeprazole (PRILOSEC) 40 MG capsule, Take 1 capsule (40 mg total) by mouth 2 (two) times daily before a meal., Disp: 60 capsule, Rfl: 2 .  furosemide (LASIX) 20 MG  tablet, Take by mouth as needed. cardio, Disp: , Rfl:  .  metoprolol succinate (TOPROL-XL) 25 MG 24 hr tablet, Take 12.5 mg by mouth daily. cardio, Disp: , Rfl:     Family History  Problem Relation Age of Onset  . Dementia Mother   . Diabetes Mother   . Dementia Father   . Diabetes Father   . Heart disease Father   . Cancer Sister        unknown type  . Diabetes Sister   . Hyperlipidemia Sister   . Diabetes Brother      Social History   Tobacco Use  . Smoking status: Never Smoker  . Smokeless tobacco: Never Used  Vaping Use  . Vaping Use: Never used  Substance Use Topics  . Alcohol use: No  . Drug use: No    Allergies as of 03/31/2020 - Review Complete 03/31/2020  Allergen Reaction Noted  . Gluten meal  08/17/2006  . Methylprednisolone Other (See Comments) 12/23/2017    Review of Systems:    All systems reviewed and negative except where noted in HPI.   Physical Exam:  BP 120/73 (BP Location: Left Arm, Patient Position: Sitting, Cuff Size: Large)   Pulse 73   Ht 5\' 8"  (1.727 m)   Wt 234 lb 6.4 oz (106.3 kg)   BMI 35.64 kg/m  No LMP recorded. Patient is postmenopausal.  General:   Alert,  Well-developed, well-nourished, pleasant and cooperative in NAD Head:  Normocephalic and atraumatic. Eyes:  Sclera clear, no icterus.   Conjunctiva pink. Ears:  Normal auditory acuity. Nose:  No deformity, discharge, or lesions. Mouth:  No deformity or lesions,oropharynx pink & moist. Neck:  Supple; no masses or thyromegaly. Lungs:  Respirations even and unlabored.  Clear throughout to auscultation.   No wheezes, crackles, or rhonchi. No acute distress. Heart:  Regular rate and rhythm; no murmurs, clicks, rubs, or gallops. Abdomen:  Normal bowel sounds. Soft, epigastric tenderness and non-distended without masses, hepatosplenomegaly or hernias noted.  No guarding or rebound tenderness.   Rectal: Not performed Msk:  Symmetrical without gross deformities. Good, equal movement  & strength bilaterally. Pulses:  Normal pulses noted. Extremities:  No clubbing or edema.  No cyanosis. Neurologic:  Alert and oriented x3;  grossly normal neurologically. Skin:  Intact without significant lesions or rashes. No jaundice. Psych:  Alert and cooperative. Normal mood and affect.  Imaging Studies: Reviewed  Assessment and Plan:   Joanne Clark is a 65 y.o. Caucasian female with history of A. fib on Eliquis, obesity is seen in consultation for chronic upper abdominal pain, predominantly in the epigastric region.  CT abdomen pelvis revealed normal pancreas, no acute intra-abdominal pathology.  S/p cholecystectomy  Recommend upper endoscopy for further evaluation, rule out peptic ulcer disease Switch from Protonix to Prilosec 40 mg twice daily before meals Discussed about antireflux lifestyle Depending on the size of the hernia, patient can follow-up with Dr. Dahlia Byes for hernia repair  Elevated LFTs in 5/21 Recheck LFTs during next visit  Follow up in 3 months   [name], MD

## 2020-03-31 NOTE — Patient Instructions (Signed)
Food Choices for Gastroesophageal Reflux Disease, Adult When you have gastroesophageal reflux disease (GERD), the foods you eat and your eating habits are very important. Choosing the right foods can help ease your discomfort. Think about working with a food expert (dietitian) to help you make good choices. What are tips for following this plan? Reading food labels  Look for foods that are low in saturated fat. Foods that may help with your symptoms include: ? Foods that have less than 5% of daily value (DV) of fat. ? Foods that have 0 grams of trans fat. Cooking  Do not fry your food.  Cook your food by baking, steaming, grilling, or broiling. These are all methods that do not need a lot of fat for cooking.  To add flavor, try to use herbs that are low in spice and acidity. Meal planning  Choose healthy foods that are low in fat, such as: ? Fruits and vegetables. ? Whole grains. ? Low-fat dairy products. ? Lean meats, fish, and poultry.  Eat small meals often instead of eating 3 large meals each day. Eat your meals slowly in a place where you are relaxed. Avoid bending over or lying down until 2-3 hours after eating.  Limit high-fat foods such as fatty meats or fried foods.  Limit your intake of fatty foods, such as oils, butter, and shortening.  Avoid the following as told by your doctor: ? Foods that cause symptoms. These may be different for different people. Keep a food diary to keep track of foods that cause symptoms. ? Alcohol. ? Drinking a lot of liquid with meals. ? Eating meals during the 2-3 hours before bed.   Lifestyle  Stay at a healthy weight. Ask your doctor what weight is healthy for you. If you need to lose weight, work with your doctor to do so safely.  Exercise for at least 30 minutes on 5 or more days each week, or as told by your doctor.  Wear loose-fitting clothes.  Do not smoke or use any products that contain nicotine or tobacco. If you need help  quitting, ask your doctor.  Sleep with the head of your bed higher than your feet. Use a wedge under the mattress or blocks under the bed frame to raise the head of the bed.  Chew sugar-free gum after meals. What foods should eat? Eat a healthy, well-balanced diet of fruits, vegetables, whole grains, low-fat dairy products, lean meats, fish, and poultry. Each person is different. Foods that may cause symptoms in one person may not cause any symptoms in another person. Work with your doctor to find foods that are safe for you. The items listed above may not be a complete list of what you can eat and drink. Contact a food expert for more options.   What foods should I avoid? Limiting some of these foods may help in managing the symptoms of GERD. Everyone is different. Talk with a food expert or your doctor to help you find the exact foods to avoid, if any. Fruits Any fruits prepared with added fat. Any fruits that cause symptoms. For some people, this may include citrus fruits, such as oranges, grapefruit, pineapple, and lemons. Vegetables Deep-fried vegetables. French fries. Any vegetables prepared with added fat. Any vegetables that cause symptoms. For some people, this may include tomatoes and tomato products, chili peppers, onions and garlic, and horseradish. Grains Pastries or quick breads with added fat. Meats and other proteins High-fat meats, such as fatty beef or pork,   hot dogs, ribs, ham, sausage, salami, and bacon. Fried meat or protein, including fried fish and fried chicken. Nuts and nut butters, in large amounts. Dairy Whole milk and chocolate milk. Sour cream. Cream. Ice cream. Cream cheese. Milkshakes. Fats and oils Butter. Margarine. Shortening. Ghee. Beverages Coffee and tea, with or without caffeine. Carbonated beverages. Sodas. Energy drinks. Fruit juice made with acidic fruits, such as orange or grapefruit. Tomato juice. Alcoholic drinks. Sweets and desserts Chocolate and  cocoa. Donuts. Seasonings and condiments Pepper. Peppermint and spearmint. Added salt. Any condiments, herbs, or seasonings that cause symptoms. For some people, this may include curry, hot sauce, or vinegar-based salad dressings. The items listed above may not be a complete list of what you should not eat and drink. Contact a food expert for more options. Questions to ask your doctor Diet and lifestyle changes are often the first steps that are taken to manage symptoms of GERD. If diet and lifestyle changes do not help, talk with your doctor about taking medicines. Where to find more information  [employer]: aboutgerd.org Summary  When you have GERD, food and lifestyle choices are very important in easing your symptoms.  Eat small meals often instead of 3 large meals a day. Eat your meals slowly and in a place where you are relaxed.  Avoid bending over or lying down until 2-3 hours after eating.  Limit high-fat foods such as fatty meats or fried foods. This information is not intended to replace advice given to you by your health care provider. Make sure you discuss any questions you have with your health care provider. Document Revised: 07/15/2019 Document Reviewed: 07/15/2019 Elsevier Patient Education  2021 [employer] Inc.  

## 2020-04-01 ENCOUNTER — Encounter: Payer: Self-pay | Admitting: Gastroenterology

## 2020-04-01 ENCOUNTER — Encounter: Payer: Self-pay | Admitting: Anesthesiology

## 2020-04-02 ENCOUNTER — Other Ambulatory Visit: Payer: Self-pay

## 2020-04-02 ENCOUNTER — Telehealth: Payer: Self-pay

## 2020-04-02 DIAGNOSIS — R1010 Upper abdominal pain, unspecified: Secondary | ICD-10-CM

## 2020-04-02 NOTE — Telephone Encounter (Signed)
Patient called back and states she has appointment with Cardiology on 04/08/2020. We rescheduled procedure to 04/28/2020 with Dr. Marius Ditch. Refax cardiology request for 04/28/2020

## 2020-04-02 NOTE — Telephone Encounter (Addendum)
Called [facility] to see if they have received the blood thinner request. They state patient had images in 05/2019 but has not seen a provider in over a year. She states that she will need a appointment.  Phone number [phone] FAX number [phone]  Called and informed patient that she needed to make a appointment before they could do blood thinner request. Informed her I would cancel her procedure at this time but when she is seen just to call us back and we can put her back on the scheduled she verbalized understanding. Called Kim and cancel the procedure in [place]

## 2020-04-06 ENCOUNTER — Emergency Department: Payer: Medicare Other

## 2020-04-06 ENCOUNTER — Other Ambulatory Visit: Payer: Self-pay

## 2020-04-06 ENCOUNTER — Emergency Department
Admission: EM | Admit: 2020-04-06 | Discharge: 2020-04-07 | Disposition: A | Discharge status: Home / Self Care | Payer: Medicare Other | Attending: Emergency Medicine | Admitting: Emergency Medicine

## 2020-04-06 ENCOUNTER — Telehealth: Payer: Self-pay

## 2020-04-06 DIAGNOSIS — Z7982 Long term (current) use of aspirin: Secondary | ICD-10-CM | POA: Insufficient documentation

## 2020-04-06 DIAGNOSIS — J019 Acute sinusitis, unspecified: Secondary | ICD-10-CM | POA: Diagnosis not present

## 2020-04-06 DIAGNOSIS — I110 Hypertensive heart disease with heart failure: Secondary | ICD-10-CM | POA: Insufficient documentation

## 2020-04-06 DIAGNOSIS — I4891 Unspecified atrial fibrillation: Secondary | ICD-10-CM | POA: Diagnosis not present

## 2020-04-06 DIAGNOSIS — R42 Dizziness and giddiness: Secondary | ICD-10-CM | POA: Diagnosis not present

## 2020-04-06 DIAGNOSIS — I5042 Chronic combined systolic (congestive) and diastolic (congestive) heart failure: Secondary | ICD-10-CM | POA: Insufficient documentation

## 2020-04-06 DIAGNOSIS — M542 Cervicalgia: Secondary | ICD-10-CM | POA: Diagnosis not present

## 2020-04-06 DIAGNOSIS — M25511 Pain in right shoulder: Secondary | ICD-10-CM | POA: Diagnosis not present

## 2020-04-06 DIAGNOSIS — Z79899 Other long term (current) drug therapy: Secondary | ICD-10-CM | POA: Diagnosis not present

## 2020-04-06 DIAGNOSIS — W228XXA Striking against or struck by other objects, initial encounter: Secondary | ICD-10-CM | POA: Diagnosis not present

## 2020-04-06 DIAGNOSIS — I10 Essential (primary) hypertension: Secondary | ICD-10-CM | POA: Diagnosis not present

## 2020-04-06 DIAGNOSIS — R531 Weakness: Secondary | ICD-10-CM | POA: Diagnosis not present

## 2020-04-06 DIAGNOSIS — Z7901 Long term (current) use of anticoagulants: Secondary | ICD-10-CM | POA: Diagnosis not present

## 2020-04-06 DIAGNOSIS — J0101 Acute recurrent maxillary sinusitis: Secondary | ICD-10-CM | POA: Diagnosis not present

## 2020-04-06 DIAGNOSIS — J0100 Acute maxillary sinusitis, unspecified: Secondary | ICD-10-CM

## 2020-04-06 DIAGNOSIS — R519 Headache, unspecified: Secondary | ICD-10-CM | POA: Diagnosis not present

## 2020-04-06 HISTORY — DX: Essential (primary) hypertension: I10

## 2020-04-06 HISTORY — DX: Hyperlipidemia, unspecified: E78.5

## 2020-04-06 LAB — CBC
HCT: 40.7 % (ref 36.0–46.0)
Hemoglobin: 13.4 g/dL (ref 12.0–15.0)
MCH: 29.7 pg (ref 26.0–34.0)
MCHC: 32.9 g/dL (ref 30.0–36.0)
MCV: 90.2 fL (ref 80.0–100.0)
Platelets: 222 10*3/uL (ref 150–400)
RBC: 4.51 MIL/uL (ref 3.87–5.11)
RDW: 13.4 % (ref 11.5–15.5)
WBC: 5.9 10*3/uL (ref 4.0–10.5)
nRBC: 0 % (ref 0.0–0.2)

## 2020-04-06 LAB — URINALYSIS, COMPLETE (UACMP) WITH MICROSCOPIC
Bacteria, UA: NONE SEEN
Bilirubin Urine: NEGATIVE
Glucose, UA: NEGATIVE mg/dL
Hgb urine dipstick: NEGATIVE
Ketones, ur: NEGATIVE mg/dL
Leukocytes,Ua: NEGATIVE
Nitrite: NEGATIVE
Protein, ur: NEGATIVE mg/dL
Specific Gravity, Urine: 1.021 (ref 1.005–1.030)
pH: 7 (ref 5.0–8.0)

## 2020-04-06 LAB — BASIC METABOLIC PANEL
Anion gap: 6 (ref 5–15)
BUN: 17 mg/dL (ref 8–23)
CO2: 26 mmol/L (ref 22–32)
Calcium: 9.2 mg/dL (ref 8.9–10.3)
Chloride: 106 mmol/L (ref 98–111)
Creatinine, Ser: 0.72 mg/dL (ref 0.44–1.00)
GFR, Estimated: >60 mL/min (ref >60)
Glucose, Bld: 120 mg/dL — ABNORMAL HIGH (ref 70–99)
Potassium: 3.6 mmol/L (ref 3.5–5.1)
Sodium: 138 mmol/L (ref 135–145)

## 2020-04-06 MED ORDER — ONDANSETRON HCL 4 MG/2ML IJ SOLN
4.0000 mg | Freq: Once | INTRAMUSCULAR | Status: AC
  Administered 2020-04-06: 4 mg via INTRAVENOUS
  Filled 2020-04-06: qty 2

## 2020-04-06 MED ORDER — AMOXICILLIN-POT CLAVULANATE 875-125 MG PO TABS: 1.0000 | ORAL_TABLET | Freq: Two times a day (BID) | ORAL | 0 refills | Status: AC

## 2020-04-06 MED ORDER — MORPHINE SULFATE (PF) 2 MG/ML IV SOLN
2.0000 mg | Freq: Once | INTRAVENOUS | Status: AC
Start: 2020-04-06 — End: 2020-04-06
  Administered 2020-04-06: 2 mg via INTRAVENOUS
  Filled 2020-04-06: qty 1

## 2020-04-06 MED ORDER — IOHEXOL 350 MG/ML SOLN
75.0000 mL | Freq: Once | INTRAVENOUS | Status: AC | PRN
  Administered 2020-04-06: 75 mL via INTRAVENOUS

## 2020-04-06 MED ORDER — MORPHINE SULFATE (PF) 4 MG/ML IV SOLN
4.0000 mg | Freq: Once | INTRAVENOUS | Status: AC
Start: 2020-04-06 — End: 2020-04-06
  Administered 2020-04-06: 4 mg via INTRAVENOUS
  Filled 2020-04-06: qty 1

## 2020-04-06 MED ORDER — DICLOFENAC SODIUM 1 % EX GEL: 4.0000 g | Freq: Four times a day (QID) | CUTANEOUS | 0 refills | Status: AC

## 2020-04-06 MED ORDER — MECLIZINE HCL 12.5 MG PO TABS: 12.5000 mg | ORAL_TABLET | Freq: Three times a day (TID) | ORAL | 0 refills | Status: DC | PRN

## 2020-04-06 MED ORDER — SODIUM CHLORIDE 0.9 % IV BOLUS
1000.0000 mL | Freq: Once | INTRAVENOUS | Status: AC
  Administered 2020-04-06: 1000 mL via INTRAVENOUS

## 2020-04-06 MED ORDER — ACETAMINOPHEN 500 MG PO TABS
1000.0000 mg | ORAL_TABLET | Freq: Once | ORAL | Status: AC
  Administered 2020-04-06: 1000 mg via ORAL
  Filled 2020-04-06: qty 2

## 2020-04-06 NOTE — ED Notes (Signed)
Dr. Myrene Buddy, MD at bedside for assessment.

## 2020-04-06 NOTE — ED Notes (Signed)
Husband at bedside.  

## 2020-04-06 NOTE — ED Notes (Signed)
Pt transported to CT at this time.

## 2020-04-06 NOTE — Telephone Encounter (Signed)
Noted, will plan to f/u ER visit

## 2020-04-06 NOTE — Telephone Encounter (Signed)
[facility] - Client TELEPHONE ADVICE RECORD [facility] Patient Name: Joanne Clark Gender: Female DOB: 02-05-1955 Age: 65 Y 10 M 14 D Return Phone Number: [phone] (Primary), [phone] (Secondary) Address[place] [address] Client Joanne [facility] - Client Client Site [place] [facility]ician Waunita Schooner- MD Contact Type Call Who Is Calling Patient / Member / Family / Caregiver Call Type Triage / Clinical Relationship To Patient Self Return Phone Number [phone] (Primary) Chief Complaint Dizziness Reason for Call Symptomatic / Request for [place] states she is really dizzy, equilibrium off, difficulty walking, some light sensitivity, rushing sound in ear Additional Comment Transfer from office for triage due to no available appts today Translation No Nurse Assessment Nurse: Chestine Spore, RN, [place] Date/Time (Eastern Time): 04/06/2020 9:43:38 AM Confirm and document reason for call. If symptomatic, describe symptoms. ---caller states having dizziness. thinks arm is somewhat "numb". weak. hard to walk r/t dizziness Does the patient have any new or worsening symptoms? ---Yes Will a triage be completed? ---Yes Related visit to physician within the last 2 weeks? ---No Does the PT have any chronic conditions? (i.e. diabetes, asthma, this includes High risk factors for pregnancy, etc.) ---Yes List chronic conditions. ---heart condition Is this a behavioral health or substance abuse call? ---No Guidelines Guideline Title Affirmed Question Affirmed Notes Nurse Date/Time (Eastern Time) Neurologic Deficit [1] Weakness (i.e., paralysis, loss of muscle strength) of the face, arm / hand, or leg / foot on one side of the body AND [2] sudden onset AND [3] present now (Exception: Bell's palsy suspected [i.e., weakness only on one side of  the face, developing over [place], [place], [place] 04/06/2020 9:47:10 AM PLEASE NOTE: All timestamps contained within this report are represented as [place] Standard Time. CONFIDENTIALTY NOTICE: This fax transmission is intended only for the addressee. It contains information that is legally privileged, confidential or otherwise protected from use or disclosure. If you are not the intended recipient, you are strictly prohibited from reviewing, disclosing, copying using or disseminating any of this information or taking any action in reliance on or regarding this information. If you have received this fax in error, please notify us immediately by telephone so that we can arrange for its return to [place]. Phone: [phone], Toll-Free: [phone], Fax: [phone] Page: 2 of 2 Call Id: [id number] Guidelines Guideline Title Affirmed Question Affirmed Notes Nurse Date/Time Eilene Ghazi Time) hours to days, no other symptoms]) Disp. Time Eilene Ghazi Time) Disposition Final User 04/06/2020 9:52:47 AM 911 Outcome Documentation Matherly, RN, [place] Reason: declined 911. caller states another adult will drive caller to ER 02/14/2079 9:51:46 AM Call EMS 911 Now Yes Matherly, RN, [place] Caller Disagree/Comply Disagree Caller Understands Yes PreDisposition Call Doctor Care Advice Given Per Guideline CALL EMS 911 NOW: * Immediate medical attention is needed. You need to hang up and call 911 (or an ambulance). CARE ADVICE given per Neurologic Deficit (Adult) guideline. Referrals GO TO FACILITY UNDECIDED

## 2020-04-06 NOTE — ED Triage Notes (Signed)
Pt c/o having "rushing noise" in my ears 2 days ago and then some ringing in the ear, states yesterday she became very weak and having severe dizziness.

## 2020-04-06 NOTE — ED Notes (Signed)
Pt transported to MRI 

## 2020-04-06 NOTE — Discharge Instructions (Addendum)
Apply the voltaren gel and take tylenol for your neck pain  Take the meclizine for dizziness  START over-the-counter steroid nasal spray such as FLONASE once daily for the next 2 weeks  START the antibiotics as prescribed

## 2020-04-06 NOTE — ED Notes (Signed)
Pt ambulatory to the restroom with standby assistance. Pt feels unsteady on her feet after the CT scan.

## 2020-04-06 NOTE — ED Notes (Signed)
Pt transported to XRay 

## 2020-04-06 NOTE — ED Notes (Signed)
Report received from Ashley RN.

## 2020-04-06 NOTE — Telephone Encounter (Signed)
Per pt chart review tab pt is presently at [facility] ED.

## 2020-04-06 NOTE — ED Notes (Signed)
Sent rainbow to the lab. 

## 2020-04-06 NOTE — ED Notes (Signed)
Pt signed discharge paperwork on paper copy since there was no computer available for e-signature.

## 2020-04-06 NOTE — ED Provider Notes (Signed)
[facility] Emergency Department Provider Note  ____________________________________________   Event Date/Time   First MD Initiated Contact with Patient 04/06/20 1306     (approximate)  I have reviewed the triage vital signs and the nursing notes.   HISTORY  Chief Complaint Tinnitus and Weakness    HPI Joanne Clark is a 65 y.o. female here with right neck pain, dizziness, sensation of loudness in the right ear.  The patient states that approximately 2 weeks ago, she fell, striking her head and shoulder.  There is no loss consciousness.  Since then, she has had progressively worsening right anterior neck and shoulder pain, which she thought was due to the fall.  She has had some intermittent right-sided headaches as well.  She also states that over the last several days, she has noticed intermittent sensation like she is hearing a tornado in her ears.  She feels like she is also begun to lose balance and has felt very dizzy and had difficulty walking.  This is all new.  Denies any focal numbness or weakness.  She does continue to feel unsteady.  Her primary complaint is right lateral posterior neck pain.  No vision changes.  No slurred speech.        Past Medical History:  Diagnosis Date  . Depression   . GERD (gastroesophageal reflux disease)   . Headache   . Hiatal hernia   . Hyperlipemia   . Hypertension   . Multiple gastric ulcers   . Myocardial infarction ([place])   . STEMI (ST elevation myocardial infarction) ([place]) 04/10/2018    Patient Active Problem List   Diagnosis Date Noted  . Stress and adjustment reaction 01/16/2020  . Mixed hyperlipidemia 02/14/2019  . Atrial fibrillation ([place]) 05/20/2018  . Hiatal hernia 05/20/2018  . Migraine headache 05/20/2018  . Chronic combined systolic and diastolic heart failure ([place]) 05/20/2018  . Presence of stent in coronary artery 05/20/2018  . PUD (peptic ulcer disease) 05/20/2018  . History of ST  elevation myocardial infarction (STEMI) 04/10/2018  . History of cholecystectomy 01/28/2010    Past Surgical History:  Procedure Laterality Date  . CHOLECYSTECTOMY  2010  . COLONOSCOPY W/ ENDOSCOPIC [place]  10/17/2017    Prior to Admission medications   Medication Sig Start Date End Date Taking? Authorizing Provider  amoxicillin-clavulanate (AUGMENTIN) 875-125 MG tablet Take 1 tablet by mouth 2 (two) times daily for 10 days. 04/06/20 04/16/20 Yes Duffy Bruce, MD  diclofenac Sodium (VOLTAREN) 1 % GEL Apply 4 g topically 4 (four) times daily for 7 days. To areas of pain 04/06/20 04/13/20 Yes Duffy Bruce, MD  meclizine (ANTIVERT) 12.5 MG tablet Take 1 tablet (12.5 mg total) by mouth 3 (three) times daily as needed for dizziness. 04/06/20  Yes Duffy Bruce, MD  apixaban (ELIQUIS) 5 MG TABS tablet Take 1 tablet by mouth 2 (two) times daily. cardio 05/07/18   [provider]  aspirin EC 81 MG tablet Take 81 mg by mouth daily.    [provider]  atorvastatin (LIPITOR) 80 MG tablet Take 1 tablet by mouth daily. Dr Claud Kelp [facility] cardio 05/07/18   [provider]  furosemide (LASIX) 20 MG tablet Take by mouth as needed. cardio 04/12/18 06/18/19  [provider]  lisinopril (ZESTRIL) 5 MG tablet Take 1 tablet by mouth daily. cardio 05/02/18   [provider]  metoprolol succinate (TOPROL-XL) 25 MG 24 hr tablet Take 12.5 mg by mouth daily. cardio 05/07/18 06/18/19  [provider]  nitroGLYCERIN (NITROSTAT) 0.4 MG SL tablet Place under the tongue. cardio 04/12/18   [provider]  omeprazole (PRILOSEC) 40 MG capsule Take 1 capsule (40 mg total) by mouth 2 (two) times daily before a meal. 03/31/20   Vanga, Tally Due, MD    Allergies Gluten meal and Methylprednisolone  Family History  Problem Relation Age of Onset  . Dementia Mother   . Diabetes Mother   . Dementia Father   . Diabetes Father   . Heart disease Father   . Cancer Sister         unknown type  . Diabetes Sister   . Hyperlipidemia Sister   . Diabetes Brother     Social History Social History   Tobacco Use  . Smoking status: Never Smoker  . Smokeless tobacco: Never Used  Vaping Use  . Vaping Use: Never used  Substance Use Topics  . Alcohol use: No  . Drug use: No    Review of Systems  Review of Systems  Constitutional: Positive for fatigue. Negative for fever.  HENT: Negative for congestion and sore throat.   Eyes: Negative for visual disturbance.  Respiratory: Negative for cough and shortness of breath.   Cardiovascular: Negative for chest pain.  Gastrointestinal: Negative for abdominal pain, diarrhea, nausea and vomiting.  Genitourinary: Negative for flank pain.  Musculoskeletal: Positive for neck pain. Negative for back pain.  Skin: Negative for rash and wound.  Neurological: Positive for weakness and headaches.  All other systems reviewed and are negative.    ____________________________________________  PHYSICAL EXAM:      VITAL SIGNS: ED Triage Vitals  Enc Vitals Group     BP 04/06/20 1046 (!) 109/53     Pulse Rate 04/06/20 1046 66     Resp 04/06/20 1046 18     Temp 04/06/20 1046 98.2 F (36.8 C)     Temp Source 04/06/20 1046 Oral     SpO2 04/06/20 1046 100 %     Weight --      Height --      Head Circumference --      Peak Flow --      Pain Score 04/06/20 1059 4     Pain Loc --      Pain Edu? --      Excl. in [place]? --      Physical Exam Vitals and nursing note reviewed.  Constitutional:      General: She is not in acute distress.    Appearance: She is well-developed.  HENT:     Head: Normocephalic and atraumatic.  Eyes:     Conjunctiva/sclera: Conjunctivae normal.  Neck:     Comments: No appreciable carotid bruits Cardiovascular:     Rate and Rhythm: Normal rate and regular rhythm.     Heart sounds: Normal heart sounds. No murmur heard. No friction rub.  Pulmonary:     Effort: Pulmonary effort is normal. No  respiratory distress.     Breath sounds: Normal breath sounds. No wheezing or rales.  Abdominal:     General: There is no distension.     Palpations: Abdomen is soft.     Tenderness: There is no abdominal tenderness.  Musculoskeletal:     Cervical back: Neck supple.  Skin:    General: Skin is warm.     Capillary Refill: Capillary refill takes less than 2 seconds.  Neurological:     Mental Status: She is alert and oriented to person, place, and time.  Motor: No abnormal muscle tone.     Comments: Neurological Exam:  Mental Status: Alert and oriented to person, place, and time. Attention and concentration normal. Speech clear. Recent memory is intact. Cranial Nerves: Visual fields grossly intact. EOMI and PERRLA. No nystagmus noted. Facial sensation intact at forehead, maxillary cheek, and chin/mandible bilaterally. No facial asymmetry or weakness. Hearing grossly normal. Uvula is midline, and palate elevates symmetrically. Normal SCM and trapezius strength. Tongue midline without fasciculations. Motor: Muscle strength 5/5 in proximal and distal UE and LE bilaterally. No pronator drift. Muscle tone normal. Sensation: Intact to light touch in upper and lower extremities distally bilaterally.  Gait: Normal without ataxia. Coordination: Normal FTN bilaterally.          ____________________________________________   LABS (all labs ordered are listed, but only abnormal results are displayed)  Labs Reviewed  BASIC METABOLIC PANEL - Abnormal; Notable for the following components:      Result Value   Glucose, Bld 120 (*)    All other components within normal limits  URINALYSIS, COMPLETE (UACMP) WITH MICROSCOPIC - Abnormal; Notable for the following components:   Color, Urine COLORLESS (*)    APPearance CLEAR (*)    All other components within normal limits  CBC  CBG MONITORING, ED    ____________________________________________  EKG: Normal sinus rhythm, VR 68. PR 140, QRS  110, QTc 421. No acute ST elevations or depressions. No ischemia or infarct. ________________________________________  RADIOLOGY All imaging, including plain films, CT scans, and ultrasounds, independently reviewed by me, and interpretations confirmed via formal radiology reads.  ED MD interpretation:   CT angio head/neck: No acute intracranial or large vessel abnormality.  Multiple small nodules noted in the parotid glands incidentally noted MR Brain: No acute abnormality  Official radiology report(s): CT Angio Head W or Wo Contrast  Result Date: 04/06/2020 CLINICAL DATA:  Dizziness, nonspecific. Additional history provided: Patient reports increased dizziness and loss of balance, patient reports fall yesterday, now left-sided pain. EXAM: CT ANGIOGRAPHY HEAD AND NECK TECHNIQUE: Multidetector CT imaging of the head and neck was performed using the standard protocol during bolus administration of intravenous contrast. Multiplanar CT image reconstructions and MIPs were obtained to evaluate the vascular anatomy. Carotid stenosis measurements (when applicable) are obtained utilizing NASCET criteria, using the distal internal carotid diameter as the denominator. CONTRAST:  49mL OMNIPAQUE IOHEXOL 350 MG/ML SOLN COMPARISON:  No pertinent prior exams available for comparison. FINDINGS: CT HEAD FINDINGS Brain: Cerebral volume is normal. There is no acute intracranial hemorrhage. No demarcated cortical infarct. No extra-axial fluid collection. No evidence of intracranial mass. No midline shift. Vascular: No hyperdense vessel. Skull: Normal. Negative for fracture or focal lesion. Sinuses: Small volume frothy secretions within the left sphenoid sinus. Mild mucosal thickening within the right greater than left maxillary sinuses. Orbits: No mass or acute finding. Review of the MIP images confirms the above findings CTA NECK FINDINGS Aortic arch: Standard aortic branching. Mild atherosclerotic plaque within the  visualized aortic arch and innominate artery. No hemodynamically significant innominate or proximal subclavian artery stenosis. Right carotid system: CCA and ICA patent within the neck without stenosis. Minimal soft and calcified plaque within the carotid bifurcation. Left carotid system: CCA and ICA patent within the neck without stenosis. Minimal soft and calcified plaque within the carotid bifurcation and proximal ICA. Vertebral arteries: Codominant and patent within the neck without stenosis. Skeleton: Spondylosis. No acute bony abnormality or aggressive osseous lesion. Other neck: No neck mass. Subcentimeter left thyroid lobe nodule not meeting  consensus criteria for ultrasound follow-up. Multiple small nodules within the bilateral parotid glands. Upper chest: No consolidation within the imaged lung apices. 6 mm right upper lobe pulmonary nodule (series 6, image 60). Review of the MIP images confirms the above findings CTA HEAD FINDINGS Anterior circulation: The intracranial internal carotid arteries are patent. The M1 middle cerebral arteries are patent. No M2 proximal branch occlusion or high-grade proximal stenosis is identified. The anterior cerebral arteries are patent. No intracranial aneurysm is identified. Posterior circulation: The intracranial vertebral arteries are patent. The basilar artery is patent. The posterior cerebral arteries are patent. Getting arteries are present bilaterally. Venous sinuses: Within the limitations of contrast timing, no convincing thrombus. Anatomic variants: None significant Review of the MIP images confirms the above findings IMPRESSION: CT head: 1. No evidence of acute intracranial abnormality. 2. Paranasal sinus disease as described. Correlate for acute sinusitis. CTA neck: 1. The bilateral common carotid, internal carotid and vertebral arteries are patent within the neck without stenosis. Mild atherosclerotic plaque within the bilateral carotid systems, as described.  2. Multiple small nodules within the parotid glands bilaterally. Findings are nonspecific and differential considerations include Sjogren syndrome, HIV, or lymphoma, among others. Clinical correlation is recommended. CTA head: No intracranial large vessel occlusion or proximal high-grade stenosis. Electronically Signed   By: Kellie Simmering DO   On: 04/06/2020 15:02   DG Shoulder Right  Result Date: 04/06/2020 CLINICAL DATA:  Right shoulder pain and limited range of motion EXAM: RIGHT SHOULDER - 2+ VIEW COMPARISON:  None. FINDINGS: There is no evidence of fracture or dislocation. Mild arthropathy of the [facility] joint. Glenohumeral joint space is preserved. Soft tissues are unremarkable. IMPRESSION: Negative. Electronically Signed   By: Davina Poke D.O.   On: 04/06/2020 16:08   CT Angio Neck W and/or Wo Contrast  Result Date: 04/06/2020 CLINICAL DATA:  Dizziness, nonspecific. Additional history provided: Patient reports increased dizziness and loss of balance, patient reports fall yesterday, now left-sided pain. EXAM: CT ANGIOGRAPHY HEAD AND NECK TECHNIQUE: Multidetector CT imaging of the head and neck was performed using the standard protocol during bolus administration of intravenous contrast. Multiplanar CT image reconstructions and MIPs were obtained to evaluate the vascular anatomy. Carotid stenosis measurements (when applicable) are obtained utilizing NASCET criteria, using the distal internal carotid diameter as the denominator. CONTRAST:  55mL OMNIPAQUE IOHEXOL 350 MG/ML SOLN COMPARISON:  No pertinent prior exams available for comparison. FINDINGS: CT HEAD FINDINGS Brain: Cerebral volume is normal. There is no acute intracranial hemorrhage. No demarcated cortical infarct. No extra-axial fluid collection. No evidence of intracranial mass. No midline shift. Vascular: No hyperdense vessel. Skull: Normal. Negative for fracture or focal lesion. Sinuses: Small volume frothy secretions within the left sphenoid  sinus. Mild mucosal thickening within the right greater than left maxillary sinuses. Orbits: No mass or acute finding. Review of the MIP images confirms the above findings CTA NECK FINDINGS Aortic arch: Standard aortic branching. Mild atherosclerotic plaque within the visualized aortic arch and innominate artery. No hemodynamically significant innominate or proximal subclavian artery stenosis. Right carotid system: CCA and ICA patent within the neck without stenosis. Minimal soft and calcified plaque within the carotid bifurcation. Left carotid system: CCA and ICA patent within the neck without stenosis. Minimal soft and calcified plaque within the carotid bifurcation and proximal ICA. Vertebral arteries: Codominant and patent within the neck without stenosis. Skeleton: Spondylosis. No acute bony abnormality or aggressive osseous lesion. Other neck: No neck mass. Subcentimeter left thyroid lobe nodule not meeting consensus  criteria for ultrasound follow-up. Multiple small nodules within the bilateral parotid glands. Upper chest: No consolidation within the imaged lung apices. 6 mm right upper lobe pulmonary nodule (series 6, image 60). Review of the MIP images confirms the above findings CTA HEAD FINDINGS Anterior circulation: The intracranial internal carotid arteries are patent. The M1 middle cerebral arteries are patent. No M2 proximal branch occlusion or high-grade proximal stenosis is identified. The anterior cerebral arteries are patent. No intracranial aneurysm is identified. Posterior circulation: The intracranial vertebral arteries are patent. The basilar artery is patent. The posterior cerebral arteries are patent. Getting arteries are present bilaterally. Venous sinuses: Within the limitations of contrast timing, no convincing thrombus. Anatomic variants: None significant Review of the MIP images confirms the above findings IMPRESSION: CT head: 1. No evidence of acute intracranial abnormality. 2.  Paranasal sinus disease as described. Correlate for acute sinusitis. CTA neck: 1. The bilateral common carotid, internal carotid and vertebral arteries are patent within the neck without stenosis. Mild atherosclerotic plaque within the bilateral carotid systems, as described. 2. Multiple small nodules within the parotid glands bilaterally. Findings are nonspecific and differential considerations include Sjogren syndrome, HIV, or lymphoma, among others. Clinical correlation is recommended. CTA head: No intracranial large vessel occlusion or proximal high-grade stenosis. Electronically Signed   By: Kellie Simmering DO   On: 04/06/2020 15:02   MR BRAIN WO CONTRAST  Result Date: 04/06/2020 CLINICAL DATA:  Dizziness, nonspecific. Additional history provided: Patient reports dizziness and weakness which began last night, recent fall hitting right side of shoulder, right shoulder pain that radiates up neck. EXAM: MRI HEAD WITHOUT CONTRAST TECHNIQUE: Multiplanar, multiecho pulse sequences of the brain and surrounding structures were obtained without intravenous contrast. COMPARISON:  CT angiogram head/neck performed earlier today 04/06/2020. FINDINGS: Brain: Cerebral volume is normal for age. Mild multifocal T2/FLAIR hyperintensity within the cerebral white matter is nonspecific, but compatible with chronic small vessel ischemic disease. There is no acute infarct. No evidence of intracranial mass. No chronic intracranial blood products. No extra-axial fluid collection. No midline shift. Vascular: Expected proximal arterial flow voids. Skull and upper cervical spine: No focal marrow lesion. Sinuses/Orbits: Visualized orbits show no acute finding. Small volume fluid within the left sphenoid sinus. Mild bilateral maxillary sinus mucosal thickening. Superimposed small right maxillary sinus mucous retention cyst. Other: Trace fluid within the bilateral mastoid air cells. IMPRESSION: No evidence of acute intracranial abnormality.  Mild cerebral white matter chronic small vessel ischemic disease. Left sphenoid and bilateral maxillary sinus disease, as described. Electronically Signed   By: Kellie Simmering DO   On: 04/06/2020 18:52    ____________________________________________  PROCEDURES   Procedure(s) performed (including Critical Care):  Procedures  ____________________________________________  INITIAL IMPRESSION / MDM / [place] / ED COURSE  As part of my medical decision making, I reviewed the following data within the [place] notes reviewed and incorporated, Old chart reviewed, Notes from prior ED visits, and [place] Controlled Substance Database       *Rayyan Orsborn was evaluated in Emergency Department on 04/06/2020 for the symptoms described in the history of present illness. She was evaluated in the context of the global COVID-19 pandemic, which necessitated consideration that the patient might be at risk for infection with the SARS-CoV-2 virus that causes COVID-19. Institutional protocols and algorithms that pertain to the evaluation of patients at risk for COVID-19 are in a state of rapid change based on information released by regulatory bodies including the CDC and federal  and state organizations. These policies and algorithms were followed during the patient's care in the ED.  Some ED evaluations and interventions may be delayed as a result of limited staffing during the pandemic.*     Medical Decision Making: 65 year old female here with right neck and shoulder pain, dizziness, and hearing changes.  Initial differential is broad, primarily including carotid or vertebral dissection in the setting of trauma, basilar or cerebellar stroke, sinusitis with middle ear dysfunction.  Initial CT scans reviewed as above, showed no evidence of significant vessel abnormality.  She incidentally has nodules noted in her parotid glands, which could be due to an underlying autoimmune  condition for which she has been worked up in the past, though she denies any current symptoms related to this.  Will have her follow-up as a PCP for this.  Otherwise, given her persistent dizziness and risk factors, will obtain MRI to evaluate for possible concomitant stroke.  She does have sinusitis noted on CT as well, which could be contributing somewhat to her headaches and disequilibrium, though no overt evidence of otitis on exam.  CT scan shows normal vessels. Incidental note of parotid nodules - can f/u as outpt. Otherwise, given her multiple neuro sx and risk factors, MR obtained as well which fortunately shows no signs of stroke. She does have extensive sinus disease, and I suspect her aural fullness, dizziness is related to this. Will start her on augmentin, advise flonase, and have her f/u with PCP. Return precautions given. Will trial voltaren gel for her neck pain, which seems to be MSK in etiology after fall.  ____________________________________________  FINAL CLINICAL IMPRESSION(S) / ED DIAGNOSES  Final diagnoses:  Neck pain  Acute non-recurrent maxillary sinusitis  Dizziness     MEDICATIONS GIVEN DURING THIS VISIT:  Medications  sodium chloride 0.9 % bolus 1,000 mL (0 mLs Intravenous Stopped 04/06/20 1955)  ondansetron (ZOFRAN) injection 4 mg (4 mg Intravenous Given 04/06/20 1346)  morphine 4 MG/ML injection 4 mg (4 mg Intravenous Given 04/06/20 1350)  iohexol (OMNIPAQUE) 350 MG/ML injection 75 mL (75 mLs Intravenous Contrast Given 04/06/20 1407)  acetaminophen (TYLENOL) tablet 1,000 mg (1,000 mg Oral Given 04/06/20 1532)  morphine 2 MG/ML injection 2 mg (2 mg Intravenous Given 04/06/20 1532)     ED Discharge Orders         Ordered    diclofenac Sodium (VOLTAREN) 1 % GEL  4 times daily        04/06/20 1926    amoxicillin-clavulanate (AUGMENTIN) 875-125 MG tablet  2 times daily        04/06/20 1926    meclizine (ANTIVERT) 12.5 MG tablet  3 times daily PRN        04/06/20  1926           Note:  This document was prepared using Dragon voice recognition software and may include unintentional dictation errors.   Duffy Bruce, MD 04/06/20 2249

## 2020-04-06 NOTE — ED Notes (Signed)
Pt presents to ED for severe dizziness and weakness that pt states started last night. Pt recently had a fall where she hit the R side of her shoulder pt states now she has pain in the R shoulder that radiates up neck. Pt was not seen after this fall. Pt is A&Ox4 and NAD.

## 2020-04-08 ENCOUNTER — Telehealth: Payer: Self-pay

## 2020-04-08 DIAGNOSIS — K449 Diaphragmatic hernia without obstruction or gangrene: Secondary | ICD-10-CM | POA: Diagnosis not present

## 2020-04-08 DIAGNOSIS — I25118 Atherosclerotic heart disease of native coronary artery with other forms of angina pectoris: Secondary | ICD-10-CM | POA: Diagnosis not present

## 2020-04-08 DIAGNOSIS — I255 Ischemic cardiomyopathy: Secondary | ICD-10-CM | POA: Diagnosis not present

## 2020-04-08 DIAGNOSIS — I252 Old myocardial infarction: Secondary | ICD-10-CM | POA: Diagnosis not present

## 2020-04-08 DIAGNOSIS — I5042 Chronic combined systolic (congestive) and diastolic (congestive) heart failure: Secondary | ICD-10-CM | POA: Diagnosis not present

## 2020-04-08 NOTE — Telephone Encounter (Signed)
Per cardiologist You should STOP apixaban 48 hours prior to your procedure time. When you are not taking apixaban, you should take aspirin 81 mg daily if OK with your proceduralist / surgeon.   Called and left a message for call back

## 2020-04-09 ENCOUNTER — Ambulatory Visit: Admission: RE | Admit: 2020-04-09 | Payer: Medicare Other | Source: Home / Self Care | Admitting: Gastroenterology

## 2020-04-09 HISTORY — DX: Headache, unspecified: R51.9

## 2020-04-09 SURGERY — ESOPHAGOGASTRODUODENOSCOPY (EGD) WITH PROPOFOL
Anesthesia: General

## 2020-04-09 NOTE — Telephone Encounter (Signed)
Patient verbalized understanding and states she was informed yesterday

## 2020-04-15 ENCOUNTER — Ambulatory Visit: Payer: Medicare Other | Admitting: Family Medicine

## 2020-04-15 ENCOUNTER — Ambulatory Visit: Payer: Medicare Other | Admitting: Surgery

## 2020-04-21 ENCOUNTER — Ambulatory Visit: Payer: Medicare Other | Admitting: Family Medicine

## 2020-04-24 ENCOUNTER — Other Ambulatory Visit
Admission: RE | Admit: 2020-04-24 | Payer: Medicare Other | Source: Ambulatory Visit | Attending: Gastroenterology | Admitting: Gastroenterology

## 2020-04-24 ENCOUNTER — Other Ambulatory Visit: Payer: Medicare Other

## 2020-04-28 ENCOUNTER — Encounter: Payer: Self-pay | Admitting: Gastroenterology

## 2020-04-28 ENCOUNTER — Encounter
Admission: RE | Disposition: A | Discharge status: Home / Self Care | Payer: Self-pay | Source: Home / Self Care | Attending: Gastroenterology

## 2020-04-28 ENCOUNTER — Telehealth: Payer: Self-pay

## 2020-04-28 ENCOUNTER — Ambulatory Visit: Payer: Medicare Other | Admitting: Certified Registered Nurse Anesthetist

## 2020-04-28 ENCOUNTER — Ambulatory Visit
Admission: RE | Admit: 2020-04-28 | Discharge: 2020-04-28 | Disposition: A | Discharge status: Home / Self Care | Payer: Medicare Other | Attending: Gastroenterology | Admitting: Gastroenterology

## 2020-04-28 DIAGNOSIS — Z8711 Personal history of peptic ulcer disease: Secondary | ICD-10-CM | POA: Insufficient documentation

## 2020-04-28 DIAGNOSIS — Z7952 Long term (current) use of systemic steroids: Secondary | ICD-10-CM | POA: Insufficient documentation

## 2020-04-28 DIAGNOSIS — Z809 Family history of malignant neoplasm, unspecified: Secondary | ICD-10-CM | POA: Diagnosis not present

## 2020-04-28 DIAGNOSIS — Z8349 Family history of other endocrine, nutritional and metabolic diseases: Secondary | ICD-10-CM | POA: Diagnosis not present

## 2020-04-28 DIAGNOSIS — Z79899 Other long term (current) drug therapy: Secondary | ICD-10-CM | POA: Insufficient documentation

## 2020-04-28 DIAGNOSIS — K219 Gastro-esophageal reflux disease without esophagitis: Secondary | ICD-10-CM | POA: Diagnosis not present

## 2020-04-28 DIAGNOSIS — K2930 Chronic superficial gastritis without bleeding: Secondary | ICD-10-CM | POA: Diagnosis not present

## 2020-04-28 DIAGNOSIS — Z7901 Long term (current) use of anticoagulants: Secondary | ICD-10-CM | POA: Insufficient documentation

## 2020-04-28 DIAGNOSIS — K2950 Unspecified chronic gastritis without bleeding: Secondary | ICD-10-CM | POA: Insufficient documentation

## 2020-04-28 DIAGNOSIS — Z8249 Family history of ischemic heart disease and other diseases of the circulatory system: Secondary | ICD-10-CM | POA: Insufficient documentation

## 2020-04-28 DIAGNOSIS — R1010 Upper abdominal pain, unspecified: Secondary | ICD-10-CM

## 2020-04-28 DIAGNOSIS — R1013 Epigastric pain: Secondary | ICD-10-CM | POA: Diagnosis not present

## 2020-04-28 DIAGNOSIS — K3189 Other diseases of stomach and duodenum: Secondary | ICD-10-CM | POA: Insufficient documentation

## 2020-04-28 DIAGNOSIS — Z833 Family history of diabetes mellitus: Secondary | ICD-10-CM | POA: Diagnosis not present

## 2020-04-28 DIAGNOSIS — T18128A Food in esophagus causing other injury, initial encounter: Secondary | ICD-10-CM | POA: Diagnosis not present

## 2020-04-28 DIAGNOSIS — Z7982 Long term (current) use of aspirin: Secondary | ICD-10-CM | POA: Insufficient documentation

## 2020-04-28 DIAGNOSIS — G8929 Other chronic pain: Secondary | ICD-10-CM

## 2020-04-28 HISTORY — PX: ESOPHAGOGASTRODUODENOSCOPY (EGD) WITH PROPOFOL: SHX5813

## 2020-04-28 SURGERY — ESOPHAGOGASTRODUODENOSCOPY (EGD) WITH PROPOFOL
Anesthesia: General

## 2020-04-28 MED ORDER — SODIUM CHLORIDE 0.9 % IV SOLN: INTRAVENOUS | Status: DC

## 2020-04-28 MED ORDER — PROPOFOL 10 MG/ML IV BOLUS
INTRAVENOUS | Status: DC | PRN
  Administered 2020-04-28: 70 mg via INTRAVENOUS

## 2020-04-28 MED ORDER — LIDOCAINE HCL (CARDIAC) PF 100 MG/5ML IV SOSY
PREFILLED_SYRINGE | INTRAVENOUS | Status: DC | PRN
  Administered 2020-04-28: 50 mg via INTRAVENOUS

## 2020-04-28 MED ORDER — PROPOFOL 500 MG/50ML IV EMUL
INTRAVENOUS | Status: DC | PRN
  Administered 2020-04-28: 150 ug/kg/min via INTRAVENOUS

## 2020-04-28 MED ORDER — PROPOFOL 500 MG/50ML IV EMUL
INTRAVENOUS | Status: AC
  Filled 2020-04-28: qty 50

## 2020-04-28 NOTE — Transfer of Care (Signed)
Immediate Anesthesia Transfer of Care Note  Patient: Kursten Kruk  Procedure(s) Performed: ESOPHAGOGASTRODUODENOSCOPY (EGD) WITH PROPOFOL (N/A )  Patient Location: PACU  Anesthesia Type:General  Level of Consciousness: sedated  Airway & Oxygen Therapy: Patient Spontanous Breathing  Post-op Assessment: Report given to RN and Post -op Vital signs reviewed and stable  Post vital signs: Reviewed and stable  Last Vitals:  Vitals Value Taken Time  BP 110/67 04/28/20 0833  Temp    Pulse 68 04/28/20 0833  Resp 19 04/28/20 0833  SpO2 94 % 04/28/20 0833    Last Pain:  Vitals:   04/28/20 0750  TempSrc: Temporal  PainSc: 0-No pain         Complications: No complications documented.

## 2020-04-28 NOTE — Anesthesia Procedure Notes (Signed)
Date/Time: 04/28/2020 8:17 AM Performed by: Johnna Acosta, CRNA Pre-anesthesia Checklist: Patient identified, Emergency Drugs available, Suction available, Patient being monitored and Timeout performed Patient Re-evaluated:Patient Re-evaluated prior to induction Oxygen Delivery Method: Nasal cannula Preoxygenation: Pre-oxygenation with 100% oxygen Induction Type: IV induction

## 2020-04-28 NOTE — H&P (Signed)
Joanne Darby, MD [address]  [place], [place] [address]  Main: [phone]  Fax: [phone] Pager: [phone]  Primary Care Physician:  Lesleigh Noe, MD Primary Gastroenterologist:  Dr. Cephas Clark  Pre-Procedure History & Physical: HPI:  Joanne Clark is a 65 y.o. female is here for an endoscopy.   Past Medical History:  Diagnosis Date  . Depression   . GERD (gastroesophageal reflux disease)   . Headache   . Hiatal hernia   . Hyperlipemia   . Hypertension   . Multiple gastric ulcers   . Myocardial infarction ([place])   . STEMI (ST elevation myocardial infarction) ([place]) 04/10/2018    Past Surgical History:  Procedure Laterality Date  . CHOLECYSTECTOMY  2010  . COLONOSCOPY W/ ENDOSCOPIC [place]  10/17/2017    Prior to Admission medications   Medication Sig Start Date End Date Taking? Authorizing Provider  atorvastatin (LIPITOR) 80 MG tablet Take 1 tablet by mouth daily. Dr Claud Kelp [facility] cardio 05/07/18  Yes [provider]  lisinopril (ZESTRIL) 5 MG tablet Take 1 tablet by mouth daily. cardio 05/02/18  Yes [provider]  metoprolol succinate (TOPROL-XL) 25 MG 24 hr tablet Take 12.5 mg by mouth daily. cardio 05/07/18 04/28/20 Yes [provider]  omeprazole (PRILOSEC) 40 MG capsule Take 1 capsule (40 mg total) by mouth 2 (two) times daily before a meal. 03/31/20  Yes Passion Lavin, Tally Due, MD  apixaban (ELIQUIS) 5 MG TABS tablet Take 1 tablet by mouth 2 (two) times daily. cardio 05/07/18   [provider]  aspirin EC 81 MG tablet Take 81 mg by mouth daily.    [provider]  furosemide (LASIX) 20 MG tablet Take by mouth as needed. cardio 04/12/18 06/18/19  [provider]  meclizine (ANTIVERT) 12.5 MG tablet Take 1 tablet (12.5 mg total) by mouth 3 (three) times daily as needed for dizziness. 04/06/20   Duffy Bruce, MD  nitroGLYCERIN (NITROSTAT) 0.4 MG SL tablet Place under the tongue. cardio 04/12/18    [provider]    Allergies as of 04/02/2020 - Review Complete 03/31/2020  Allergen Reaction Noted  . Gluten meal  08/17/2006  . Methylprednisolone Other (See Comments) 12/23/2017    Family History  Problem Relation Age of Onset  . Dementia Mother   . Diabetes Mother   . Dementia Father   . Diabetes Father   . Heart disease Father   . Cancer Sister        unknown type  . Diabetes Sister   . Hyperlipidemia Sister   . Diabetes Brother     Social History   Socioeconomic History  . Marital status: Married    Spouse name: Joanne Clark  . Number of children: 1  . Years of education: some college  . Highest education level: Not on file  Occupational History  . Not on file  Tobacco Use  . Smoking status: Never Smoker  . Smokeless tobacco: Never Used  Vaping Use  . Vaping Use: Never used  Substance and Sexual Activity  . Alcohol use: No  . Drug use: No  . Sexual activity: Not Currently  Other Topics Concern  . Not on file  Social History Narrative   01/16/20   From: [place] originally, moved 2021 to be near daughter   Living: with husband, Joanne Clark [phone])   Work: retired Scientist, research (physical sciences)      Family: daughter [name]      Enjoys: reading, games, walking the park  Exercise: walking 2-3 times a week   Diet: tires to do heart healthy diet      Safety   Seat belts: Yes    Guns: Yes  and secure   Safe in relationships: Yes    Social Determinants of Health   Financial Resource Strain: Low Risk   . Difficulty of Paying Living Expenses: Not hard at all  Food Insecurity: No Food Insecurity  . Worried About Charity fundraiser in the Last Year: Never true  . Ran Out of Food in the Last Year: Never true  Transportation Needs: No Transportation Needs  . Lack of Transportation (Medical): No  . Lack of Transportation (Non-Medical): No  Physical Activity: Inactive  . Days of Exercise per Week: 0 days  . Minutes of Exercise per Session: 0 min  Stress: No Stress  Concern Present  . Feeling of Stress : Not at all  Social Connections: Unknown  . Frequency of Communication with Friends and Family: Not on file  . Frequency of Social Gatherings with Friends and Family: Not on file  . Attends Religious Services: Not on file  . Active Member of Clubs or Organizations: Not on file  . Attends Archivist Meetings: Not on file  . Marital Status: Married  Human resources officer Violence: Not At Risk  . Fear of Current or Ex-Partner: No  . Emotionally Abused: No  . Physically Abused: No  . Sexually Abused: No    Review of Systems: See HPI, otherwise negative ROS  Physical Exam: BP 125/89   Pulse 71   Temp (!) 96.6 F (35.9 C) (Temporal)   Resp 18   Ht 5\' 8"  (1.727 m)   Wt 104.3 kg   SpO2 99%   BMI 34.97 kg/m  General:   Alert,  pleasant and cooperative in NAD Head:  Normocephalic and atraumatic. Neck:  Supple; no masses or thyromegaly. Lungs:  Clear throughout to auscultation.    Heart:  Regular rate and rhythm. Abdomen:  Soft, nontender and nondistended. Normal bowel sounds, without guarding, and without rebound.   Neurologic:  Alert and  oriented x4;  grossly normal neurologically.  Impression/Plan: Sneha Willig is here for an endoscopy to be performed for upper abdominal pain  Risks, benefits, limitations, and alternatives regarding  endoscopy have been reviewed with the patient.  Questions have been answered.  All parties agreeable.   Sherri Sear, MD  04/28/2020, 8:16 AM

## 2020-04-28 NOTE — Telephone Encounter (Signed)
Put in the order for gastric emptying study. Need to stop omeprazole 8 hours before the exam. Nothing to eat or drink after midnight. May 13 at 8:00am. Please arrived to medical mall at 7:30am. Called back ENDO and inform Almyra Free of instructions and she will tell the patient

## 2020-04-28 NOTE — Op Note (Signed)
[facility] Gastroenterology Patient Name: Joanne Clark Procedure Date: 04/28/2020 8:17 AM MRN: [id number] Account #: [id number] Date of Birth: [birth year] Admit Type: Outpatient Age: 65 Room: [facility] ENDO ROOM 3 Gender: Female Note Status: Finalized Procedure:             Upper GI endoscopy Indications:           Epigastric abdominal pain Providers:             Lin Landsman MD, MD Referring MD:          Jobe Marker. Einar Pheasant (Referring MD) Medicines:             General Anesthesia Complications:         No immediate complications. Estimated blood loss: None. Procedure:             Pre-Anesthesia Assessment:                        - Prior to the procedure, a History and Physical was                         performed, and patient medications and allergies were                         reviewed. The patient is competent. The risks and                         benefits of the procedure and the sedation options and                         risks were discussed with the patient. All questions                         were answered and informed consent was obtained.                         Patient identification and proposed procedure were                         verified by the physician, the nurse, the                         anesthesiologist, the anesthetist and the technician                         in the pre-procedure area in the procedure room in the                         endoscopy suite. Mental Status Examination: alert and                         oriented. Airway Examination: normal oropharyngeal                         airway and neck mobility. Respiratory Examination:                         clear to auscultation. CV Examination: normal.  Prophylactic Antibiotics: The patient does not require                         prophylactic antibiotics. Prior Anticoagulants: The                         patient has taken Eliquis (apixaban), last dose  was 2                         days prior to procedure. ASA Grade Assessment: III - A                         patient with severe systemic disease. After reviewing                         the risks and benefits, the patient was deemed in                         satisfactory condition to undergo the procedure. The                         anesthesia plan was to use general anesthesia.                         Immediately prior to administration of medications,                         the patient was re-assessed for adequacy to receive                         sedatives. The heart rate, respiratory rate, oxygen                         saturations, blood pressure, adequacy of pulmonary                         ventilation, and response to care were monitored                         throughout the procedure. The physical status of the                         patient was re-assessed after the procedure.                        After obtaining informed consent, the endoscope was                         passed under direct vision. Throughout the procedure,                         the patient's blood pressure, pulse, and oxygen                         saturations were monitored continuously. The Endoscope                         was introduced through the mouth, and advanced to  the                         duodenal bulb. The upper GI endoscopy was accomplished                         without difficulty. The patient tolerated the                         procedure well. Findings:      Food (residue) was found in the duodenal bulb.      A medium amount of food (residue) was found in the gastric fundus and in       the gastric antrum.      Diffuse mildly erythematous mucosa without bleeding was found in the       gastric antrum. Biopsies were taken with a cold forceps for Helicobacter       pylori testing.      The gastric body and incisura were normal. Biopsies were taken with a       cold forceps for  Helicobacter pylori testing.      The gastroesophageal junction and examined esophagus were normal.      Esophagogastric landmarks were identified: the gastroesophageal junction       was found at 36 cm from the incisors. Impression:            - Retained food in the duodenum.                        - A medium amount of food (residue) in the stomach.                        - Erythematous mucosa in the antrum. Biopsied.                        - Normal gastric body and incisura. Biopsied.                        - Normal gastroesophageal junction and esophagus.                        - Esophagogastric landmarks identified. Recommendation:        - Discharge patient to home (with escort).                        - Resume previous diet today.                        - Continue present medications.                        - Await pathology results.                        - Resume Eliquis (apixaban) today at prior dose. Refer                         to managing physician for further adjustment of                         therapy.                        -  Do a gastric emptying study at appointment to be                         scheduled.                        - Return to my office as previously scheduled. Procedure Code(s):     --- Professional ---                        [phone], Esophagogastroduodenoscopy, flexible,                         transoral; with biopsy, single or multiple Diagnosis Code(s):     --- Professional ---                        K31.89, Other diseases of stomach and duodenum                        R10.13, Epigastric pain CPT copyright 2019 American Medical Association. All rights reserved. The codes documented in this report are preliminary and upon coder review may  be revised to meet current compliance requirements. Dr. Ulyess Mort Lin Landsman MD, MD 04/28/2020 8:30:47 AM This report has been signed electronically. Number of Addenda: 0 Note Initiated On: 04/28/2020  8:17 AM Estimated Blood Loss:  Estimated blood loss: none.      [facility]

## 2020-04-28 NOTE — Anesthesia Postprocedure Evaluation (Signed)
Anesthesia Post Note  Patient: Joanne Clark  Procedure(s) Performed: ESOPHAGOGASTRODUODENOSCOPY (EGD) WITH PROPOFOL (N/A )  Patient location during evaluation: Endoscopy Anesthesia Type: General Level of consciousness: awake and alert Pain management: pain level controlled Vital Signs Assessment: post-procedure vital signs reviewed and stable Respiratory status: spontaneous breathing, nonlabored ventilation, respiratory function stable and patient connected to nasal cannula oxygen Cardiovascular status: blood pressure returned to baseline and stable Postop Assessment: no apparent nausea or vomiting Anesthetic complications: no   No complications documented.   Last Vitals:  Vitals:   04/28/20 0833 04/28/20 0841  BP: 110/67 95/83  Pulse: 68   Resp: 19   Temp:    SpO2: 94%     Last Pain:  Vitals:   04/28/20 0901  TempSrc:   PainSc: 0-No pain                 Martha Clan

## 2020-04-28 NOTE — Anesthesia Preprocedure Evaluation (Signed)
Anesthesia Evaluation  Patient identified by MRN, date of birth, ID band Patient awake    Reviewed: Allergy & Precautions, H&P , NPO status , Patient's Chart, lab work & pertinent test results, reviewed documented beta blocker date and time   History of Anesthesia Complications Negative for: history of anesthetic complications  Airway Mallampati: II  TM Distance: >3 FB Neck ROM: full    Dental  (+) Dental Advidsory Given, Teeth Intact, Chipped, Missing Permanent retainer on the top:   Pulmonary neg pulmonary ROS,    Pulmonary exam normal breath sounds clear to auscultation       Cardiovascular Exercise Tolerance: Good hypertension, (-) angina+ Past MI and + Cardiac Stents  Normal cardiovascular exam(-) dysrhythmias (-) Valvular Problems/Murmurs Rhythm:regular Rate:Normal     Neuro/Psych PSYCHIATRIC DISORDERS Depression negative neurological ROS     GI/Hepatic Neg liver ROS, hiatal hernia, PUD, GERD  ,  Endo/Other  negative endocrine ROS  Renal/GU negative Renal ROS  negative genitourinary   Musculoskeletal   Abdominal   Peds  Hematology negative hematology ROS (+)   Anesthesia Other Findings Past Medical History: No date: Depression No date: GERD (gastroesophageal reflux disease) No date: Headache No date: Hiatal hernia No date: Hyperlipemia No date: Hypertension No date: Multiple gastric ulcers No date: Myocardial infarction ([place]) 04/10/2018: STEMI (ST elevation myocardial infarction) ([place])   Reproductive/Obstetrics negative OB ROS                             Anesthesia Physical Anesthesia Plan  ASA: II  Anesthesia Plan: General   Post-op Pain Management:    Induction: Intravenous  PONV Risk Score and Plan: 3 and TIVA and Propofol infusion  Airway Management Planned: Natural Airway and Nasal Cannula  Additional Equipment:   Intra-op Plan:   Post-operative Plan:    Informed Consent: I have reviewed the patients History and Physical, chart, labs and discussed the procedure including the risks, benefits and alternatives for the proposed anesthesia with the patient or authorized representative who has indicated his/her understanding and acceptance.     Dental Advisory Given  Plan Discussed with: Anesthesiologist, CRNA and Surgeon  Anesthesia Plan Comments:         Anesthesia Quick Evaluation

## 2020-04-28 NOTE — Telephone Encounter (Signed)
-----   Message from Lin Landsman, MD sent at 04/28/2020  8:56 AM EDT ----- Regarding: Gastric emptying study Joanne Clark  Please schedule gastric emptying study.  She underwent EGD today.  Call her tomorrow  Dx: Chronic upper abdominal pain  RV

## 2020-04-29 ENCOUNTER — Encounter: Payer: Self-pay | Admitting: Gastroenterology

## 2020-04-29 ENCOUNTER — Other Ambulatory Visit: Payer: Self-pay

## 2020-04-29 ENCOUNTER — Ambulatory Visit: Payer: Medicare Other | Admitting: Surgery

## 2020-04-29 MED ORDER — OMEPRAZOLE 40 MG PO CPDR: 40.0000 mg | DELAYED_RELEASE_CAPSULE | Freq: Two times a day (BID) | ORAL | 0 refills | Status: DC

## 2020-04-29 NOTE — Telephone Encounter (Signed)
Last office visit 03/31/2020 Upper abdominal pain  Last refill 03/31/2020 2 refills sent to [facility]  Patient is wanting 56 day mail order  Has appointment 07/01/2020

## 2020-04-30 LAB — SURGICAL PATHOLOGY

## 2020-05-04 ENCOUNTER — Other Ambulatory Visit: Payer: Self-pay

## 2020-05-04 ENCOUNTER — Ambulatory Visit (INDEPENDENT_AMBULATORY_CARE_PROVIDER_SITE_OTHER): Payer: Medicare Other | Admitting: Family Medicine

## 2020-05-04 ENCOUNTER — Telehealth: Payer: Self-pay

## 2020-05-04 ENCOUNTER — Telehealth: Payer: Self-pay | Admitting: Gastroenterology

## 2020-05-04 VITALS — BP 118/78 | HR 70 | Temp 97.5°F | Ht 68.0 in | Wt 234.8 lb

## 2020-05-04 DIAGNOSIS — K2970 Gastritis, unspecified, without bleeding: Secondary | ICD-10-CM | POA: Diagnosis not present

## 2020-05-04 DIAGNOSIS — K449 Diaphragmatic hernia without obstruction or gangrene: Secondary | ICD-10-CM

## 2020-05-04 DIAGNOSIS — K118 Other diseases of salivary glands: Secondary | ICD-10-CM | POA: Diagnosis not present

## 2020-05-04 DIAGNOSIS — F329 Major depressive disorder, single episode, unspecified: Secondary | ICD-10-CM

## 2020-05-04 MED ORDER — ESCITALOPRAM OXALATE 5 MG PO TABS: 5.0000 mg | ORAL_TABLET | Freq: Every day | ORAL | 1 refills | Status: DC

## 2020-05-04 NOTE — Telephone Encounter (Signed)
Patient verbalized understanding of results. Order lab and states she will come for lab today

## 2020-05-04 NOTE — Telephone Encounter (Signed)
Please call patient,  She is in clinic having ;lab work and wants to speak to you about "how she is feeling today."

## 2020-05-04 NOTE — Assessment & Plan Note (Signed)
Pt concerned about persistent symptoms and would like to try medication. Her PHQ-9 is only minimally elevated, but interrupting life. Will try lexapro 5 mg due to hx of side effects.

## 2020-05-04 NOTE — Assessment & Plan Note (Addendum)
S/p EGD waiting on pathology results. Continues to have reflux symptoms. Worse since the procedure. Advised she reach out to GI about recent medication change.

## 2020-05-04 NOTE — Progress Notes (Signed)
Subjective:     Joanne Clark is a 65 y.o. female presenting for Follow-up (3 month)     HPI   Her dog just passed away Had her EGD last week  Had her second covid booster  #hiatal hernia - was switched to omeprazole and does not feel this is working - impacting sleep   #stress  - never connected with therapy - has been busy but a lot going on  - is planning to try and do a referral   #parotid nodules - no nightsweats, weight loss - denies dry eyes, dry mouth - does have some cavities   ER - 04/06/2020 - dizziness and sinus infection - dizziness improved   Review of Systems   01/16/2020: Clinic - stress reaction - therapy encouraged, consider medication if not improved. Treat chronic conditions.  04/06/2020: ER - sinusitis - incidental parotid nodules found  Social History   Tobacco Use  Smoking Status Never Smoker  Smokeless Tobacco Never Used        Objective:    BP Readings from Last 3 Encounters:  05/04/20 118/78  04/28/20 95/83  04/06/20 (!) 103/59   Wt Readings from Last 3 Encounters:  05/04/20 234 lb 12 oz (106.5 kg)  04/28/20 230 lb (104.3 kg)  03/31/20 234 lb 6.4 oz (106.3 kg)    BP 118/78   Pulse 70   Temp (!) 97.5 F (36.4 C) (Temporal)   Ht 5\' 8"  (1.727 m)   Wt 234 lb 12 oz (106.5 kg)   SpO2 96%   BMI 35.69 kg/m    Physical Exam Constitutional:      General: She is not in acute distress.    Appearance: She is well-developed. She is not diaphoretic.  HENT:     Right Ear: External ear normal.     Left Ear: External ear normal.     Nose: Nose normal.  Eyes:     Conjunctiva/sclera: Conjunctivae normal.  Neck:     Comments: No palpable nodules or swelling Cardiovascular:     Rate and Rhythm: Normal rate.  Pulmonary:     Effort: Pulmonary effort is normal.  Musculoskeletal:     Cervical back: Neck supple.  Skin:    General: Skin is warm and dry.     Capillary Refill: Capillary refill takes less than 2 seconds.   Neurological:     Mental Status: She is alert. Mental status is at baseline.  Psychiatric:        Mood and Affect: Mood normal.        Behavior: Behavior normal.    Depression screen [facility] 2/9 05/04/2020 01/16/2020 08/21/2019  Decreased Interest 1 0 0  Down, Depressed, Hopeless 1 0 0  PHQ - 2 Score 2 0 0  Altered sleeping 2 - -  Tired, decreased energy 0 - -  Change in appetite 0 - -  Feeling bad or failure about yourself  0 - -  Trouble concentrating 0 - -  Moving slowly or fidgety/restless 0 - -  Suicidal thoughts 0 - -  PHQ-9 Score 4 - -  Difficult doing work/chores - - -          Assessment & Plan:   Problem List Items Addressed This Visit      Digestive   Parotid nodule    Multiple noted on recent CT scan. No clear source or cause. Discussed ENT referral to consider additional work-up      Relevant Orders   Ambulatory referral  to ENT     Other   Hiatal hernia - Primary    S/p EGD waiting on pathology results. Continues to have reflux symptoms. Worse since the procedure. Advised she reach out to GI about recent medication change.       Reactive depression    Pt concerned about persistent symptoms and would like to try medication. Her PHQ-9 is only minimally elevated, but interrupting life. Will try lexapro 5 mg due to hx of side effects.       Relevant Medications   escitalopram (LEXAPRO) 5 MG tablet       Return in about 6 weeks (around 06/15/2020).  Lesleigh Noe, MD  This visit occurred during the SARS-CoV-2 public health emergency.  Safety protocols were in place, including screening questions prior to the visit, additional usage of staff PPE, and extensive cleaning of exam room while observing appropriate contact time as indicated for disinfecting solutions.

## 2020-05-04 NOTE — Telephone Encounter (Signed)
I would like to wait for her H. pylori IgG results  RV

## 2020-05-04 NOTE — Assessment & Plan Note (Signed)
Multiple noted on recent CT scan. No clear source or cause. Discussed ENT referral to consider additional work-up

## 2020-05-04 NOTE — Telephone Encounter (Signed)
-----   Message from Lin Landsman, MD sent at 05/04/2020  8:36 AM EDT ----- Gastric biopsy results shows active gastritis.  Therefore, recommend to check H. pylori IgG  Thanks RV

## 2020-05-04 NOTE — Telephone Encounter (Signed)
Patient states for a week she has been having acid reflex, heart burn and gas. She denies any abdominal pain. She states the night of the procedure she had a migraine like headache that was severe that lasted 15 to 20 minutes

## 2020-05-04 NOTE — Telephone Encounter (Signed)
Informed patient

## 2020-05-04 NOTE — Patient Instructions (Addendum)
#  Referral I have placed a referral to a specialist for you. You should receive a phone call from the specialty office. Make sure your voicemail is not full and that if you are able to answer your phone to unknown or new numbers.   It may take up to 2 weeks to hear about the referral. If you do not hear anything in 2 weeks, please call our office and ask to speak with the referral coordinator.     Ashwagandha - available as an herbal supplement  You are going to start a new antidepressant medication.   One of the risks of this medication is increase in suicidal thoughts.   Your suicide Action plan is as follows:  1) Call husband 2) Call the Suicide Hotline [phone] which is available 24 hours 3) Call the Clinic   The most common side effect is stomach upset. If this happens it means the medication is working. It should get better in 1-3 weeks.   Medication for depression and anxiety often takes 6-8 weeks to have a noticeable difference so stick with it. Also the best way for recovery is taking medication and seeing a therapist -- this is so important.      4) Find a therapist  -- [facility] is one option. Call [phone] -- Or you can check out www.psychologytoday.com -- you can read bios of therapists and see if they accept insurance -- Check with your insurance to see if you have coverage and who may take your insurance  [place] location:   [facility] [phone]-  -Counseling/therapy (14 years and up- Medicaid insurance only)   -Psychiatry services most insurances accepted-treat and evaluate/diagnose patients and prescribe medications.  -Medication Management 67 years old to 60 years.  -Diagnoses treated: Depression/anxiety, ADHD, Substance Abuse, Bipolar Disorder, Psychotherapy, Pain Management, Spiritual Care Services.  [facility] (68 years of age and older) [phone]- Psychology services/therapy only.  [facility] [phone]- All ages-Just Therapy services   [facility] [phone]- [place] [facility]-counseling only.   [place] [phone]- All ages-Psychology and Psychiatrist services (evaluate, treat, diagnose, prescribe medication) Treat all the diagnoses that would fall under Behavioral issues.   For new patients, on Monday, Wednesday and Friday from 8 am to 3 pm patient would just walk in and fill out paperwork and they would get patient enrolled in the services they need based on their answers.   [employer] [phone]- All ages. Monday through Friday-9am to 4 pm walk in times for patients to come in and be evaluated. Medication Management only at this time. No therapy available.   [place] location:   [facility] Address: [address] [place] [address] Phone: [phone] Counseling and psychiatry services.   [facility] [place] and [place] locations)- [phone]- all ages, only therapy/counseling services/psychological evaluations. Services: aptitude testing, academic achievement testing, learning Disability evaluation, ADHD evaluations, psycho-educational evaluations, readiness for kindergarten Evaluations.   [place] location only has therapy services right now  [facility] [phone] services only. Works off [address] in [place].

## 2020-05-05 LAB — H. PYLORI ANTIBODY, IGG: H. pylori, IgG AbS: 0.28 Index Value (ref 0.00–0.79)

## 2020-05-06 ENCOUNTER — Telehealth: Payer: Self-pay

## 2020-05-06 NOTE — Telephone Encounter (Signed)
Patient verbalized understanding of results  

## 2020-05-06 NOTE — Telephone Encounter (Signed)
-----   Message from Lin Landsman, MD sent at 05/06/2020  1:49 PM EDT ----- No evidence of infection in her stomach.  I recommend to continue omeprazole 40 mg twice daily.  Will follow-up on gastric emptying study results  RV

## 2020-05-25 ENCOUNTER — Other Ambulatory Visit: Payer: Self-pay

## 2020-05-25 ENCOUNTER — Ambulatory Visit: Payer: Medicare Other | Admitting: Obstetrics & Gynecology

## 2020-05-25 ENCOUNTER — Other Ambulatory Visit (HOSPITAL_COMMUNITY)
Admission: RE | Admit: 2020-05-25 | Discharge: 2020-05-25 | Disposition: A | Discharge status: Home / Self Care | Payer: Medicare Other | Source: Ambulatory Visit | Attending: Obstetrics & Gynecology | Admitting: Obstetrics & Gynecology

## 2020-05-25 ENCOUNTER — Encounter: Payer: Self-pay | Admitting: Obstetrics & Gynecology

## 2020-05-25 VITALS — BP 130/80 | Ht 68.0 in | Wt 232.0 lb

## 2020-05-25 DIAGNOSIS — Z01411 Encounter for gynecological examination (general) (routine) with abnormal findings: Secondary | ICD-10-CM | POA: Diagnosis present

## 2020-05-25 DIAGNOSIS — Z1151 Encounter for screening for human papillomavirus (HPV): Secondary | ICD-10-CM | POA: Insufficient documentation

## 2020-05-25 DIAGNOSIS — Z1231 Encounter for screening mammogram for malignant neoplasm of breast: Secondary | ICD-10-CM | POA: Diagnosis not present

## 2020-05-25 DIAGNOSIS — R21 Rash and other nonspecific skin eruption: Secondary | ICD-10-CM | POA: Diagnosis not present

## 2020-05-25 DIAGNOSIS — Z124 Encounter for screening for malignant neoplasm of cervix: Secondary | ICD-10-CM | POA: Diagnosis not present

## 2020-05-25 MED ORDER — CLOTRIMAZOLE-BETAMETHASONE 1-0.05 % EX CREA: 1.0000 "application " | TOPICAL_CREAM | Freq: Two times a day (BID) | CUTANEOUS | 0 refills | Status: DC

## 2020-05-25 NOTE — Progress Notes (Signed)
HPI:      [name] is a 65 y.o. G1P1 WF who presents today for a problem visit.  She complains of:  Vulvar concern:   This is a 65 y.o. old Caucasian/White female who presents for the evaluation of vulvar rash.  She first had a abdominal and thigh rash 5 days ago, fine pink mildly pruitic.  This fased in about 2 days but then started to have a vulvar cyst (hair follicle) for a day and then a more intense vulvar rash, similar in that it has been erythematous, itchy, mildly sore, raised, R>L.  No vag discharge or bleeding.  Pt recently changed meds (to Prilosec) which in reading may have interaction w Atorostatin.  She also exercises w some sweat perineal.  PMHx: She  has a past medical history of Depression, GERD (gastroesophageal reflux disease), Headache, Hiatal hernia, Hyperlipemia, Hypertension, Multiple gastric ulcers, Myocardial infarction [facility]), and STEMI (ST elevation myocardial infarction) ([place]) (04/10/2018). Also,  has a past surgical history that includes Colonoscopy w/ endoscopic [place] (10/17/2017); Cholecystectomy (2010); and Esophagogastroduodenoscopy (egd) with propofol (N/A, 04/28/2020)., family history includes Cancer in her sister; Cervical cancer in her sister; Dementia in her father and mother; Diabetes in her brother, father, mother, and sister; Heart disease in her father; Hyperlipidemia in her sister.,  reports that she has never smoked. She has never used smokeless tobacco. She reports that she does not drink alcohol and does not use drugs.  She has a current medication list which includes the following prescription(s): apixaban, atorvastatin, clotrimazole-betamethasone, escitalopram, furosemide, lisinopril, metoprolol succinate, nitroglycerin, and omeprazole. Also, is allergic to gluten meal and methylprednisolone.  Review of Systems  Constitutional: Negative for chills, fever and malaise/fatigue.  HENT: Negative for congestion, sinus pain and sore throat.   Eyes:  Negative for blurred vision and pain.  Respiratory: Negative for cough and wheezing.   Cardiovascular: Negative for chest pain and leg swelling.  Gastrointestinal: Negative for abdominal pain, constipation, diarrhea, heartburn, nausea and vomiting.  Genitourinary: Negative for dysuria, frequency, hematuria and urgency.  Musculoskeletal: Negative for back pain, joint pain, myalgias and neck pain.  Skin: Negative for itching and rash.  Neurological: Negative for dizziness, tremors and weakness.  Endo/Heme/Allergies: Does not bruise/bleed easily.  Psychiatric/Behavioral: Negative for depression. The patient is not nervous/anxious and does not have insomnia.     Objective: BP 130/80   Ht 5\' 8"  (1.727 m)   Wt 232 lb (105.2 kg)   BMI 35.28 kg/m  Physical Exam Constitutional:      General: She is not in acute distress.    Appearance: She is well-developed.  Genitourinary:     Bladder and urethral meatus normal.     Right Labia: rash.     Right Labia: No tenderness.    Left Labia: rash.     Left Labia: No tenderness.    Vulva exam comments: Erythematous slightly raised irregular diffuse pattern mostly on right vulva and mons No specific lesions, vesicles, warts, fissures.     No inguinal adenopathy present in the right or left side.    No vaginal erythema or bleeding.      Right Adnexa: not tender and no mass present.    Left Adnexa: not tender and no mass present.    No cervical motion tenderness, discharge, polyp or nabothian cyst.     Uterus is not enlarged.     No uterine mass detected.    Uterus is midaxial.     Pelvic exam was performed with  patient in the lithotomy position.  HENT:     Head: Normocephalic and atraumatic.     Nose: Nose normal.  Abdominal:     General: There is no distension.     Palpations: Abdomen is soft.     Tenderness: There is no abdominal tenderness.  Musculoskeletal:        General: Normal range of motion.  Lymphadenopathy:     Lower Body: No  right inguinal adenopathy. No left inguinal adenopathy.  Neurological:     Mental Status: She is alert and oriented to person, place, and time.     Cranial Nerves: No cranial nerve deficit.  Skin:    General: Skin is warm and dry.  Psychiatric:        Attention and Perception: Attention normal.        Mood and Affect: Mood and affect normal.        Speech: Speech normal.        Behavior: Behavior normal.        Thought Content: Thought content normal.        Judgment: Judgment normal.     ASSESSMENT/PLAN:    Problem List Items Addressed This Visit   Vulvar Rash    Likely yeast or heat, vs contact dermatitis related to Prilosec or other exposure    Plan Lotrisone therapy Pros and cons discussed.  Follow up based on results and reaction to therapy   Screening for cervical cancer       Relevant Orders   Cytology - PAP   Encounter for screening mammogram for malignant neoplasm of breast       Relevant Orders   MM 3D SCREEN BREAST BILATERAL      Barnett Applebaum, MD, [facility], [facility] 05/25/2020  2:56 PM

## 2020-05-25 NOTE — Patient Instructions (Signed)
PAP every three years    Done today Mammogram every year    Call [phone] to schedule at [facility] Colonoscopy every 10 years Labs yearly (with PCP)  Thank you for choosing [facility]. As part of our ongoing efforts to improve patient experience, we would appreciate your feedback. Please fill out the short survey that you will receive by mail or MyChart. Your opinion is important to [place]! - Dr. Kenton Kingfisher  Clotrimazole; Betamethasone Skin Cream What is this medicine? CLOTRIMAZOLE; BETAMETHASONE (kloe TRIM a zole; bay ta METH a sone) is a corticosteroid and antifungal cream. It treats ringworm and infections like jock itch and athlete's foot. It also helps reduce swelling, redness, and itching caused by these infections. This medicine may be used for other purposes; ask your health care provider or pharmacist if you have questions. COMMON BRAND NAME(S): Lotrisone What should I tell my health care provider before I take this medicine? They need to know if you have any of these conditions:  large areas of burned or damaged skin  skin thinning  peripheral vascular disease or poor circulation  an unusual or allergic reaction to clotrimazole, betamethasone, other medicines, foods, dyes, or preservatives  pregnant or trying to get pregnant  breast-feeding How should I use this medicine? This cream is for external use only. Do not take by mouth. Follow the directions on the prescription label. Wash your hands before and after use. If treating hand or nail infections, wash hands before use only. Apply a thin layer of cream to the affected area and rub in gently. Do not cover or wrap the treated area with an airtight bandage (like a plastic bandage). Use the cream for the full course of treatment prescribed, even if you think the condition is getting better. Use the medicine at regular intervals. Do not use more often than directed. Do not use on healthy skin or over large areas of skin. Do not  use this medicine for any condition other than the one for which it was prescribed. When applying to the groin area, apply a small amount and do not use for longer than 2 weeks unless directed to by your doctor or health care professional. Do not get this cream in your eyes. If you do, rinse out with plenty of cool tap water. Talk to your pediatrician regarding the use of this medicine in children. While this drug may be prescribed for children as young as 17 years for selected conditions, precautions do apply. Patients over 57 years old may have a stronger reaction and need a smaller dose. Overdosage: If you think you have taken too much of this medicine contact a poison control center or emergency room at once. NOTE: This medicine is only for you. Do not share this medicine with others. What if I miss a dose? If you miss a dose, use it as soon as you can. If it is almost time for your next dose, use only that dose. Do not use double or take extra doses. What may interact with this medicine?  topical products that have nystatin This list may not describe all possible interactions. Give your health care provider a list of all the medicines, herbs, non-prescription drugs, or dietary supplements you use. Also tell them if you smoke, drink alcohol, or use illegal drugs. Some items may interact with your medicine. What should I watch for while using this medicine? If using this medicine on your body or groin tell your doctor or health care professional if  your symptoms do not improve within 1 week. If using this medicine on your feet tell your doctor or health care professional if your symptoms do not improve within 2 weeks. Tell your doctor if your skin infection returns after you stop using this cream. If you are using this cream for 'jock itch' be sure to dry the groin completely after bathing. Do not wear underwear that is tight-fitting or made from synthetic fibers like rayon or nylon. Wear  loose-fitting, cotton underwear. If you are using this cream for athlete's foot be sure to dry your feet carefully after bathing, especially between the toes. Do not wear socks made from wool or synthetic materials like rayon or nylon. Wear clean cotton socks and change them at least once a day, change them more if your feet sweat a lot. Also, try to wear sandals or shoes that are well-ventilated. Do not use this cream to treat diaper rash. What side effects may I notice from receiving this medicine? Side effects that you should report to your doctor or health care professional as soon as possible:  allergic reactions like skin rash, itching or hives, swelling of the face, lips, or tongue  dark red spots on the skin  lack of healing of skin condition  loss of feeling on skin  painful, red, pus-filled blisters in hair follicles  skin infection  sores or blisters that do not heal properly  thinning of the skin or sunburn Side effects that usually do not require medical attention (report to your doctor or health care professional if they continue or are bothersome):  dry or peeling skin  minor skin irritation, burning, or itching This list may not describe all possible side effects. Call your doctor for medical advice about side effects. You may report side effects to FDA at 1-800-FDA-1088. Where should I keep my medicine? Keep out of the reach of children. Store at room temperature between 15 and 30 degrees C ( 59 and 86 degrees F). Do not freeze. Throw away any unused medicine after the expiration date. NOTE: This sheet is a summary. It may not cover all possible information. If you have questions about this medicine, talk to your doctor, pharmacist, or health care provider.  2021 Elsevier/Gold Standard (2018-11-20 14:55:22)

## 2020-05-27 ENCOUNTER — Telehealth: Payer: Self-pay | Admitting: Family Medicine

## 2020-05-27 ENCOUNTER — Telehealth: Payer: Self-pay | Admitting: Gastroenterology

## 2020-05-27 DIAGNOSIS — D37030 Neoplasm of uncertain behavior of the parotid salivary glands: Secondary | ICD-10-CM | POA: Diagnosis not present

## 2020-05-27 DIAGNOSIS — D110 Benign neoplasm of parotid gland: Secondary | ICD-10-CM | POA: Diagnosis not present

## 2020-05-27 LAB — CYTOLOGY - PAP
Comment: NEGATIVE
Diagnosis: NEGATIVE
High risk HPV: NEGATIVE

## 2020-05-27 NOTE — Telephone Encounter (Signed)
Patient states she went to the dermatologist for the rash and they think it is from medication and gave her a topical cream. She went to ent because her lymph nodes were swollen. They did some blood work for auto immune disease and it will wont be back till Friday. She wants to make sure it is okay to still have gastric emptying study. Informed patient it was okay to have done checked with ginger also

## 2020-05-27 NOTE — Telephone Encounter (Signed)
Patient would like call back to discuss rash that has developed due to medication.  She would also like to discuss tests that is coming up Friday 05/29/20.

## 2020-05-28 DIAGNOSIS — D110 Benign neoplasm of parotid gland: Secondary | ICD-10-CM | POA: Diagnosis not present

## 2020-05-29 ENCOUNTER — Encounter
Admission: RE | Admit: 2020-05-29 | Discharge: 2020-05-29 | Disposition: A | Discharge status: Home / Self Care | Payer: Medicare Other | Source: Ambulatory Visit | Attending: Gastroenterology | Admitting: Gastroenterology

## 2020-05-29 ENCOUNTER — Other Ambulatory Visit: Payer: Self-pay

## 2020-05-29 DIAGNOSIS — R1010 Upper abdominal pain, unspecified: Secondary | ICD-10-CM | POA: Insufficient documentation

## 2020-05-29 DIAGNOSIS — G8929 Other chronic pain: Secondary | ICD-10-CM | POA: Diagnosis not present

## 2020-05-29 DIAGNOSIS — R1013 Epigastric pain: Secondary | ICD-10-CM | POA: Diagnosis not present

## 2020-05-29 MED ORDER — TECHNETIUM TC 99M SULFUR COLLOID
1.8100 | Freq: Once | INTRAVENOUS | Status: AC | PRN
  Administered 2020-05-29: 1.81 via INTRAVENOUS

## 2020-06-02 ENCOUNTER — Telehealth: Payer: Self-pay

## 2020-06-02 NOTE — Telephone Encounter (Signed)
-----   Message from Lin Landsman, MD sent at 06/02/2020  9:22 AM EDT ----- Gastric emptying study results came back normal Will see her for follow-up as scheduled  RV

## 2020-06-02 NOTE — Telephone Encounter (Signed)
Patient verbalized understanding of results  

## 2020-06-03 ENCOUNTER — Encounter: Payer: Self-pay | Admitting: Obstetrics and Gynecology

## 2020-06-03 ENCOUNTER — Other Ambulatory Visit: Payer: Self-pay

## 2020-06-03 ENCOUNTER — Ambulatory Visit: Payer: Medicare Other | Admitting: Obstetrics and Gynecology

## 2020-06-03 VITALS — BP 128/80 | Ht 68.0 in | Wt 228.0 lb

## 2020-06-03 DIAGNOSIS — L290 Pruritus ani: Secondary | ICD-10-CM

## 2020-06-03 DIAGNOSIS — N898 Other specified noninflammatory disorders of vagina: Secondary | ICD-10-CM

## 2020-06-03 MED ORDER — VALACYCLOVIR HCL 1 G PO TABS
1000.0000 mg | ORAL_TABLET | Freq: Two times a day (BID) | ORAL | 0 refills | Status: AC
Start: 2020-06-03 — End: 2020-06-13

## 2020-06-03 NOTE — Progress Notes (Signed)
Joanne Noe, MD   Chief Complaint  Patient presents with  . Follow-up    Very little improvement, a lot of irritation    HPI:      [name] is a 65 y.o. No obstetric history on file. whose LMP was No LMP recorded. Patient is postmenopausal., presents today for painful area vaginally. Started with an itchy and painful rash in vulvar area a couple wks ago, saw Dr. Kenton Kingfisher. Looked fungal and Rxd lotrisone crm. This is helping but it has now spread to RT labia which is very painful, hurts to sleep and sit. Has some itching in perianal area. BM and urination increase sx. No new soaps/detergents. Is trying to keep areas dry. No fevers/flu like sx. No hx of cold sore on lips, no hx of genital HSV. Only had 1 sex partner.  Pt is awaiting bx on recently found parotid gland nodules, identified on CT scan after fall. Pt was told she is immunocompromised.    Past Medical History:  Diagnosis Date  . Depression   . GERD (gastroesophageal reflux disease)   . Headache   . Hiatal hernia   . Hyperlipemia   . Hypertension   . Multiple gastric ulcers   . Myocardial infarction ([place])   . STEMI (ST elevation myocardial infarction) ([place]) 04/10/2018    Past Surgical History:  Procedure Laterality Date  . CHOLECYSTECTOMY  2010  . COLONOSCOPY W/ ENDOSCOPIC [place]  10/17/2017  . ESOPHAGOGASTRODUODENOSCOPY (EGD) WITH PROPOFOL N/A 04/28/2020   Procedure: ESOPHAGOGASTRODUODENOSCOPY (EGD) WITH PROPOFOL;  Surgeon: Lin Landsman, MD;  Location: [facility];  Service: Gastroenterology;  Laterality: N/A;    Family History  Problem Relation Age of Onset  . Dementia Mother   . Diabetes Mother   . Dementia Father   . Diabetes Father   . Heart disease Father   . Cancer Sister        unknown type  . Diabetes Sister   . Hyperlipidemia Sister   . Cervical cancer Sister   . Diabetes Brother     Social History   Socioeconomic History  . Marital status: Married    Spouse name: [name]   . Number of children: 1  . Years of education: some college  . Highest education level: Not on file  Occupational History  . Not on file  Tobacco Use  . Smoking status: Never Smoker  . Smokeless tobacco: Never Used  Vaping Use  . Vaping Use: Never used  Substance and Sexual Activity  . Alcohol use: No  . Drug use: No  . Sexual activity: Not Currently  Other Topics Concern  . Not on file  Social History Narrative   01/16/20   From: [place] originally, moved 2021 to be near daughter   Living: with husband, [name] [phone])   Work: retired Scientist, research (physical sciences)      Family: daughter [name]      Enjoys: reading, games, walking the park       Exercise: walking 2-3 times a week   Diet: tires to do heart healthy diet      Safety   Seat belts: Yes    Guns: Yes  and secure   Safe in relationships: Yes    Social Determinants of Health   Financial Resource Strain: Low Risk   . Difficulty of Paying Living Expenses: Not hard at all  Food Insecurity: No Food Insecurity  . Worried About Charity fundraiser in the Last Year: Never true  .  Ran Out of Food in the Last Year: Never true  Transportation Needs: No Transportation Needs  . Lack of Transportation (Medical): No  . Lack of Transportation (Non-Medical): No  Physical Activity: Inactive  . Days of Exercise per Week: 0 days  . Minutes of Exercise per Session: 0 min  Stress: No Stress Concern Present  . Feeling of Stress : Not at all  Social Connections: Unknown  . Frequency of Communication with Friends and Family: Not on file  . Frequency of Social Gatherings with Friends and Family: Not on file  . Attends Religious Services: Not on file  . Active Member of Clubs or Organizations: Not on file  . Attends Archivist Meetings: Not on file  . Marital Status: Married  Human resources officer Violence: Not At Risk  . Fear of Current or Ex-Partner: No  . Emotionally Abused: No  . Physically Abused: No  . Sexually Abused: No     Outpatient Medications Prior to Visit  Medication Sig Dispense Refill  . apixaban (ELIQUIS) 5 MG TABS tablet Take 1 tablet by mouth 2 (two) times daily. cardio    . atorvastatin (LIPITOR) 80 MG tablet Take 1 tablet by mouth daily. Dr Claud Kelp [facility] cardio    . clotrimazole-betamethasone (LOTRISONE) cream Apply 1 application topically 2 (two) times daily. 30 g 0  . escitalopram (LEXAPRO) 5 MG tablet Take 1 tablet (5 mg total) by mouth daily. 30 tablet 1  . furosemide (LASIX) 20 MG tablet Take 20 mg by mouth as needed. cardio    . lisinopril (ZESTRIL) 5 MG tablet Take 1 tablet by mouth daily. cardio    . metoprolol succinate (TOPROL-XL) 25 MG 24 hr tablet Take 12.5 mg by mouth daily. cardio    . nitroGLYCERIN (NITROSTAT) 0.4 MG SL tablet Place under the tongue. cardio    . omeprazole (PRILOSEC) 40 MG capsule Take 1 capsule (40 mg total) by mouth 2 (two) times daily before a meal. 180 capsule 0   No facility-administered medications prior to visit.      ROS:  Review of Systems  Constitutional: Negative for fever.  Gastrointestinal: Negative for blood in stool, constipation, diarrhea, nausea and vomiting.  Genitourinary: Positive for genital sores and vaginal pain. Negative for dyspareunia, dysuria, flank pain, frequency, hematuria, urgency, vaginal bleeding and vaginal discharge.  Musculoskeletal: Negative for back pain.  Skin: Negative for rash.    OBJECTIVE:   Vitals:  BP 128/80   Ht 5\' 8"  (1.727 m)   Wt 228 lb (103.4 kg)   BMI 34.67 kg/m   Physical Exam Constitutional:      Appearance: Normal appearance.  Pulmonary:     Effort: Pulmonary effort is normal.  Genitourinary:    Labia:        Right: Tenderness and lesion present.        Left: No tenderness or lesion.        Comments: AREAS OF PRIOR FUNGAL DERMATITIS RESOLVING Musculoskeletal:        General: Normal range of motion.  Neurological:     Mental Status: She is alert and oriented to person, place, and time.   Psychiatric:        Judgment: Judgment normal.     Assessment/Plan: Vaginal lesion - Plan: valACYclovir (VALTREX) 1000 MG tablet, HSV NAA; new lesion RT labia minora, looks c/w HSV. Check culture. Rx valtrex, sitz baths, has lidocaine oint from prior injury, tylenol prn pain. Will f/u with results. If neg, will check HSV 2 IgG.  Perianal itch--looks fungal. Can treat with remaining lotrisone crm. Reasurance. F/u prn.    Meds ordered this encounter  Medications  . valACYclovir (VALTREX) 1000 MG tablet    Sig: Take 1 tablet (1,000 mg total) by mouth 2 (two) times daily for 10 days.    Dispense:  20 tablet    Refill:  0    Order Specific Question:   Supervising Provider    Answer:   Gae Dry [[id number]]      Return if symptoms worsen or fail to improve.  Vineeth Fell B. Khye Hochstetler, PA-C 06/03/2020 11:12 AM

## 2020-06-03 NOTE — Patient Instructions (Signed)
I value your feedback and you entrusting us with your care. If you get a [facility] patient survey, I would appreciate you taking the time to let us know about your experience today. Thank you! ? ? ?

## 2020-06-04 ENCOUNTER — Other Ambulatory Visit: Payer: Self-pay | Admitting: Otolaryngology

## 2020-06-04 DIAGNOSIS — D110 Benign neoplasm of parotid gland: Secondary | ICD-10-CM

## 2020-06-04 NOTE — Telephone Encounter (Signed)
error 

## 2020-06-07 LAB — HSV NAA
HSV 1 NAA: NEGATIVE
HSV 2 NAA: NEGATIVE

## 2020-06-08 ENCOUNTER — Telehealth: Payer: Self-pay | Admitting: Obstetrics and Gynecology

## 2020-06-08 DIAGNOSIS — N898 Other specified noninflammatory disorders of vagina: Secondary | ICD-10-CM

## 2020-06-08 NOTE — Telephone Encounter (Signed)
Pt aware of neg HSV culture for vaginal lesion. Will check HSV 2 IgG and f/u with results. 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 Lesion is improving per pt report.

## 2020-06-09 ENCOUNTER — Other Ambulatory Visit: Payer: Medicare Other

## 2020-06-09 ENCOUNTER — Other Ambulatory Visit: Payer: Self-pay

## 2020-06-09 ENCOUNTER — Telehealth: Payer: Self-pay

## 2020-06-09 ENCOUNTER — Other Ambulatory Visit: Payer: Self-pay | Admitting: Obstetrics and Gynecology

## 2020-06-09 DIAGNOSIS — N898 Other specified noninflammatory disorders of vagina: Secondary | ICD-10-CM

## 2020-06-09 MED ORDER — CLOTRIMAZOLE-BETAMETHASONE 1-0.05 % EX CREA: TOPICAL_CREAM | CUTANEOUS | 0 refills | Status: DC

## 2020-06-09 NOTE — Telephone Encounter (Signed)
Where is her rash now? Was improving.

## 2020-06-09 NOTE — Telephone Encounter (Signed)
It was improving, but now it is moving more into the intimate area.

## 2020-06-09 NOTE — Telephone Encounter (Signed)
Rx RF eRxd.  

## 2020-06-09 NOTE — Progress Notes (Signed)
Rx RF lotrisone crm for tinea cruris.

## 2020-06-09 NOTE — Telephone Encounter (Signed)
Pt calling; still has rash; has ran out of sample of cream ABC gave her; would like rx of the cream; has appt today at 2:30 with the lab.  [phone]

## 2020-06-09 NOTE — Telephone Encounter (Signed)
Pt aware.

## 2020-06-10 LAB — HSV 2 ANTIBODY, IGG: HSV 2 IgG, Type Spec: <0.91 index (ref 0.00–0.90)

## 2020-06-17 ENCOUNTER — Other Ambulatory Visit: Payer: Self-pay

## 2020-06-17 ENCOUNTER — Ambulatory Visit (INDEPENDENT_AMBULATORY_CARE_PROVIDER_SITE_OTHER): Payer: Medicare Other | Admitting: Family Medicine

## 2020-06-17 VITALS — BP 102/60 | HR 71 | Temp 97.7°F | Ht 68.0 in | Wt 230.0 lb

## 2020-06-17 DIAGNOSIS — S86911A Strain of unspecified muscle(s) and tendon(s) at lower leg level, right leg, initial encounter: Secondary | ICD-10-CM | POA: Diagnosis not present

## 2020-06-17 DIAGNOSIS — F329 Major depressive disorder, single episode, unspecified: Secondary | ICD-10-CM | POA: Diagnosis not present

## 2020-06-17 DIAGNOSIS — K279 Peptic ulcer, site unspecified, unspecified as acute or chronic, without hemorrhage or perforation: Secondary | ICD-10-CM | POA: Diagnosis not present

## 2020-06-17 MED ORDER — PANTOPRAZOLE SODIUM 40 MG PO TBEC: 40.0000 mg | DELAYED_RELEASE_TABLET | Freq: Every day | ORAL | 1 refills | Status: DC

## 2020-06-17 MED ORDER — CYCLOBENZAPRINE HCL 5 MG PO TABS: 5.0000 mg | ORAL_TABLET | Freq: Three times a day (TID) | ORAL | 1 refills | Status: DC | PRN

## 2020-06-17 NOTE — Assessment & Plan Note (Signed)
Normal strength. Suspect strained muscle but unable to localize where as pain for full leg, though worse in the glute/piriformis area. Muscle relaxant. Given slow improvement advised PT if not making significant improvement in the next 1-2 weeks. Gentle LE stretching advised

## 2020-06-17 NOTE — Patient Instructions (Signed)
Try the flexeril - may make you sleepy  Ok to take tylenol 1000 mg twice daily  Would avoid Ibuprofen  Try to do gentle stretching -- calf, hamstring, inner thigh, quadricept, and piriformis (though wait until this is improving)  If no improvement with rest and muscle relaxant in the next week call and will place referral to physical therapy

## 2020-06-17 NOTE — Assessment & Plan Note (Signed)
Stable on pantoprazole 40 mg. Follows with Dr. Marius Ditch.

## 2020-06-17 NOTE — Progress Notes (Signed)
Subjective:     Joanne Clark is a 65 y.o. female presenting for Follow-up (6 wk)     HPI  #Depression/anxiety - doing ok overall - only started lexapro 1 week ago - daughter has PTSD and is in a facility in [place] and has been hectic at home - trying to care for 3 dogs now  #Vaginal rash - 95% gone - was seeing OB/GYN - treated for fungal/HSV  #Leg injury - leg slide to the side - whole leg is sore - severe pain at night - not able to sleep - has been 2-3 weeks  - treatment: tylenol 1000 mg  - heart doctor says to not take too much tylenol - improves with rest, sleeping at night - is able to walk - does get spasms which radiate to the back - area feels tight and sore behind the knee - sharp sensation in the foot/hip - sorest spot is behind the knee/hip   Review of Systems   Social History   Tobacco Use  Smoking Status Never Smoker  Smokeless Tobacco Never Used        Objective:    BP Readings from Last 3 Encounters:  06/17/20 102/60  06/03/20 128/80  05/25/20 130/80   Wt Readings from Last 3 Encounters:  06/17/20 230 lb (104.3 kg)  06/03/20 228 lb (103.4 kg)  05/25/20 232 lb (105.2 kg)    BP 102/60   Pulse 71   Temp 97.7 F (36.5 C) (Temporal)   Ht 5\' 8"  (1.727 m)   Wt 230 lb (104.3 kg)   SpO2 96%   BMI 34.97 kg/m    Physical Exam Constitutional:      General: She is not in acute distress.    Appearance: She is well-developed. She is not diaphoretic.  HENT:     Right Ear: External ear normal.     Left Ear: External ear normal.  Eyes:     Conjunctiva/sclera: Conjunctivae normal.  Cardiovascular:     Rate and Rhythm: Normal rate.  Pulmonary:     Effort: Pulmonary effort is normal.  Musculoskeletal:     Cervical back: Neck supple.     Comments: Right leg:  Normal strength: hip flexion, knee extension, flexion, dorsiflexion/extension Spasm in back/buttock with piriformis stretching on the right side ROM normal  Skin:     General: Skin is warm and dry.     Capillary Refill: Capillary refill takes less than 2 seconds.  Neurological:     Mental Status: She is alert. Mental status is at baseline.  Psychiatric:        Mood and Affect: Mood normal.        Behavior: Behavior normal.    Depression screen [facility] 2/9 06/17/2020 05/04/2020 01/16/2020 08/21/2019 06/18/2019  Decreased Interest (No Data) 1 0 0 0  Down, Depressed, Hopeless (No Data) 1 0 0 1  PHQ - 2 Score - 2 0 0 1  Altered sleeping 3 2 - - 0  Tired, decreased energy 1 0 - - 1  Change in appetite 1 0 - - 0  Feeling bad or failure about yourself  (No Data) 0 - - 0  Trouble concentrating (No Data) 0 - - 0  Moving slowly or fidgety/restless 0 0 - - 0  Suicidal thoughts 0 0 - - 0  PHQ-9 Score - 4 - - 2  Difficult doing work/chores - - - - Somewhat difficult         Assessment & Plan:  Problem List Items Addressed This Visit      Digestive   PUD (peptic ulcer disease)    Stable on pantoprazole 40 mg. Follows with Dr. Marius Ditch.       Relevant Medications   pantoprazole (PROTONIX) 40 MG tablet     Musculoskeletal and Integument   Muscle strain of right lower leg - Primary    Normal strength. Suspect strained muscle but unable to localize where as pain for full leg, though worse in the glute/piriformis area. Muscle relaxant. Given slow improvement advised PT if not making significant improvement in the next 1-2 weeks. Gentle LE stretching advised      Relevant Medications   cyclobenzaprine (FLEXERIL) 5 MG tablet     Other   Reactive depression    Only started lexapro last week. She will update in a few weeks if helping. Mild symptoms.           Return if symptoms worsen or fail to improve.  Lesleigh Noe, MD  This visit occurred during the SARS-CoV-2 public health emergency.  Safety protocols were in place, including screening questions prior to the visit, additional usage of staff PPE, and extensive cleaning of exam room while observing  appropriate contact time as indicated for disinfecting solutions.

## 2020-06-17 NOTE — Assessment & Plan Note (Signed)
Only started lexapro last week. She will update in a few weeks if helping. Mild symptoms.

## 2020-06-27 ENCOUNTER — Other Ambulatory Visit: Payer: Self-pay | Admitting: Gastroenterology

## 2020-07-01 ENCOUNTER — Encounter: Payer: Self-pay | Admitting: Gastroenterology

## 2020-07-01 ENCOUNTER — Ambulatory Visit: Payer: Medicare Other | Admitting: Gastroenterology

## 2020-07-01 ENCOUNTER — Other Ambulatory Visit: Payer: Self-pay

## 2020-07-01 VITALS — BP 122/66 | HR 75 | Temp 98.3°F | Ht 68.0 in | Wt 226.2 lb

## 2020-07-01 DIAGNOSIS — R1013 Epigastric pain: Secondary | ICD-10-CM | POA: Diagnosis not present

## 2020-07-01 MED ORDER — PANTOPRAZOLE SODIUM 20 MG PO TBEC: 20.0000 mg | DELAYED_RELEASE_TABLET | Freq: Every day | ORAL | 0 refills | Status: DC

## 2020-07-01 NOTE — Progress Notes (Signed)
[name], MD [address]  [place]  [place], [place] [address]  Main: [phone]  Fax: [phone]    Gastroenterology Consultation  Referring Provider:     Lesleigh Noe, MD Primary Care Physician:  Lesleigh Noe, MD Primary Gastroenterologist:  Dr. Kathaleen Grinder Reason for Consultation:     Chronic upper abdominal pain        HPI:   Joanne Clark is a 65 y.o. female referred from ER for consultation & management of chronic left lower back pain.  [place].  She reports 8 months history of progressively worsening left lower back pain.  The pain initially started as discomfort below the left rib cage then moving to left lower back.  This started 8 months ago in [place], and she acknowledges that she did work her up lifting heavy weights. She was seen by GI in [place], Dr. Kathaleen Grinder initially as she was concerned if she had a hernia that popped causing the pain.  She underwent CT abdomen and pelvis followed by EGD, colonoscopy and gastric emptying study which were all unremarkable.  She was found to have 5 cm hiatal hernia per the EGD report.  However CT abdomen pelvis reported small hiatal hernia.  Patient reports intermittent heartburn for which she is taking Protonix daily.  She denies any other GI symptoms.  She denies weight loss, loss of appetite  Over the course of several months, she has noticed worsening of pain particularly when getting into the car and after prolonged walking.  She had atleast two exacerbations requiring ER visits.  Most recently, she went to [facility] ER, underwent CT renal study protocol which was unremarkable.  Her labs were unremarkable patient underwent cholecystectomy 2 years ago for chronic right upper quadrant pain and she was told that she has severe biliary dyskinesia based on the HIDA scan.  During majority of encounter with her, patient was pointing to left lateral lower  back with her finger  Follow-up visit 03/31/2020 Patient is referred by Dr. Dahlia Byes to evaluate for upper endoscopy.  She has been experiencing chronic upper abdominal discomfort, across her upper abdomen and tenderness in the epigastric area.  This has been ongoing for at least 2 years.  Her worst pain is when she lays flat and she had episodes that disturbed her sleep due to severe pain.  She denies heartburn or regurgitation.  She is taking Protonix 40 mg once a day.  She underwent CT abdomen and pelvis which revealed hiatal hernia, she had history of cholecystectomy.  She also underwent esophagogram which also revealed transient small hiatal hernia only.  Patient feels miserable and she was upset due to degree of pain that she is going through.  She has stressful last 2 or 3 years in her life due to loss of multiple family members.  She has a very supportive husband.  She is trying to modify her diet to lose weight, tries to stay active.  She does not drink alcohol.  Patient underwent upper endoscopy in 2019 at outside facility, reported to have 5 cm hiatal hernia  Follow-up visit 07/01/2020 Patient is here for follow-up of chronic upper abdominal discomfort.  She underwent upper endoscopy which revealed food in her stomach.  Gastric biopsies revealed active gastritis.  H. pylori IgG was negative.  She underwent gastric emptying study which was normal.  Patient has noticed that small frequent meals as well  as avoiding red meat and processed foods, fatty foods are improving her symptoms.  She has significant stress in her life which is also adding to her symptoms.  She is currently taking Protonix 40 mg daily and would like to know if she can decrease to 20 mg daily.  NSAIDs: None  Antiplts/Anticoagulants/Anti thrombotics: None  GI Procedures:   05/29/2020 Normal gastric emptying study.  Upper endoscopy 04/28/2020 - Retained food in the duodenum. - A medium amount of food (residue) in the stomach. -  Erythematous mucosa in the antrum. Biopsied. - Normal gastric body and incisura. Biopsied. - Normal gastroesophageal junction and esophagus. - Esophagogastric landmarks identified.  DIAGNOSIS:  A. STOMACH ERYTHEMA; BIOPSIES:  - FOCAL MILD CHRONIC ACTIVE GASTRITIS.  - IMMUNOSTAIN FOR H. PYLORI IS NEGATIVE.  - REACTIVE EPITHELIAL CHANGES.  - NEGATIVE FOR INTESTINAL METAPLASIA, DYSPLASIA AND MALIGNANCY.   NM gastric emptying study 10/27/2017 On the first image, there is adequate radiotracer uptake within the stomach. There is no radiotracer within the distal esophagus to suggest gastroesophageal reflux.  On the 60 minute image, there is 78% radiotracer remaining within the stomach (upper limit of normal - 90%*).  On the 120 minute image, there is 34% radiotracer remaining within the stomach (upper limit of normal - 60%*).  On the 180 minute image, there is 6% radiotracer remaining within the stomach (upper limit of normal - 30%*).  On the 240 minute image, there is 5% radiotracer remaining within the stomach (upper limit of normal - 10%*).  **Normal limits of gastric retention data is from Am J Gastroenterology. 2007;102:1-11.   T1/2 time of 98 minutes is indicative of mildly delayed gastric emptying (upper    EGD 10/02/2017 Impression:          - Normal esophagus. Biopsied.                      - 5 cm hiatal hernia.                      - Normal stomach. Biopsied.                      - Normal examined duodenum. Biopsied. 1.  Duodenum, biopsy:      -Unremarkable duodenal tissue.      -No celiac disease, dysplasia or malignancy.  2.  Stomach, biopsy:      -Unremarkable fundic type gastric mucosa.      -No Helicobacter organisms on H/E stain.      -No dysplasia or malignancy.  3.  Esophagus, biopsy:      -Unremarkable squamous mucosa.      -No gastric mucosa.      -No eosinophilic esophagitis, dysplasia or malignancy.  Colonoscopy 10/30/2017 Findings:      The  perianal and digital rectal examinations were normal.      The entire examined colon appeared normal on direct and retroflexion       views.   Past Medical History:  Diagnosis Date   Depression    GERD (gastroesophageal reflux disease)    Headache    Hiatal hernia    Hyperlipemia    Hypertension    Multiple gastric ulcers    Myocardial infarction [facility])    STEMI (ST elevation myocardial infarction) ([place]) 04/10/2018    Past Surgical History:  Procedure Laterality Date   CHOLECYSTECTOMY  2010   COLONOSCOPY W/ ENDOSCOPIC [place]  10/17/2017   ESOPHAGOGASTRODUODENOSCOPY (EGD) WITH PROPOFOL N/A  04/28/2020   Procedure: ESOPHAGOGASTRODUODENOSCOPY (EGD) WITH PROPOFOL;  Surgeon: Lin Landsman, MD;  Location: [facility] ENDOSCOPY;  Service: Gastroenterology;  Laterality: N/A;    Current Outpatient Medications:    apixaban (ELIQUIS) 5 MG TABS tablet, Take 1 tablet by mouth 2 (two) times daily. cardio, Disp: , Rfl:    atorvastatin (LIPITOR) 80 MG tablet, Take 1 tablet by mouth daily. Dr Dorna Bloom cardio, Disp: , Rfl:    cyclobenzaprine (FLEXERIL) 5 MG tablet, Take 1 tablet (5 mg total) by mouth 3 (three) times daily as needed for muscle spasms., Disp: 30 tablet, Rfl: 1   escitalopram (LEXAPRO) 5 MG tablet, Take 1 tablet (5 mg total) by mouth daily., Disp: 30 tablet, Rfl: 1   furosemide (LASIX) 20 MG tablet, Take 20 mg by mouth as needed. cardio, Disp: , Rfl:    lisinopril (ZESTRIL) 5 MG tablet, Take 1 tablet by mouth daily. cardio, Disp: , Rfl:    metoprolol succinate (TOPROL-XL) 25 MG 24 hr tablet, Take 12.5 mg by mouth daily. cardio, Disp: , Rfl:    nitroGLYCERIN (NITROSTAT) 0.4 MG SL tablet, Place under the tongue. cardio, Disp: , Rfl:    pantoprazole (PROTONIX) 20 MG tablet, Take 1 tablet (20 mg total) by mouth daily., Disp: 90 tablet, Rfl: 0    Family History  Problem Relation Age of Onset   Dementia Mother    Diabetes Mother    Dementia Father    Diabetes Father    Heart disease  Father    Cancer Sister        unknown type   Diabetes Sister    Hyperlipidemia Sister    Cervical cancer Sister    Diabetes Brother      Social History   Tobacco Use   Smoking status: Never   Smokeless tobacco: Never  Vaping Use   Vaping Use: Never used  Substance Use Topics   Alcohol use: No   Drug use: No    Allergies as of 07/01/2020 - Review Complete 07/01/2020  Allergen Reaction Noted   Gluten meal  08/17/2006   Methylprednisolone Other (See Comments) 12/23/2017    Review of Systems:    All systems reviewed and negative except where noted in HPI.   Physical Exam:  BP 122/66 (BP Location: Left Arm, Patient Position: Sitting, Cuff Size: Normal)   Pulse 75   Temp 98.3 F (36.8 C) (Oral)   Ht 5\' 8"  (1.727 m)   Wt 226 lb 4 oz (102.6 kg)   BMI 34.40 kg/m  No LMP recorded. Patient is postmenopausal.  General:   Alert,  Well-developed, well-nourished, pleasant and cooperative in NAD Head:  Normocephalic and atraumatic. Eyes:  Sclera clear, no icterus.   Conjunctiva pink. Ears:  Normal auditory acuity. Nose:  No deformity, discharge, or lesions. Mouth:  No deformity or lesions,oropharynx pink & moist. Neck:  Supple; no masses or thyromegaly. Lungs:  Respirations even and unlabored.  Clear throughout to auscultation.   No wheezes, crackles, or rhonchi. No acute distress. Heart:  Regular rate and rhythm; no murmurs, clicks, rubs, or gallops. Abdomen:  Normal bowel sounds. Soft, nontender and non-distended without masses, hepatosplenomegaly or hernias noted.  No guarding or rebound tenderness.   Rectal: Not performed Msk:  Symmetrical without gross deformities. Good, equal movement & strength bilaterally. Pulses:  Normal pulses noted. Extremities:  No clubbing or edema.  No cyanosis. Neurologic:  Alert and oriented x3;  grossly normal neurologically. Skin:  Intact without significant lesions or rashes. No jaundice.  Psych:  Alert and cooperative. Normal mood and  affect.  Imaging Studies: Reviewed  Assessment and Plan:   Joanne Clark is a 65 y.o. Caucasian female with history of A. fib on Eliquis, obesity is seen in consultation for chronic upper abdominal pain, predominantly in the epigastric region.  CT abdomen pelvis revealed normal pancreas, no acute intra-abdominal pathology.  S/p cholecystectomy.  Upper endoscopy with gastric biopsies revealed active gastritis, H. pylori IgG negative, no evidence of H. pylori on biopsies.  She only has small hiatal hernia and therefore, I do not recommend hiatal hernia repair.  Okay to decrease Protonix to 20 mg daily as patient desires.  Discussed about healthy diet, dietary modification that are most likely contributing to her symptoms of dyspepsia.  Also, I have advised her to maintain food diary, try lactose-free diet, avoid red meat, processed foods, fatty foods.  I do not recommend further work-up at this time.  If she continues to have abdominal bloating with upper abdominal discomfort, can check pancreatic fecal elastase levels and empirically try pancreatic enzymes   Follow up as needed   [name], MD

## 2020-07-22 ENCOUNTER — Telehealth: Payer: Self-pay

## 2020-07-22 DIAGNOSIS — F329 Major depressive disorder, single episode, unspecified: Secondary | ICD-10-CM

## 2020-07-22 MED ORDER — ESCITALOPRAM OXALATE 5 MG PO TABS: 5.0000 mg | ORAL_TABLET | Freq: Every day | ORAL | 3 refills | Status: DC

## 2020-07-22 NOTE — Telephone Encounter (Signed)
Patient called to give update on how she is doing with Lexapro. Patient states she is doing very well on this medication and the dose. She does not want to increase the dose and just needs it refilled to [facility] if that is ok. Thank you

## 2020-07-22 NOTE — Telephone Encounter (Signed)
Refill sent to pharmacy.   

## 2020-07-24 ENCOUNTER — Other Ambulatory Visit: Payer: Self-pay | Admitting: Gastroenterology

## 2020-08-24 ENCOUNTER — Ambulatory Visit: Payer: Medicare Other

## 2020-09-24 ENCOUNTER — Telehealth: Payer: Self-pay

## 2020-09-24 NOTE — Telephone Encounter (Signed)
Left message to advise pt to schedule her mammogram left number to [facility]

## 2020-09-24 NOTE — Telephone Encounter (Signed)
-----   Message from Gae Dry, MD sent at 08/20/2020 10:05 AM EDT ----- Regarding: MMG Received notice she has not received MMG yet as ordered at her Annual. Please check and encourage her to do this, and document conversation.

## 2020-10-21 ENCOUNTER — Ambulatory Visit
Admission: RE | Admit: 2020-10-21 | Discharge: 2020-10-21 | Disposition: A | Discharge status: Home / Self Care | Payer: Medicare Other | Source: Ambulatory Visit | Attending: Obstetrics & Gynecology | Admitting: Obstetrics & Gynecology

## 2020-10-21 ENCOUNTER — Other Ambulatory Visit: Payer: Self-pay

## 2020-10-21 DIAGNOSIS — Z1231 Encounter for screening mammogram for malignant neoplasm of breast: Secondary | ICD-10-CM | POA: Diagnosis not present

## 2020-10-22 ENCOUNTER — Ambulatory Visit (INDEPENDENT_AMBULATORY_CARE_PROVIDER_SITE_OTHER): Payer: Medicare Other | Admitting: Family Medicine

## 2020-10-22 ENCOUNTER — Other Ambulatory Visit: Payer: Self-pay

## 2020-10-22 ENCOUNTER — Ambulatory Visit (INDEPENDENT_AMBULATORY_CARE_PROVIDER_SITE_OTHER): Payer: Medicare Other

## 2020-10-22 VITALS — BP 118/78 | HR 71 | Temp 97.0°F | Ht 68.0 in | Wt 223.5 lb

## 2020-10-22 DIAGNOSIS — M7989 Other specified soft tissue disorders: Secondary | ICD-10-CM | POA: Diagnosis not present

## 2020-10-22 DIAGNOSIS — M25471 Effusion, right ankle: Secondary | ICD-10-CM | POA: Insufficient documentation

## 2020-10-22 NOTE — Patient Instructions (Signed)
Pain - ankle brace as often as able - weight bearing as tolerated - Topical Voltaren Gel - suspect ankle sprain   - Update in 2 weeks if not improving - if improving can start exercises below   Ankle Sprain, Phase I Rehab An ankle sprain is an injury to the ligaments of your ankle. Ankle sprains cause stiffness, loss of motion, and loss of strength. Ask your health care provider which exercises are safe for you. Do exercises exactly as told by your health care provider and adjust them as directed. It is normal to feel mild stretching, pulling, tightness, or discomfort as you do these exercises. Stop right away if you feel sudden pain or your pain gets worse. Do not begin these exercises until told by your health care provider. Stretching and range-of-motion exercises These exercises warm up your muscles and joints and improve the movement and flexibility of your lower leg and ankle. These exercises also help to relieve pain and stiffness. Gastroc and soleus stretch This exercise is also called a calf stretch. It stretches the muscles in the back of the lower leg. These muscles are the gastrocnemius, or gastroc, and the soleus. Sit on the floor with your left / right leg extended. Loop a belt or towel around the ball of your left / right foot. The ball of your foot is on the walking surface, right under your toes. Keep your left / right ankle and foot relaxed and keep your knee straight while you use the belt or towel to pull your foot toward you. You should feel a gentle stretch behind your calf or knee in your gastroc muscle. Hold this position for __________ seconds, then release to the starting position. Repeat the exercise with your knee bent. You can put a pillow or a rolled bath towel under your knee to support it. You should feel a stretch deep in your calf in the soleus muscle or at your Achilles tendon. Repeat __________ times. Complete this exercise __________ times a day. Ankle  alphabet  Sit with your left / right leg supported at the lower leg. Do not rest your foot on anything. Make sure your foot has room to move freely. Think of your left / right foot as a paintbrush. Move your foot to trace each letter of the alphabet in the air. Keep your hip and knee still while you trace. Make the letters as large as you can without feeling discomfort. Trace every letter from A to Z. Repeat __________ times. Complete this exercise __________ times a day. Strengthening exercises These exercises build strength and endurance in your ankle and lower leg. Endurance is the ability to use your muscles for a long time, even after they get tired. Ankle dorsiflexion  Secure a rubber exercise band or tube to an object, such as a table leg, that will stay still when the band is pulled. Secure the other end around your left / right foot. Sit on the floor facing the object, with your left / right leg extended. The band or tube should be slightly tense when your foot is relaxed. Slowly bring your foot toward you, bringing the top of your foot toward your shin (dorsiflexion), and pulling the band tighter. Hold this position for __________ seconds. Slowly return your foot to the starting position. Repeat __________ times. Complete this exercise __________ times a day. Ankle plantar flexion  Sit on the floor with your left / right leg extended. Loop a rubber exercise tube or band around  the ball of your left / right foot. The ball of your foot is on the walking surface, right under your toes. Hold the ends of the band or tube in your hands. The band or tube should be slightly tense when your foot is relaxed. Slowly point your foot and toes downward to tilt the top of your foot away from your shin (plantar flexion). Hold this position for __________ seconds. Slowly return your foot to the starting position. Repeat __________ times. Complete this exercise __________ times a day. Ankle  eversion Sit on the floor with your legs straight out in front of you. Loop a rubber exercise band or tube around the ball of your left / right foot. The ball of your foot is on the walking surface, right under your toes. Hold the ends of the band in your hands, or secure the band to a stable object. The band or tube should be slightly tense when your foot is relaxed. Slowly push your foot outward, away from your other leg (eversion). Hold this position for __________ seconds. Slowly return your foot to the starting position. Repeat __________ times. Complete this exercise __________ times a day. This information is not intended to replace advice given to you by your health care provider. Make sure you discuss any questions you have with your health care provider. Document Revised: 02/27/2020 Document Reviewed: 02/27/2020 Elsevier Patient Education  2022 [employer].

## 2020-10-22 NOTE — Progress Notes (Signed)
Subjective:     Joanne Clark is a 65 y.o. female presenting for Joint Swelling (R ankle. Hurts to touch and walk, as well as has a "vibration" in it x 3-4 weeks )     Ankle Pain  There was no injury mechanism. The pain is present in the right ankle (radiates to knee). The quality of the pain is described as aching. The pain is moderate. The pain has been Constant since onset. Associated symptoms include a loss of motion. Pertinent negatives include no numbness or tingling. Associated symptoms comments: Vibration sensation. The symptoms are aggravated by movement, palpation and weight bearing. She has tried elevation and acetaminophen (lidocaine patch) for the symptoms. The treatment provided mild relief.    Review of Systems  Neurological:  Negative for tingling and numbness.    Social History   Tobacco Use  Smoking Status Never  Smokeless Tobacco Never        Objective:    BP Readings from Last 3 Encounters:  10/22/20 118/78  07/01/20 122/66  06/17/20 102/60   Wt Readings from Last 3 Encounters:  10/22/20 223 lb 8 oz (101.4 kg)  07/01/20 226 lb 4 oz (102.6 kg)  06/17/20 230 lb (104.3 kg)    BP 118/78   Pulse 71   Temp (!) 97 F (36.1 C) (Temporal)   Ht 5\' 8"  (1.727 m)   Wt 223 lb 8 oz (101.4 kg)   SpO2 98%   BMI 33.98 kg/m    Physical Exam Constitutional:      General: She is not in acute distress.    Appearance: She is well-developed. She is not diaphoretic.  HENT:     Right Ear: External ear normal.     Left Ear: External ear normal.     Nose: Nose normal.  Eyes:     Conjunctiva/sclera: Conjunctivae normal.  Cardiovascular:     Rate and Rhythm: Normal rate.  Pulmonary:     Effort: Pulmonary effort is normal.  Musculoskeletal:     Cervical back: Neck supple.     Comments: Right foot Inspection: swelling on the lateral ankle and the top of the foot, no erythema Palpation: TTP along the medial and lateral malleolus, and the 3rd metatarsal  area, and the heel ROM: normal though pt notes pain with dorsiflexion Strength: normal, though pain with dorsiflexion   Skin:    General: Skin is warm and dry.     Capillary Refill: Capillary refill takes less than 2 seconds.  Neurological:     Mental Status: She is alert. Mental status is at baseline.  Psychiatric:        Mood and Affect: Mood normal.        Behavior: Behavior normal.    XR Right ankle: No acute fracture on my read      Assessment & Plan:   Problem List Items Addressed This Visit       Other   Ankle swelling, right - Primary    XR w/o fracture but will f/u final read. Suspect sprain given swelling and pain. Advised brace, weight bearing as tolerated and voltaren gel for pain. If not improving with bracing advised either sports medicine or PT referral      Relevant Orders   DG Ankle Complete Right     Return if symptoms worsen or fail to improve.  Lesleigh Noe, MD  This visit occurred during the SARS-CoV-2 public health emergency.  Safety protocols were in place, including screening questions prior  to the visit, additional usage of staff PPE, and extensive cleaning of exam room while observing appropriate contact time as indicated for disinfecting solutions.

## 2020-10-22 NOTE — Assessment & Plan Note (Signed)
XR w/o fracture but will f/u final read. Suspect sprain given swelling and pain. Advised brace, weight bearing as tolerated and voltaren gel for pain. If not improving with bracing advised either sports medicine or PT referral

## 2020-10-29 ENCOUNTER — Encounter: Payer: Self-pay | Admitting: Obstetrics & Gynecology

## 2020-10-29 NOTE — Progress Notes (Signed)
[employer]

## 2020-12-06 ENCOUNTER — Other Ambulatory Visit: Payer: Self-pay | Admitting: Primary Care

## 2020-12-06 MED ORDER — PANTOPRAZOLE SODIUM 20 MG PO TBEC: 20.0000 mg | DELAYED_RELEASE_TABLET | Freq: Every day | ORAL | 0 refills | Status: DC

## 2020-12-06 MED ORDER — ATORVASTATIN CALCIUM 80 MG PO TABS: 80.0000 mg | ORAL_TABLET | Freq: Every day | ORAL | 0 refills | Status: DC

## 2020-12-14 ENCOUNTER — Other Ambulatory Visit: Payer: Self-pay

## 2020-12-14 ENCOUNTER — Ambulatory Visit
Admission: RE | Admit: 2020-12-14 | Discharge: 2020-12-14 | Disposition: A | Discharge status: Home / Self Care | Payer: Medicare Other | Source: Ambulatory Visit | Attending: Otolaryngology | Admitting: Otolaryngology

## 2020-12-14 DIAGNOSIS — M47812 Spondylosis without myelopathy or radiculopathy, cervical region: Secondary | ICD-10-CM | POA: Diagnosis not present

## 2020-12-14 DIAGNOSIS — D110 Benign neoplasm of parotid gland: Secondary | ICD-10-CM | POA: Insufficient documentation

## 2020-12-14 DIAGNOSIS — K056 Periodontal disease, unspecified: Secondary | ICD-10-CM | POA: Diagnosis not present

## 2020-12-14 DIAGNOSIS — I6523 Occlusion and stenosis of bilateral carotid arteries: Secondary | ICD-10-CM | POA: Diagnosis not present

## 2020-12-16 ENCOUNTER — Encounter: Payer: Self-pay | Admitting: Family Medicine

## 2020-12-16 NOTE — Telephone Encounter (Signed)
Patient needs to contact ordering provider's office for results.   Also, I am happy to meet with her in an office visit to discuss parts of the scan. Looks like she does need follow up CT scan of the lungs, not sure if Dr. Pryor Ochoa is handling or not.

## 2021-01-22 ENCOUNTER — Encounter: Payer: Self-pay | Admitting: Emergency Medicine

## 2021-01-22 ENCOUNTER — Other Ambulatory Visit: Payer: Self-pay

## 2021-01-22 ENCOUNTER — Ambulatory Visit
Admission: EM | Admit: 2021-01-22 | Discharge: 2021-01-22 | Disposition: A | Discharge status: Home / Self Care | Payer: Medicare Other | Attending: Physician Assistant | Admitting: Physician Assistant

## 2021-01-22 ENCOUNTER — Ambulatory Visit (INDEPENDENT_AMBULATORY_CARE_PROVIDER_SITE_OTHER): Payer: Medicare Other

## 2021-01-22 DIAGNOSIS — M25461 Effusion, right knee: Secondary | ICD-10-CM

## 2021-01-22 DIAGNOSIS — M25561 Pain in right knee: Secondary | ICD-10-CM

## 2021-01-22 DIAGNOSIS — Z043 Encounter for examination and observation following other accident: Secondary | ICD-10-CM | POA: Diagnosis not present

## 2021-01-22 MED ORDER — TRAMADOL HCL 50 MG PO TABS: 50.0000 mg | ORAL_TABLET | Freq: Two times a day (BID) | ORAL | 0 refills | Status: AC | PRN

## 2021-01-22 NOTE — ED Provider Notes (Signed)
[facility]    CSN: [id number] Arrival date & time: 01/22/21  1654      History   Chief Complaint Chief Complaint  Patient presents with   Knee Pain    right    HPI Joanne Clark is a 66 y.o. female presenting for right knee pain and swelling for the past 3 hours.  Patient says that she has 2 new puppies and accidentally fell outside while she was walking with the dogs.  Reports that she somehow twisted the knee and then landed on it.  Has a scrape on her knee.  Reports she was wearing jeans.  Patient says she is able to bear weight on the leg but it is incredibly painful so she has been using her husband's cane when walking.  Has not taken anything for pain relief or applied ice to the knee.  Denies any major injury of this knee and has never had any surgeries on the knee.  Past medical history significant for MI in 2020, hypertension, hyperlipidemia, gastric ulcers, GERD, and atrial fibrillation currently on anticoagulation with Eliquis.  Patient does not report any other injuries.  Does not report head injury or LOC.  No other complaints.  HPI  Past Medical History:  Diagnosis Date   Depression    GERD (gastroesophageal reflux disease)    Headache    Hiatal hernia    Hyperlipemia    Hypertension    Multiple gastric ulcers    Myocardial infarction [facility])    STEMI (ST elevation myocardial infarction) ([place]) 04/10/2018    Patient Active Problem List   Diagnosis Date Noted   Ankle swelling, right 10/22/2020   Muscle strain of right lower leg 06/17/2020   Parotid nodule 05/04/2020   Reactive depression 05/04/2020   Pain of upper abdomen    Stress and adjustment reaction 01/16/2020   Mixed hyperlipidemia 02/14/2019   Atrial fibrillation ([place]) 05/20/2018   Hiatal hernia 05/20/2018   Migraine headache 05/20/2018   Chronic combined systolic and diastolic heart failure ([place]) 05/20/2018   Presence of stent in coronary artery 05/20/2018   PUD (peptic ulcer disease)  05/20/2018   History of ST elevation myocardial infarction (STEMI) 04/10/2018   History of cholecystectomy 01/28/2010    Past Surgical History:  Procedure Laterality Date   CHOLECYSTECTOMY  2010   COLONOSCOPY W/ ENDOSCOPIC [place]  10/17/2017   ESOPHAGOGASTRODUODENOSCOPY (EGD) WITH PROPOFOL N/A 04/28/2020   Procedure: ESOPHAGOGASTRODUODENOSCOPY (EGD) WITH PROPOFOL;  Surgeon: Lin Landsman, MD;  Location: [place];  Service: Gastroenterology;  Laterality: N/A;    OB History   No obstetric history on file.      Home Medications    Prior to Admission medications   Medication Sig Start Date End Date Taking? Authorizing Provider  apixaban (ELIQUIS) 5 MG TABS tablet Take 1 tablet by mouth 2 (two) times daily. cardio 05/07/18  Yes [provider]  atorvastatin (LIPITOR) 80 MG tablet Take 1 tablet (80 mg total) by mouth daily. For cholesterol. 12/06/20  Yes Pleas Koch, NP  escitalopram (LEXAPRO) 5 MG tablet Take 1 tablet (5 mg total) by mouth daily. 07/22/20  Yes Lesleigh Noe, MD  furosemide (LASIX) 20 MG tablet Take 20 mg by mouth as needed. cardio 04/12/18 05/05/22 Yes [provider]  lisinopril (ZESTRIL) 5 MG tablet Take 1 tablet by mouth daily. cardio 05/02/18  Yes [provider]  metoprolol succinate (TOPROL-XL) 25 MG 24 hr tablet Take 12.5 mg by mouth daily. cardio 05/07/18 05/05/22 Yes  [provider]  pantoprazole (PROTONIX) 20 MG tablet Take 1 tablet (20 mg total) by mouth daily. For heartburn 12/06/20  Yes Pleas Koch, NP  traMADol (ULTRAM) 50 MG tablet Take 1 tablet (50 mg total) by mouth every 12 (twelve) hours as needed for up to 3 days. 01/22/21 01/25/21 Yes Danton Clap, PA-C  cyclobenzaprine (FLEXERIL) 5 MG tablet Take 1 tablet (5 mg total) by mouth 3 (three) times daily as needed for muscle spasms. 06/17/20   Lesleigh Noe, MD  nitroGLYCERIN (NITROSTAT) 0.4 MG SL tablet Place under the tongue. cardio 04/12/18   [provider]    Family History Family History  Problem Relation Age of Onset   Dementia Mother    Diabetes Mother    Dementia Father    Diabetes Father    Heart disease Father    Cancer Sister        unknown type   Diabetes Sister    Hyperlipidemia Sister    Cervical cancer Sister    Diabetes Brother     Social History Social History   Tobacco Use   Smoking status: Never   Smokeless tobacco: Never  Vaping Use   Vaping Use: Never used  Substance Use Topics   Alcohol use: No   Drug use: No     Allergies   Gluten meal and Methylprednisolone   Review of Systems Review of Systems  Musculoskeletal:  Positive for arthralgias, gait problem and joint swelling.  Skin:  Positive for wound (abrasion). Negative for color change.  Neurological:  Negative for syncope and numbness.  Hematological:  Bruises/bleeds easily.    Physical Exam Triage Vital Signs ED Triage Vitals  Enc Vitals Group     BP      Pulse      Resp      Temp      Temp src      SpO2      Weight      Height      Head Circumference      Peak Flow      Pain Score      Pain Loc      Pain Edu?      Excl. in [place]?    No data found.  Updated Vital Signs BP 109/67 (BP Location: Left Arm)    Pulse 71    Temp 99.1 F (37.3 C) (Oral)    Resp 14    Ht 5\' 8"  (1.727 m)    Wt 223 lb 8.7 oz (101.4 kg)    SpO2 97%    BMI 33.99 kg/m   Physical Exam Vitals and nursing note reviewed.  Constitutional:      General: She is not in acute distress.    Appearance: Normal appearance. She is not ill-appearing or toxic-appearing.  HENT:     Head: Normocephalic and atraumatic.  Eyes:     General: No scleral icterus.       Right eye: No discharge.        Left eye: No discharge.     Conjunctiva/sclera: Conjunctivae normal.  Cardiovascular:     Rate and Rhythm: Normal rate and regular rhythm.     Pulses: Normal pulses.  Pulmonary:     Effort: Pulmonary effort is normal. No respiratory distress.   Musculoskeletal:     Cervical back: Neck supple.     Right knee: Swelling (moderate swelling anterior knee), laceration (superficial abrasion anterior knee) and bony tenderness (patella) present. Decreased  range of motion (cannot flex beyond 90 degrees). Tenderness present over the medial joint line and lateral joint line.     Comments: Diffuse tenderness palpation of the knee.  There is palpation throughout the anterior and posterior knee.  Unable to perform special testing due to pain, guarding and swelling.  Skin:    General: Skin is dry.  Neurological:     General: No focal deficit present.     Mental Status: She is alert. Mental status is at baseline.     Motor: No weakness.     Gait: Gait abnormal (not weightbearing, in wheel chair. Came in using husbands cane).  Psychiatric:        Mood and Affect: Mood normal.        Behavior: Behavior normal.        Thought Content: Thought content normal.     UC Treatments / Results  Labs (all labs ordered are listed, but only abnormal results are displayed) Labs Reviewed - No data to display  EKG   Radiology DG Knee Complete 4 Views Right  Result Date: 01/22/2021 CLINICAL DATA:  fall onto knee today EXAM: RIGHT KNEE - COMPLETE 4+ VIEW COMPARISON:  None. FINDINGS: No evidence of fracture, dislocation, or joint effusion. No evidence of severe arthropathy. No aggressive appearing focal bone abnormality. Soft tissues are unremarkable. IMPRESSION: Negative. Electronically Signed   By: Iven Finn M.D.   On: 01/22/2021 17:54    Procedures Procedures (including critical care time)  Medications Ordered in UC Medications - No data to display  Initial Impression / Assessment and Plan / UC Course  I have reviewed the triage vital signs and the nursing notes.  Pertinent labs & imaging results that were available during my care of the patient were reviewed by me and considered in my medical decision making (see chart for  details).  66 year old female presenting for right knee pain and swelling.  Reports she fell onto the knee 3 hours ago.  Reports mild pain at rest when not moving knee but significant pain when she tries to bear weight which is very difficult.  No numbness, weakness or tingling reported.  Patient is on Eliquis.  Moderate swelling of anterior knee.  Diffuse tenderness palpation of the entire anterior and posterior knee.  Unable to perform special testing due to patient's pain, guarding and swelling.  She does have a superficial abrasion of the anterior knee as well.  Reduced range of motion of knee to 90 degrees of flexion.  X-ray of knee obtained.  Imaging reviewed by me.  No evidence of fracture.  Discussed this with patient.  Advised patient I cannot 100% rule out ligament tear but I believe her need to be sprained.  Unfortunately, we did not have any knee braces to fit.  Patient provided with large Ace wrap.  Patient declined crutches.  States she has crutches at home.  Reviewed RICE guidelines.  Provided patient with short supply of Ultram for severe pain after reviewing controlled substance database, especially since she cannot take NSAIDs due to being on Eliquis.  Reviewed close monitoring and to follow-up with [facility] if not improving over the next few days or if symptoms were to worsen in case she may need an MRI.  Patient agrees.   Final Clinical Impressions(s) / UC Diagnoses   Final diagnoses:  Acute pain of right knee  Swelling of joint of right knee     Discharge Instructions      KNEE PAIN: Stressed avoiding  painful activities . Reviewed RICE guidelines. Use medications as directed. If no improvement in the next 1-2 weeks or if worsening symptoms, f/u with PCP or ortho for reexamination, and please feel free to call or return at any time for any questions or concerns you may have and we will be happy to help you!      You may have a condition requiring you to follow up with  Orthopedics so please call one of the following office for appointment:   [facility] [address] [place], [place] [address] Phone: [phone]  [facility] [address], [place], [place] [address] Phone: [phone]      ED Prescriptions     Medication Sig Dispense Auth. Provider   traMADol (ULTRAM) 50 MG tablet Take 1 tablet (50 mg total) by mouth every 12 (twelve) hours as needed for up to 3 days. 6 tablet Danton Clap, PA-C      I have reviewed the PDMP during this encounter.   Danton Clap, PA-C 01/22/21 1836

## 2021-01-22 NOTE — ED Triage Notes (Signed)
Patient states that she fell outside while walking her puppies today.  Patient states that she twisted her right knee and landed on it.  Patient reports pain in the front of her right knee.

## 2021-01-22 NOTE — Discharge Instructions (Addendum)
KNEE PAIN: Stressed avoiding painful activities . Reviewed RICE guidelines. Use medications as directed. If no improvement in the next 1-2 weeks or if worsening symptoms, f/u with PCP or ortho for reexamination, and please feel free to call or return at any time for any questions or concerns you may have and we will be happy to help you!      You may have a condition requiring you to follow up with Orthopedics so please call one of the following office for appointment:   [facility] [address] [place], [place] [address] Phone: [phone]  [facility] [address] [place], [place] [address] Phone: [phone]

## 2021-02-10 ENCOUNTER — Other Ambulatory Visit: Payer: Self-pay

## 2021-02-10 ENCOUNTER — Ambulatory Visit (INDEPENDENT_AMBULATORY_CARE_PROVIDER_SITE_OTHER): Payer: Medicare Other | Admitting: Family

## 2021-02-10 ENCOUNTER — Encounter: Payer: Self-pay | Admitting: Family

## 2021-02-10 VITALS — BP 118/72 | HR 70 | Temp 97.6°F | Ht 68.0 in | Wt 227.0 lb

## 2021-02-10 DIAGNOSIS — M25561 Pain in right knee: Secondary | ICD-10-CM

## 2021-02-10 DIAGNOSIS — S8991XA Unspecified injury of right lower leg, initial encounter: Secondary | ICD-10-CM | POA: Diagnosis not present

## 2021-02-10 DIAGNOSIS — R911 Solitary pulmonary nodule: Secondary | ICD-10-CM | POA: Diagnosis not present

## 2021-02-10 NOTE — Patient Instructions (Signed)
Likely due for CT chest repeat after May 28th, 2023, you can discuss this with your primary care doctor at your follow up appointment. This way we can follow your pulmonary nodule and make sure it stays stable.   A referral was placed today for orthopedist, as you may need an MRI for ongoing right inner knee pain. I recommend you get a knee sleeve and or wrap it tightly to provide with stability. Ice/heat prn and tylenol prn.  Please let us know if you have not heard back within 1 week about your referral.  It was a pleasure seeing you today! Please do not hesitate to reach out with any questions and or concerns.  Regards,   Eugenia Pancoast FNP-C

## 2021-02-10 NOTE — Progress Notes (Signed)
Established Patient Office Visit  Subjective:  Patient ID: Joanne Clark, female    DOB: June 29, 1955  Age: 66 y.o. MRN: [id number]  CC:  Chief Complaint  Patient presents with   Follow-up    HPI Joanne Clark is here for hospital follow up.   Urgent care: [facility] Went to urgent care for R knee pain and swelling. She fell while playing with them outisde and twisted her knee. Painful to bear weight on the knee.   XRAY knee: no evidence of fracture, unable to r/o ligament tear. Pt given large ace wrap for support. Given short supply of ultram. She states slowly improving day to day, first time driving today. She does have pain still with bearing weight but much improved. Still with some bruising on right lower leg, front anterior shin and around knee. Some swelling at patella area. Limited rom due to pain but improving.   Able to take tylenol but only takes every few days, can not take NSAIDS.   CT soft tissue neck wo contrast done 12/14/20 with Dr. Pryor Ochoa, ENT. Noted incidentally was a 6 mm pulmonary nodule, radiologist recommended 6-12 month repeat of chest CT w/o contrast. This will be due around 06/13/21.   Past Medical History:  Diagnosis Date   Depression    GERD (gastroesophageal reflux disease)    Headache    Hiatal hernia    Hyperlipemia    Hypertension    Multiple gastric ulcers    Myocardial infarction [facility])    STEMI (ST elevation myocardial infarction) ([place]) 04/10/2018    Past Surgical History:  Procedure Laterality Date   CHOLECYSTECTOMY  2010   COLONOSCOPY W/ ENDOSCOPIC [place]  10/17/2017   ESOPHAGOGASTRODUODENOSCOPY (EGD) WITH PROPOFOL N/A 04/28/2020   Procedure: ESOPHAGOGASTRODUODENOSCOPY (EGD) WITH PROPOFOL;  Surgeon: Lin Landsman, MD;  Location: [place];  Service: Gastroenterology;  Laterality: N/A;    Family History  Problem Relation Age of Onset   Dementia Mother    Diabetes Mother    Dementia Father    Diabetes  Father    Heart disease Father    Cancer Sister        unknown type   Diabetes Sister    Hyperlipidemia Sister    Cervical cancer Sister    Diabetes Brother     Social History   Socioeconomic History   Marital status: Married    Spouse name: Joanne Clark   Number of children: 1   Years of education: some college   Highest education level: Not on file  Occupational History   Not on file  Tobacco Use   Smoking status: Never   Smokeless tobacco: Never  Vaping Use   Vaping Use: Never used  Substance and Sexual Activity   Alcohol use: No   Drug use: No   Sexual activity: Not Currently  Other Topics Concern   Not on file  Social History Narrative   01/16/20   From: [place], moved 2021 to be near daughter   Living: with husband, Joanne Clark [phone])   Work: retired Scientist, research (physical sciences)      Family: daughter Joanne Clark      Enjoys: reading, games, walking the park       Exercise: walking 2-3 times a week   Diet: tires to do heart healthy diet      Safety   Seat belts: Yes    Guns: Yes  and secure   Safe in relationships: Yes    Social Determinants of Health  Financial Resource Strain: Not on file  Food Insecurity: Not on file  Transportation Needs: Not on file  Physical Activity: Not on file  Stress: Not on file  Social Connections: Not on file  Intimate Partner Violence: Not on file    Outpatient Medications Prior to Visit  Medication Sig Dispense Refill   apixaban (ELIQUIS) 5 MG TABS tablet Take 1 tablet by mouth 2 (two) times daily. cardio     atorvastatin (LIPITOR) 80 MG tablet Take 1 tablet (80 mg total) by mouth daily. For cholesterol. 90 tablet 0   cyclobenzaprine (FLEXERIL) 5 MG tablet Take 1 tablet (5 mg total) by mouth 3 (three) times daily as needed for muscle spasms. 30 tablet 1   escitalopram (LEXAPRO) 5 MG tablet Take 1 tablet (5 mg total) by mouth daily. 90 tablet 3   furosemide (LASIX) 20 MG tablet Take 20 mg by mouth as needed. cardio     lisinopril  (ZESTRIL) 5 MG tablet Take 1 tablet by mouth daily. cardio     metoprolol succinate (TOPROL-XL) 25 MG 24 hr tablet Take 12.5 mg by mouth daily. cardio     nitroGLYCERIN (NITROSTAT) 0.4 MG SL tablet Place under the tongue. cardio     pantoprazole (PROTONIX) 20 MG tablet Take 1 tablet (20 mg total) by mouth daily. For heartburn 90 tablet 0   No facility-administered medications prior to visit.    Allergies  Allergen Reactions   Gluten Meal     Other reaction(s): GI Reaction    ROS Review of Systems  Constitutional:  Negative for chills, fatigue and fever.  Respiratory:  Negative for shortness of breath and wheezing.   Cardiovascular:  Negative for chest pain and palpitations.  Musculoskeletal:  Positive for arthralgias (pain right knee, limited rom with pain) and joint swelling (right knee). Myalgias: , left knee mild.     Objective:    Physical Exam Vitals reviewed.  Constitutional:      General: She is not in acute distress.    Appearance: Normal appearance. She is obese. She is not ill-appearing, toxic-appearing or diaphoretic.  Musculoskeletal:     Right knee: Swelling, effusion and ecchymosis (improving, right upper shin anterior) present. No crepitus. Decreased range of motion (painful). Tenderness (point tenderness right medial knee) present over the MCL (tenderness). Normal pulse.     Instability Tests: Anterior drawer test negative.     Left knee: Swelling (mild surrounding patella) present. Normal pulse.     Instability Tests: Anterior drawer test negative.  Neurological:     General: No focal deficit present.     Mental Status: She is alert and oriented to person, place, and time.  Psychiatric:        Mood and Affect: Mood normal.        Behavior: Behavior normal.        Thought Content: Thought content normal.        Judgment: Judgment normal.    BP 118/72    Pulse 70    Temp 97.6 F (36.4 C) (Temporal)    Ht 5\' 8"  (1.727 m)    Wt 227 lb (103 kg)    SpO2 97%     BMI 34.52 kg/m  Wt Readings from Last 3 Encounters:  02/10/21 227 lb (103 kg)  01/22/21 223 lb 8.7 oz (101.4 kg)  10/22/20 223 lb 8 oz (101.4 kg)     Health Maintenance Due  Topic Date Due   Hepatitis C Screening  Never done  Zoster Vaccines- Shingrix (1 of 2) Never done   DEXA SCAN  Never done   Pneumonia Vaccine 39+ Years old (2 - PPSV23 if available, else PCV20) 04/13/2019   TETANUS/TDAP  06/22/2020   COVID-19 Vaccine (5 - Booster for Pfizer series) 06/25/2020    There are no preventive care reminders to display for this patient.  No results found for: TSH Lab Results  Component Value Date   WBC 5.9 04/06/2020   HGB 13.4 04/06/2020   HCT 40.7 04/06/2020   MCV 90.2 04/06/2020   PLT 222 04/06/2020   Lab Results  Component Value Date   NA 138 04/06/2020   K 3.6 04/06/2020   CO2 26 04/06/2020   GLUCOSE 120 (H) 04/06/2020   BUN 17 04/06/2020   CREATININE 0.72 04/06/2020   BILITOT 0.5 06/18/2019   ALKPHOS 118 06/18/2019   AST 23 06/18/2019   ALT 29 06/18/2019   PROT 7.2 12/23/2017   ALBUMIN 4.3 06/18/2019   CALCIUM 9.2 04/06/2020   ANIONGAP 6 04/06/2020   No results found for: CHOL No results found for: HDL No results found for: LDLCALC No results found for: TRIG No results found for: CHOLHDL No results found for: HGBA1C    Assessment & Plan:   Problem List Items Addressed This Visit       Other   Pain of meniscus of right knee    Referral placed for orthopedics also advised patient to right wear a daily knee sleeve as this may help with support.  Patient continue with Tylenol as needed and use warm compresses/ice throughout the day as well as elevation to help as well.      Relevant Orders   AMB referral to orthopedics   Right knee injury, initial encounter - Primary    Patient referred to orthopedics for ongoing management.      Relevant Orders   AMB referral to orthopedics   Incidental pulmonary nodule, > 33mm and < 76mm    This was  incidentally seen on CAT scan of the soft tissue of the neck that was ordered by Dr. Pryor Ochoa back in November 2022.  Pulmonary nodule 6 mm and recommended to repeated 6 to 12 months thereafter with a CT of the chest without contrast.  This would make her due in 56-month repeat around May 28 and/or after.  Patient aware.  She will follow-up with her primary care provider in the next few months prior to major as needed and discussed with her about ordering a CT of the chest.       No orders of the defined types were placed in this encounter.   Follow-up: Return in about 3 months (around 05/11/2021) for follow up appt with Dr. Einar Pheasant, PCP.    Eugenia Pancoast, FNP

## 2021-02-11 DIAGNOSIS — R911 Solitary pulmonary nodule: Secondary | ICD-10-CM | POA: Insufficient documentation

## 2021-02-11 NOTE — Assessment & Plan Note (Signed)
This was incidentally seen on CAT scan of the soft tissue of the neck that was ordered by Dr. Pryor Ochoa back in November 2022.  Pulmonary nodule 6 mm and recommended to repeated 6 to 12 months thereafter with a CT of the chest without contrast.  This would make her due in 1-month repeat around May 28 and/or after.  Patient aware.  She will follow-up with her primary care provider in the next few months prior to major as needed and discussed with her about ordering a CT of the chest.

## 2021-02-11 NOTE — Assessment & Plan Note (Signed)
Referral placed for orthopedics also advised patient to right wear a daily knee sleeve as this may help with support.  Patient continue with Tylenol as needed and use warm compresses/ice throughout the day as well as elevation to help as well.

## 2021-02-11 NOTE — Assessment & Plan Note (Signed)
Patient referred to orthopedics for ongoing management.

## 2021-02-16 ENCOUNTER — Other Ambulatory Visit: Payer: Self-pay

## 2021-02-16 ENCOUNTER — Encounter: Payer: Self-pay | Admitting: Orthopaedic Surgery

## 2021-02-16 ENCOUNTER — Ambulatory Visit: Payer: Medicare Other | Admitting: Orthopaedic Surgery

## 2021-02-16 DIAGNOSIS — M25561 Pain in right knee: Secondary | ICD-10-CM

## 2021-02-16 DIAGNOSIS — G8929 Other chronic pain: Secondary | ICD-10-CM

## 2021-02-16 MED ORDER — TRAMADOL HCL 50 MG PO TABS: 50.0000 mg | ORAL_TABLET | Freq: Two times a day (BID) | ORAL | 2 refills | Status: DC | PRN

## 2021-02-16 NOTE — Progress Notes (Signed)
Office Visit Note   Patient: Joanne Clark           Date of Birth: 1955/02/07           MRN: [id number] Visit Date: 02/16/2021              Requested by: Eugenia Pancoast, [place],  [place] [id number] PCP: Lesleigh Noe, MD   Assessment & Plan: Visit Diagnoses:  1. Chronic pain of right knee     Plan: Impression is right knee MCL sprain and possible medial and lateral meniscal tears.  I would like to provide the patient with a hinged knee brace weightbearing as tolerated.  I would also like to order an MRI to assess for structural abnormalities.  Follow-up with [place] once has been completed.  Follow-Up Instructions: Return for after MRI.   Orders:  No orders of the defined types were placed in this encounter.  Meds ordered this encounter  Medications   traMADol (ULTRAM) 50 MG tablet    Sig: Take 1 tablet (50 mg total) by mouth every 12 (twelve) hours as needed.    Dispense:  60 tablet    Refill:  2      Procedures: No procedures performed   Clinical Data: No additional findings.   Subjective: Chief Complaint  Patient presents with   Right Knee - Pain    HPI patient is a pleasant 66 year old female who comes in today with right knee pain.  On 01/22/2021, she twisted her right knee causing her to fall to the ground as she was trying to catch her dog.  She fell landing on the front of her knee.  She was seen where x-rays were obtained.  X-rays are negative for fracture.  She comes in today for further evaluation treatment recommendation.  The pain is somewhat better.  The pain she is having is to the anteromedial knee.  She denies any locking or catching.  She does have increased pain with driving, pivoting or flexion of the knee.  She has been using ice and taking Tylenol without significant relief.  She denies any previous pain to the right knee.  Review of Systems as detailed in HPI.  All others reviewed and are negative.   Objective: Vital  Signs: There were no vitals taken for this visit.  Physical Exam well-developed well-nourished female no acute distress.  Alert and oriented x3.  Ortho Exam right knee exam shows a small effusion.  She has marked tenderness medial lateral joint line in addition to tenderness along the MCL.  She has slight pain and laxity with valgus stress.  She is neurovascular intact distally.  Specialty Comments:  No specialty comments available.  Imaging: No new imaging   PMFS History: Patient Active Problem List   Diagnosis Date Noted   Incidental pulmonary nodule, > 62mm and < 39mm 02/11/2021   Pain of meniscus of right knee 02/10/2021   Right knee injury, initial encounter 02/10/2021   Ankle swelling, right 10/22/2020   Muscle strain of right lower leg 06/17/2020   Parotid nodule 05/04/2020   Reactive depression 05/04/2020   Pain of upper abdomen    Stress and adjustment reaction 01/16/2020   Mixed hyperlipidemia 02/14/2019   Atrial fibrillation ([place]) 05/20/2018   Hiatal hernia 05/20/2018   Migraine headache 05/20/2018   Chronic combined systolic and diastolic heart failure ([place]) 05/20/2018   Presence of stent in coronary artery 05/20/2018   PUD (peptic ulcer disease) 05/20/2018  History of ST elevation myocardial infarction (STEMI) 04/10/2018   History of cholecystectomy 01/28/2010   Past Medical History:  Diagnosis Date   Depression    GERD (gastroesophageal reflux disease)    Headache    Hiatal hernia    Hyperlipemia    Hypertension    Multiple gastric ulcers    Myocardial infarction [facility])    STEMI (ST elevation myocardial infarction) ([place]) 04/10/2018    Family History  Problem Relation Age of Onset   Dementia Mother    Diabetes Mother    Dementia Father    Diabetes Father    Heart disease Father    Cancer Sister        unknown type   Diabetes Sister    Hyperlipidemia Sister    Cervical cancer Sister    Diabetes Brother     Past Surgical History:  Procedure  Laterality Date   CHOLECYSTECTOMY  2010   COLONOSCOPY W/ ENDOSCOPIC [place]  10/17/2017   ESOPHAGOGASTRODUODENOSCOPY (EGD) WITH PROPOFOL N/A 04/28/2020   Procedure: ESOPHAGOGASTRODUODENOSCOPY (EGD) WITH PROPOFOL;  Surgeon: Lin Landsman, MD;  Location: [facility];  Service: Gastroenterology;  Laterality: N/A;   Social History   Occupational History   Not on file  Tobacco Use   Smoking status: Never   Smokeless tobacco: Never  Vaping Use   Vaping Use: Never used  Substance and Sexual Activity   Alcohol use: No   Drug use: No   Sexual activity: Not Currently

## 2021-02-17 NOTE — Telephone Encounter (Signed)
Called and left detailed message on patient's phone asking about this medication. Ok per [employer] On file

## 2021-02-23 ENCOUNTER — Ambulatory Visit
Admission: RE | Admit: 2021-02-23 | Discharge: 2021-02-23 | Disposition: A | Discharge status: Home / Self Care | Payer: Medicare Other | Source: Ambulatory Visit | Attending: Orthopaedic Surgery | Admitting: Orthopaedic Surgery

## 2021-02-23 DIAGNOSIS — G8929 Other chronic pain: Secondary | ICD-10-CM

## 2021-02-23 DIAGNOSIS — M25561 Pain in right knee: Secondary | ICD-10-CM | POA: Diagnosis not present

## 2021-02-24 NOTE — Progress Notes (Signed)
Needs f/u appt 

## 2021-02-25 ENCOUNTER — Telehealth: Payer: Self-pay | Admitting: Orthopaedic Surgery

## 2021-02-25 NOTE — Telephone Encounter (Signed)
LMOM for pt to return call to get sch for mri follow up with Dr. Erlinda Hong

## 2021-03-02 ENCOUNTER — Telehealth: Payer: Self-pay | Admitting: Family Medicine

## 2021-03-02 NOTE — Telephone Encounter (Signed)
LVM for pt to rtn my call to schedule AWV with [facility]. Please schedule this appt if pt calls the office.  °

## 2021-03-09 ENCOUNTER — Ambulatory Visit (INDEPENDENT_AMBULATORY_CARE_PROVIDER_SITE_OTHER): Payer: Medicare Other | Admitting: Orthopaedic Surgery

## 2021-03-09 ENCOUNTER — Encounter: Payer: Self-pay | Admitting: Orthopaedic Surgery

## 2021-03-09 ENCOUNTER — Other Ambulatory Visit: Payer: Self-pay

## 2021-03-09 DIAGNOSIS — G8929 Other chronic pain: Secondary | ICD-10-CM | POA: Diagnosis not present

## 2021-03-09 DIAGNOSIS — M25561 Pain in right knee: Secondary | ICD-10-CM

## 2021-03-09 NOTE — Progress Notes (Signed)
Office Visit Note   Patient: Joanne Clark           Date of Birth: 02-13-1955           MRN: [id number] Visit Date: 03/09/2021              Requested by: Lesleigh Noe, MD [place],   [id number] PCP: Lesleigh Noe, MD   Assessment & Plan: Visit Diagnoses:  1. Chronic pain of right knee     Plan: Impression is right knee pain which has improved.  Recent MRI shows fraying of the medial lateral meniscus.  We have discussed proceeding with intra-articular cortisone injection as well as trying a course of formal physical therapy.  She notes that she is on a fixed income and is not interested in physical therapy at this time.  She will follow-up with [place] as needed.  Follow-Up Instructions: Return if symptoms worsen or fail to improve.   Orders:  Orders Placed This Encounter  Procedures   Large Joint Inj: R knee   No orders of the defined types were placed in this encounter.     Procedures: Large Joint Inj: R knee on 03/09/2021 9:54 AM Indications: pain Details: 22 G needle, anterolateral approach Medications: 2 mL lidocaine 1 %; 2 mL bupivacaine 0.25 %; 40 mg methylPREDNISolone acetate 40 MG/ML     Clinical Data: No additional findings.   Subjective: Chief Complaint  Patient presents with   Right Knee - Follow-up    MRI review    HPI patient is a pleasant 66 year old female who comes in today to discuss MRI results of the right knee.  She was seen by [place] a few weeks ago following an injury to the right knee.  It was thought that she had an MCL sprain in addition to medial and lateral meniscal tears.  She was placed in a hinged knee brace.  She is here today for follow-up.  She notes that her pain has improved quite a bit.  She continues to endorse pain to the medial aspect, however.  MRI of the right knee shows fraying of the medial lateral meniscus.  No other acute or structural abnormalities     Objective: Vital Signs: There were no vitals  taken for this visit.    Ortho Exam unchanged right knee exam  Specialty Comments:  No specialty comments available.  Imaging: No new imaging   PMFS History: Patient Active Problem List   Diagnosis Date Noted   Incidental pulmonary nodule, > 85mm and < 63mm 02/11/2021   Pain of meniscus of right knee 02/10/2021   Right knee injury, initial encounter 02/10/2021   Ankle swelling, right 10/22/2020   Muscle strain of right lower leg 06/17/2020   Parotid nodule 05/04/2020   Reactive depression 05/04/2020   Pain of upper abdomen    Stress and adjustment reaction 01/16/2020   Mixed hyperlipidemia 02/14/2019   Atrial fibrillation ([place]) 05/20/2018   Hiatal hernia 05/20/2018   Migraine headache 05/20/2018   Chronic combined systolic and diastolic heart failure ([place]) 05/20/2018   Presence of stent in coronary artery 05/20/2018   PUD (peptic ulcer disease) 05/20/2018   History of ST elevation myocardial infarction (STEMI) 04/10/2018   History of cholecystectomy 01/28/2010   Past Medical History:  Diagnosis Date   Depression    GERD (gastroesophageal reflux disease)    Headache    Hiatal hernia    Hyperlipemia    Hypertension    Multiple  gastric ulcers    Myocardial infarction [facility])    STEMI (ST elevation myocardial infarction) ([place]) 04/10/2018    Family History  Problem Relation Age of Onset   Dementia Mother    Diabetes Mother    Dementia Father    Diabetes Father    Heart disease Father    Cancer Sister        unknown type   Diabetes Sister    Hyperlipidemia Sister    Cervical cancer Sister    Diabetes Brother     Past Surgical History:  Procedure Laterality Date   CHOLECYSTECTOMY  2010   COLONOSCOPY W/ ENDOSCOPIC [place]  10/17/2017   ESOPHAGOGASTRODUODENOSCOPY (EGD) WITH PROPOFOL N/A 04/28/2020   Procedure: ESOPHAGOGASTRODUODENOSCOPY (EGD) WITH PROPOFOL;  Surgeon: Lin Landsman, MD;  Location: [facility];  Service: Gastroenterology;  Laterality: N/A;    Social History   Occupational History   Not on file  Tobacco Use   Smoking status: Never   Smokeless tobacco: Never  Vaping Use   Vaping Use: Never used  Substance and Sexual Activity   Alcohol use: No   Drug use: No   Sexual activity: Not Currently

## 2021-03-10 MED ORDER — BUPIVACAINE HCL 0.25 % IJ SOLN
2.0000 mL | INTRAMUSCULAR | Status: AC | PRN
  Administered 2021-03-09: 2 mL via INTRA_ARTICULAR

## 2021-03-10 MED ORDER — METHYLPREDNISOLONE ACETATE 40 MG/ML IJ SUSP
40.0000 mg | INTRAMUSCULAR | Status: AC | PRN
  Administered 2021-03-09: 40 mg via INTRA_ARTICULAR

## 2021-03-10 MED ORDER — LIDOCAINE HCL 1 % IJ SOLN
2.0000 mL | INTRAMUSCULAR | Status: AC | PRN
  Administered 2021-03-09: 2 mL

## 2021-04-15 ENCOUNTER — Telehealth: Payer: Self-pay | Admitting: Family Medicine

## 2021-04-15 NOTE — Telephone Encounter (Signed)
Called pt to schedule AWV with [facility], but no answer. Will call pt back at a later time. ?

## 2021-05-14 ENCOUNTER — Other Ambulatory Visit: Payer: Self-pay | Admitting: Primary Care

## 2021-05-24 ENCOUNTER — Ambulatory Visit (INDEPENDENT_AMBULATORY_CARE_PROVIDER_SITE_OTHER): Payer: Medicare Other | Admitting: Family Medicine

## 2021-05-24 ENCOUNTER — Encounter: Payer: Self-pay | Admitting: *Deleted

## 2021-05-24 ENCOUNTER — Telehealth: Payer: Self-pay | Admitting: Family Medicine

## 2021-05-24 VITALS — BP 100/60 | HR 72 | Temp 97.9°F | Ht 68.0 in | Wt 228.2 lb

## 2021-05-24 DIAGNOSIS — E782 Mixed hyperlipidemia: Secondary | ICD-10-CM

## 2021-05-24 DIAGNOSIS — I252 Old myocardial infarction: Secondary | ICD-10-CM

## 2021-05-24 DIAGNOSIS — R911 Solitary pulmonary nodule: Secondary | ICD-10-CM

## 2021-05-24 DIAGNOSIS — I5042 Chronic combined systolic (congestive) and diastolic (congestive) heart failure: Secondary | ICD-10-CM

## 2021-05-24 DIAGNOSIS — I4891 Unspecified atrial fibrillation: Secondary | ICD-10-CM

## 2021-05-24 DIAGNOSIS — K118 Other diseases of salivary glands: Secondary | ICD-10-CM | POA: Diagnosis not present

## 2021-05-24 LAB — CBC WITH DIFFERENTIAL/PLATELET
Basophils Absolute: 0.1 10*3/uL (ref 0.0–0.1)
Basophils Relative: 0.9 % (ref 0.0–3.0)
Eosinophils Absolute: 0.2 10*3/uL (ref 0.0–0.7)
Eosinophils Relative: 2.6 % (ref 0.0–5.0)
HCT: 38.2 % (ref 36.0–46.0)
Hemoglobin: 12.9 g/dL (ref 12.0–15.0)
Lymphocytes Relative: 25.4 % (ref 12.0–46.0)
Lymphs Abs: 1.5 10*3/uL (ref 0.7–4.0)
MCHC: 33.7 g/dL (ref 30.0–36.0)
MCV: 90.2 fl (ref 78.0–100.0)
Monocytes Absolute: 0.4 10*3/uL (ref 0.1–1.0)
Monocytes Relative: 7.1 % (ref 3.0–12.0)
Neutro Abs: 3.8 10*3/uL (ref 1.4–7.7)
Neutrophils Relative %: 64 % (ref 43.0–77.0)
Platelets: 244 10*3/uL (ref 150.0–400.0)
RBC: 4.23 Mil/uL (ref 3.87–5.11)
RDW: 14.3 % (ref 11.5–15.5)
WBC: 6 10*3/uL (ref 4.0–10.5)

## 2021-05-24 LAB — LIPID PANEL
Cholesterol: 139 mg/dL (ref 0–200)
HDL: 63.3 mg/dL (ref >39.00)
LDL Cholesterol: 56 mg/dL (ref 0–99)
NonHDL: 75.74
Total CHOL/HDL Ratio: 2
Triglycerides: 97 mg/dL (ref 0.0–149.0)
VLDL: 19.4 mg/dL (ref 0.0–40.0)

## 2021-05-24 LAB — COMPREHENSIVE METABOLIC PANEL
ALT: 24 U/L (ref 0–35)
AST: 18 U/L (ref 0–37)
Albumin: 4.1 g/dL (ref 3.5–5.2)
Alkaline Phosphatase: 92 U/L (ref 39–117)
BUN: 15 mg/dL (ref 6–23)
CO2: 29 mEq/L (ref 19–32)
Calcium: 9.3 mg/dL (ref 8.4–10.5)
Chloride: 104 mEq/L (ref 96–112)
Creatinine, Ser: 0.77 mg/dL (ref 0.40–1.20)
GFR: 79.49 mL/min (ref >60.00)
Glucose, Bld: 119 mg/dL — ABNORMAL HIGH (ref 70–99)
Potassium: 4.1 mEq/L (ref 3.5–5.1)
Sodium: 140 mEq/L (ref 135–145)
Total Bilirubin: 0.8 mg/dL (ref 0.2–1.2)
Total Protein: 6.7 g/dL (ref 6.0–8.3)

## 2021-05-24 MED ORDER — ATORVASTATIN CALCIUM 80 MG PO TABS: 80.0000 mg | ORAL_TABLET | Freq: Every day | ORAL | 0 refills | Status: DC

## 2021-05-24 NOTE — Assessment & Plan Note (Signed)
She is due for follow-up imaging, this was ordered today. ?

## 2021-05-24 NOTE — Assessment & Plan Note (Signed)
Reviewed repeat imaging with patient.  Including possible diagnosis and etiology of his Sjogren's, HIV, and lymphoma.  Patient declined further testing for Sjogren's or HIV due to lack of dry eyes and low risk factors.  She will update if she starts developing dry eyes.  CBC ordered to evaluate for possible cancer discussed this is an imperfect test.  At this point she has not palpated the nodules any further she did see ENT last fall and they had no further work-up at that time.  Do not think think further imaging would be helpful based on previous CT scan. ?

## 2021-05-24 NOTE — Patient Instructions (Signed)
Call [facility] outpatient imaging if you don't hear from them ? ?Labs today ? ?Monitor for dry eyes ?

## 2021-05-24 NOTE — Telephone Encounter (Signed)
Routing to MA to print this note as well as patient medication to attach to medical clearance.  ? ?Include allergies, medications.  ? ?Pt was seen today, however, this was not discussed in detail but she was well and no symptoms.  ? ?She is on apixaban.  ? ?Advise holding for 1 day prior to low/moderate bleeding risk procedure or 2 days prior to high risk procedure.  ? ?Restart apixaban one day after procedure for low risk ?

## 2021-05-24 NOTE — Assessment & Plan Note (Signed)
Stable on apixaban 5 milligrams twice daily. ?

## 2021-05-24 NOTE — Assessment & Plan Note (Signed)
Continue atorvastatin 80 mg, metoprolol 12.5 mg. ?

## 2021-05-24 NOTE — Progress Notes (Signed)
? ?Subjective:  ? ?  ?Joanne Clark is a 66 y.o. female presenting for Follow-up (On imaging results and repeating them ) ?  ? ? ?HPI ? ?#Neck lesions ?- noted on 04/06/2020 ?- saw ENT  ?- was not able to feel them herself or the ENT ?- related to the saliva glands - will get severe pain when she eats ?- has started to feel a pea sized knot in the upper neck/shoulder ?- on abx for abscessed teeth until Tuesday ? ? ?Denies dry eyes ?Same partner x 50 years - not interested in HIV testing ? ?Reports - ENT not concerned  ? ? ?Review of Systems ? ? ?Social History  ? ?Tobacco Use  ?Smoking Status Never  ?Smokeless Tobacco Never  ? ? ? ?   ?Objective:  ?  ?BP Readings from Last 3 Encounters:  ?05/24/21 100/60  ?02/10/21 118/72  ?01/22/21 109/67  ? ?Wt Readings from Last 3 Encounters:  ?05/24/21 228 lb 4 oz (103.5 kg)  ?02/10/21 227 lb (103 kg)  ?01/22/21 223 lb 8.7 oz (101.4 kg)  ? ? ?BP 100/60   Pulse 72   Temp 97.9 ?F (36.6 ?C) (Oral)   Ht '5\' 8"'$  (1.727 m)   Wt 228 lb 4 oz (103.5 kg)   SpO2 97%   BMI 34.71 kg/m?  ? ? ?Physical Exam ?Constitutional:   ?   General: She is not in acute distress. ?   Appearance: She is well-developed. She is not diaphoretic.  ?HENT:  ?   Right Ear: External ear normal.  ?   Left Ear: External ear normal.  ?   Nose: Nose normal.  ?Eyes:  ?   Conjunctiva/sclera: Conjunctivae normal.  ?Cardiovascular:  ?   Rate and Rhythm: Normal rate and regular rhythm.  ?Pulmonary:  ?   Effort: Pulmonary effort is normal. No respiratory distress.  ?   Breath sounds: Normal breath sounds. No wheezing.  ?Musculoskeletal:  ?   Cervical back: Neck supple.  ?Skin: ?   General: Skin is warm and dry.  ?   Capillary Refill: Capillary refill takes less than 2 seconds.  ?Neurological:  ?   Mental Status: She is alert. Mental status is at baseline.  ?Psychiatric:     ?   Mood and Affect: Mood normal.     ?   Behavior: Behavior normal.  ? ? ? ? ? ?   ?Assessment & Plan:  ? ?Problem List Items Addressed This  Visit   ? ?  ? Cardiovascular and Mediastinum  ? Atrial fibrillation ([place])  ?  Stable on apixaban 5 milligrams twice daily. ? ?  ?  ? Relevant Medications  ? atorvastatin (LIPITOR) 80 MG tablet  ? Chronic combined systolic and diastolic heart failure ([place])  ?  Not currently having symptoms.  Continue Lasix 20 mg as needed.  Blood pressure is controlled continue lisinopril 5 mg, metoprolol 12.5 mg. ? ?  ?  ? Relevant Medications  ? atorvastatin (LIPITOR) 80 MG tablet  ?  ? Digestive  ? Parotid nodule  ?  Reviewed repeat imaging with patient.  Including possible diagnosis and etiology of his Sjogren's, HIV, and lymphoma.  Patient declined further testing for Sjogren's or HIV due to lack of dry eyes and low risk factors.  She will update if she starts developing dry eyes.  CBC ordered to evaluate for possible cancer discussed this is an imperfect test.  At this point she has not palpated the nodules any further  she did see ENT last fall and they had no further work-up at that time.  Do not think think further imaging would be helpful based on previous CT scan. ? ?  ?  ? Relevant Orders  ? CBC with Differential  ?  ? Other  ? History of ST elevation myocardial infarction (STEMI)  ?  Continue atorvastatin 80 mg, metoprolol 12.5 mg. ? ?  ?  ? Mixed hyperlipidemia  ?  Recheck lipids, continue atorvastatin 80 mg due to history of MI. ? ?  ?  ? Relevant Medications  ? atorvastatin (LIPITOR) 80 MG tablet  ? Other Relevant Orders  ? Comprehensive metabolic panel  ? Lipid panel  ? Incidental pulmonary nodule, > 55m and < 837m- Primary  ?  She is due for follow-up imaging, this was ordered today. ? ?  ?  ? Relevant Orders  ? CT Chest Wo Contrast  ? ? ? ?Return in about 6 months (around 11/24/2021) for Medicare wellness visit . ? ?JeLesleigh NoeMD ? ? ? ?

## 2021-05-24 NOTE — Assessment & Plan Note (Signed)
Not currently having symptoms.  Continue Lasix 20 mg as needed.  Blood pressure is controlled continue lisinopril 5 mg, metoprolol 12.5 mg. ?

## 2021-05-24 NOTE — Assessment & Plan Note (Signed)
Recheck lipids, continue atorvastatin 80 mg due to history of MI. ?

## 2021-05-25 NOTE — Telephone Encounter (Signed)
Clearance form completed and faxed to [facility], with med/problem list attached.  ?

## 2021-06-02 ENCOUNTER — Ambulatory Visit (INDEPENDENT_AMBULATORY_CARE_PROVIDER_SITE_OTHER): Payer: Medicare Other

## 2021-06-02 VITALS — Wt 228.0 lb

## 2021-06-02 DIAGNOSIS — Z0000 Encounter for general adult medical examination without abnormal findings: Secondary | ICD-10-CM | POA: Diagnosis not present

## 2021-06-02 NOTE — Patient Instructions (Signed)
Joanne Clark , ?Thank you for taking time to come for your Medicare Wellness Visit. I appreciate your ongoing commitment to your health goals. Please review the following plan we discussed and let me know if I can assist you in the future.  ? ?Screening recommendations/referrals: ?Colonoscopy: 10/17/17 ?Mammogram: 10/21/20 ?Bone Density: declined referral ?Recommended yearly ophthalmology/optometry visit for glaucoma screening and checkup ?Recommended yearly dental visit for hygiene and checkup ? ?Vaccinations: ?Influenza vaccine: 09/28/20 ?Pneumococcal vaccine: 04/13/18 ?Tdap vaccine: 06/23/10, due ?Shingles vaccine: n/d   ?Covid-19:02/28/19, 03/23/19, 10/22/19, 04/30/20 ? ?Advanced directives: no ? ?Conditions/risks identified: none ? ?Next appointment: Follow up in one year for your annual wellness visit 06/06/22 @ 1:15pm by phone ? ? ?Preventive Care 6 Years and Older, Female ?Preventive care refers to lifestyle choices and visits with your health care provider that can promote health and wellness. ?What does preventive care include? ?A yearly physical exam. This is also called an annual well check. ?Dental exams once or twice a year. ?Routine eye exams. Ask your health care provider how often you should have your eyes checked. ?Personal lifestyle choices, including: ?Daily care of your teeth and gums. ?Regular physical activity. ?Eating a healthy diet. ?Avoiding tobacco and drug use. ?Limiting alcohol use. ?Practicing safe sex. ?Taking low-dose aspirin every day. ?Taking vitamin and mineral supplements as recommended by your health care provider. ?What happens during an annual well check? ?The services and screenings done by your health care provider during your annual well check will depend on your age, overall health, lifestyle risk factors, and family history of disease. ?Counseling  ?Your health care provider may ask you questions about your: ?Alcohol use. ?Tobacco use. ?Drug use. ?Emotional well-being. ?Home and  relationship well-being. ?Sexual activity. ?Eating habits. ?History of falls. ?Memory and ability to understand (cognition). ?Work and work Statistician. ?Reproductive health. ?Screening  ?You may have the following tests or measurements: ?Height, weight, and BMI. ?Blood pressure. ?Lipid and cholesterol levels. These may be checked every 5 years, or more frequently if you are over 64 years old. ?Skin check. ?Lung cancer screening. You may have this screening every year starting at age 31 if you have a 30-pack-year history of smoking and currently smoke or have quit within the past 15 years. ?Fecal occult blood test (FOBT) of the stool. You may have this test every year starting at age 29. ?Flexible sigmoidoscopy or colonoscopy. You may have a sigmoidoscopy every 5 years or a colonoscopy every 10 years starting at age 23. ?Hepatitis C blood test. ?Hepatitis B blood test. ?Sexually transmitted disease (STD) testing. ?Diabetes screening. This is done by checking your blood sugar (glucose) after you have not eaten for a while (fasting). You may have this done every 1-3 years. ?Bone density scan. This is done to screen for osteoporosis. You may have this done starting at age 57. ?Mammogram. This may be done every 1-2 years. Talk to your health care provider about how often you should have regular mammograms. ?Talk with your health care provider about your test results, treatment options, and if necessary, the need for more tests. ?Vaccines  ?Your health care provider may recommend certain vaccines, such as: ?Influenza vaccine. This is recommended every year. ?Tetanus, diphtheria, and acellular pertussis (Tdap, Td) vaccine. You may need a Td booster every 10 years. ?Zoster vaccine. You may need this after age 37. ?Pneumococcal 13-valent conjugate (PCV13) vaccine. One dose is recommended after age 42. ?Pneumococcal polysaccharide (PPSV23) vaccine. One dose is recommended after age 30. ?Talk to your  health care provider  about which screenings and vaccines you need and how often you need them. ?This information is not intended to replace advice given to you by your health care provider. Make sure you discuss any questions you have with your health care provider. ?Document Released: 01/30/2015 Document Revised: 09/23/2015 Document Reviewed: 11/04/2014 ?Elsevier Interactive Patient Education ? 2017 [place]. ? ?Fall Prevention in the Home ?Falls can cause injuries. They can happen to people of all ages. There are many things you can do to make your home safe and to help prevent falls. ?What can I do on the outside of my home? ?Regularly fix the edges of walkways and driveways and fix any cracks. ?Remove anything that might make you trip as you walk through a door, such as a raised step or threshold. ?Trim any bushes or trees on the path to your home. ?Use bright outdoor lighting. ?Clear any walking paths of anything that might make someone trip, such as rocks or tools. ?Regularly check to see if handrails are loose or broken. Make sure that both sides of any steps have handrails. ?Any raised decks and porches should have guardrails on the edges. ?Have any leaves, snow, or ice cleared regularly. ?Use sand or salt on walking paths during winter. ?Clean up any spills in your garage right away. This includes oil or grease spills. ?What can I do in the bathroom? ?Use night lights. ?Install grab bars by the toilet and in the tub and shower. Do not use towel bars as grab bars. ?Use non-skid mats or decals in the tub or shower. ?If you need to sit down in the shower, use a plastic, non-slip stool. ?Keep the floor dry. Clean up any water that spills on the floor as soon as it happens. ?Remove soap buildup in the tub or shower regularly. ?Attach bath mats securely with double-sided non-slip rug tape. ?Do not have throw rugs and other things on the floor that can make you trip. ?What can I do in the bedroom? ?Use night lights. ?Make sure  that you have a light by your bed that is easy to reach. ?Do not use any sheets or blankets that are too big for your bed. They should not hang down onto the floor. ?Have a firm chair that has side arms. You can use this for support while you get dressed. ?Do not have throw rugs and other things on the floor that can make you trip. ?What can I do in the kitchen? ?Clean up any spills right away. ?Avoid walking on wet floors. ?Keep items that you use a lot in easy-to-reach places. ?If you need to reach something above you, use a strong step stool that has a grab bar. ?Keep electrical cords out of the way. ?Do not use floor polish or wax that makes floors slippery. If you must use wax, use non-skid floor wax. ?Do not have throw rugs and other things on the floor that can make you trip. ?What can I do with my stairs? ?Do not leave any items on the stairs. ?Make sure that there are handrails on both sides of the stairs and use them. Fix handrails that are broken or loose. Make sure that handrails are as long as the stairways. ?Check any carpeting to make sure that it is firmly attached to the stairs. Fix any carpet that is loose or worn. ?Avoid having throw rugs at the top or bottom of the stairs. If you do have throw rugs, attach them  to the floor with carpet tape. ?Make sure that you have a light switch at the top of the stairs and the bottom of the stairs. If you do not have them, ask someone to add them for you. ?What else can I do to help prevent falls? ?Wear shoes that: ?Do not have high heels. ?Have rubber bottoms. ?Are comfortable and fit you well. ?Are closed at the toe. Do not wear sandals. ?If you use a stepladder: ?Make sure that it is fully opened. Do not climb a closed stepladder. ?Make sure that both sides of the stepladder are locked into place. ?Ask someone to hold it for you, if possible. ?Clearly mark and make sure that you can see: ?Any grab bars or handrails. ?First and last steps. ?Where the edge of  each step is. ?Use tools that help you move around (mobility aids) if they are needed. These include: ?Canes. ?Walkers. ?Scooters. ?Crutches. ?Turn on the lights when you go into a dark area. Replace any ligh

## 2021-06-02 NOTE — Progress Notes (Signed)
Virtual Visit via Telephone Note  I connected with  Joanne Clark on 06/02/21 at  3:30 PM EDT by telephone and verified that I am speaking with the correct person using two identifiers.  Location: Patient: home Provider: [place] Persons participating in the virtual visit: [place]   I discussed the limitations, risks, security and privacy concerns of performing an evaluation and management service by telephone and the availability of in person appointments. The patient expressed understanding and agreed to proceed.  Interactive audio and video telecommunications were attempted between this nurse and patient, however failed, due to patient having technical difficulties OR patient did not have access to video capability.  We continued and completed visit with audio only.  Some vital signs may be absent or patient reported.   Dionisio David, LPN  Subjective:   Joanne Clark is a 66 y.o. female who presents for Medicare Annual (Subsequent) preventive examination.  Review of Systems           Objective:    There were no vitals filed for this visit. There is no height or weight on file to calculate BMI.     01/22/2021    5:24 PM 04/28/2020    7:47 AM 04/06/2020   10:58 AM 08/21/2019    2:50 PM 03/21/2018    1:36 PM 12/23/2017   12:45 PM 11/10/2015    7:16 PM  Advanced Directives  Does Patient Have a Medical Advance Directive? No No No No No No No  Would patient like information on creating a medical advance directive?   No - Patient declined No - Patient declined No - Patient declined      Current Medications (verified) Outpatient Encounter Medications as of 06/02/2021  Medication Sig   amoxicillin (AMOXIL) 500 MG capsule Take 500 mg by mouth every 8 (eight) hours.   apixaban (ELIQUIS) 5 MG TABS tablet Take 1 tablet by mouth 2 (two) times daily. cardio   atorvastatin (LIPITOR) 80 MG tablet Take 1 tablet (80 mg total) by mouth daily. For  cholesterol.   cyclobenzaprine (FLEXERIL) 5 MG tablet Take 1 tablet (5 mg total) by mouth 3 (three) times daily as needed for muscle spasms.   escitalopram (LEXAPRO) 5 MG tablet Take 1 tablet (5 mg total) by mouth daily.   furosemide (LASIX) 20 MG tablet Take 20 mg by mouth as needed. cardio   lisinopril (ZESTRIL) 5 MG tablet Take 1 tablet by mouth daily. cardio   metoprolol succinate (TOPROL-XL) 25 MG 24 hr tablet Take 12.5 mg by mouth daily. cardio   nitroGLYCERIN (NITROSTAT) 0.4 MG SL tablet Place under the tongue. cardio   pantoprazole (PROTONIX) 20 MG tablet TAKE 1 TABLET (20 MG TOTAL) BY MOUTH DAILY. FOR HEARTBURN   traMADol (ULTRAM) 50 MG tablet Take 1 tablet (50 mg total) by mouth every 12 (twelve) hours as needed.   No facility-administered encounter medications on file as of 06/02/2021.    Allergies (verified) Gluten meal   History: Past Medical History:  Diagnosis Date   Depression    GERD (gastroesophageal reflux disease)    Headache    Hiatal hernia    Hyperlipemia    Hypertension    Multiple gastric ulcers    Myocardial infarction [facility])    STEMI (ST elevation myocardial infarction) ([place]) 04/10/2018   Past Surgical History:  Procedure Laterality Date   CHOLECYSTECTOMY  2010   COLONOSCOPY W/ ENDOSCOPIC [place]  10/17/2017   ESOPHAGOGASTRODUODENOSCOPY (EGD) WITH PROPOFOL N/A 04/28/2020  Procedure: ESOPHAGOGASTRODUODENOSCOPY (EGD) WITH PROPOFOL;  Surgeon: Lin Landsman, MD;  Location: [facility] ENDOSCOPY;  Service: Gastroenterology;  Laterality: N/A;   Family History  Problem Relation Age of Onset   Dementia Mother    Diabetes Mother    Dementia Father    Diabetes Father    Heart disease Father    Cancer Sister        unknown type   Diabetes Sister    Hyperlipidemia Sister    Cervical cancer Sister    Diabetes Brother    Social History   Socioeconomic History   Marital status: Married    Spouse name: [name]   Number of children: 1   Years of education: some  college   Highest education level: Not on file  Occupational History   Not on file  Tobacco Use   Smoking status: Never   Smokeless tobacco: Never  Vaping Use   Vaping Use: Never used  Substance and Sexual Activity   Alcohol use: No   Drug use: No   Sexual activity: Not Currently  Other Topics Concern   Not on file  Social History Narrative   01/16/20   From: [place] originally, moved 2021 to be near daughter   Living: with husband, [name] [phone])   Work: retired Scientist, research (physical sciences)      Family: daughter [name]      Enjoys: reading, games, walking the park       Exercise: walking 2-3 times a week   Diet: tires to do heart healthy diet      Safety   Seat belts: Yes    Guns: Yes  and secure   Safe in relationships: Yes    Social Determinants of Radio broadcast assistant Strain: Not on file  Food Insecurity: Not on file  Transportation Needs: Not on file  Physical Activity: Not on file  Stress: Not on file  Social Connections: Not on file    Tobacco Counseling Counseling given: Not Answered   Clinical Intake:  Pre-visit preparation completed: Yes  Pain : No/denies pain     Diabetes: No  How often do you need to have someone help you when you read instructions, pamphlets, or other written materials from your doctor or pharmacy?: 1 - Never  Diabetic?no  Interpreter Needed?: No  Information entered by :: Kirke Shaggy, LPN   Activities of Daily Living     View : No data to display.          Patient Care Team: Lesleigh Noe, MD as PCP - General (Family Medicine) Royce Macadamia, MD as Referring Physician (Cardiology)  Indicate any recent Medical Services you may have received from other than [facility] providers in the past year (date may be approximate).     Assessment:   This is a routine wellness examination for [place].  Hearing/Vision screen No results found.  Dietary issues and exercise activities discussed:     Goals Addressed   None     Depression Screen    02/10/2021   11:36 AM 05/04/2020    9:06 AM 01/16/2020    3:15 PM 08/21/2019    2:48 PM 06/18/2019    3:45 PM 04/24/2019   10:02 AM 10/22/2018    1:45 PM  PHQ 2/9 Scores  PHQ - 2 Score 0 2 0 0 1 0 1  PHQ- 9 Score 0 4   2 0 3    Fall Risk    02/10/2021   11:37 AM 01/16/2020  3:15 PM 08/21/2019    2:54 PM 06/18/2019    3:45 PM 04/24/2019   10:01 AM  Fall Risk   Falls in the past year? 1 0 0 0 0  Number falls in past yr: 1 0 0    Injury with Fall? 0 0 0    Risk for fall due to :   No Fall Risks    Follow up  Falls evaluation completed Falls prevention discussed Falls evaluation completed Falls evaluation completed    [place]:  Any stairs in or around the home? Yes  If so, are there any without handrails? No  Home free of loose throw rugs in walkways, pet beds, electrical cords, etc? Yes  Adequate lighting in your home to reduce risk of falls? Yes   ASSISTIVE DEVICES UTILIZED TO PREVENT FALLS:  Life alert? No  Use of a cane, walker or w/c? No  Grab bars in the bathroom? No  Shower chair or bench in shower? Yes  Elevated toilet seat or a handicapped toilet? No    Cognitive Function:0 points 6 CIT        Immunizations Immunization History  Administered Date(s) Administered   Fluad Quad(high Dose 65+) 10/22/2018   Influenza-Unspecified 09/28/2020   PFIZER(Purple Top)SARS-COV-2 Vaccination 02/28/2019, 03/23/2019, 10/22/2019, 04/30/2020   Pneumococcal Conjugate-13 04/13/2018   Tdap 06/23/2010    TDAP status: Due, Education has been provided regarding the importance of this vaccine. Advised may receive this vaccine at local pharmacy or Health Dept. Aware to provide a copy of the vaccination record if obtained from local pharmacy or Health Dept. Verbalized acceptance and understanding.  Flu Vaccine status: Up to date  Pneumococcal vaccine status: Due, Education has been provided regarding the importance of this  vaccine. Advised may receive this vaccine at local pharmacy or Health Dept. Aware to provide a copy of the vaccination record if obtained from local pharmacy or Health Dept. Verbalized acceptance and understanding.  Covid-19 vaccine status: Completed vaccines  Qualifies for Shingles Vaccine? Yes   Zostavax completed No   Shingrix Completed?: No.    Education has been provided regarding the importance of this vaccine. Patient has been advised to call insurance company to determine out of pocket expense if they have not yet received this vaccine. Advised may also receive vaccine at local pharmacy or Health Dept. Verbalized acceptance and understanding.  Screening Tests Health Maintenance  Topic Date Due   Hepatitis C Screening  Never done   Zoster Vaccines- Shingrix (1 of 2) Never done   DEXA SCAN  Never done   Pneumonia Vaccine 37+ Years old (2 - PPSV23 if available, else PCV20) 04/13/2019   TETANUS/TDAP  06/22/2020   COVID-19 Vaccine (5 - Booster for Pfizer series) 06/25/2020   INFLUENZA VACCINE  08/17/2021   MAMMOGRAM  10/22/2022   COLONOSCOPY (Pts 45-28yr Insurance coverage will need to be confirmed)  10/18/2027   HPV VACCINES  Aged Out    Health Maintenance  Health Maintenance Due  Topic Date Due   Hepatitis C Screening  Never done   Zoster Vaccines- Shingrix (1 of 2) Never done   DEXA SCAN  Never done   Pneumonia Vaccine 68 Years old (2 - PPSV23 if available, else PCV20) 04/13/2019   TETANUS/TDAP  06/22/2020   COVID-19 Vaccine (5 - Booster for Pfizer series) 06/25/2020    Colorectal cancer screening: Type of screening: Colonoscopy. Completed 10/17/17. Repeat every 10 years  Mammogram status: Completed 10/21/20. Repeat every year  Declined [facility] referral  Lung Cancer Screening: (Low Dose CT Chest recommended if Age 40-80 years, 30 pack-year currently smoking OR have quit w/in 15years.) does not qualify.   Additional Screening:  Hepatitis C Screening: does qualify;  Completed no  Vision Screening: Recommended annual ophthalmology exams for early detection of glaucoma and other disorders of the eye. Is the patient up to date with their annual eye exam?  No  Who is the provider or what is the name of the office in which the patient attends annual eye exams? No one If pt is not established with a provider, would they like to be referred to a provider to establish care? No .   Dental Screening: Recommended annual dental exams for proper oral hygiene  Community Resource Referral / Chronic Care Management: CRR required this visit?  No   CCM required this visit?  No      Plan:     I have personally reviewed and noted the following in the patient's chart:   Medical and social history Use of alcohol, tobacco or illicit drugs  Current medications and supplements including opioid prescriptions.  Functional ability and status Nutritional status Physical activity Advanced directives List of other physicians Hospitalizations, surgeries, and ER visits in previous 12 months Vitals Screenings to include cognitive, depression, and falls Referrals and appointments  In addition, I have reviewed and discussed with patient certain preventive protocols, quality metrics, and best practice recommendations. A written personalized care plan for preventive services as well as general preventive health recommendations were provided to patient.     Dionisio David, LPN   8/83/2549   Nurse Notes: none

## 2021-06-07 ENCOUNTER — Ambulatory Visit
Admission: RE | Admit: 2021-06-07 | Discharge: 2021-06-07 | Disposition: A | Discharge status: Home / Self Care | Payer: Medicare Other | Source: Ambulatory Visit | Attending: Family Medicine | Admitting: Family Medicine

## 2021-06-07 DIAGNOSIS — R911 Solitary pulmonary nodule: Secondary | ICD-10-CM | POA: Diagnosis not present

## 2021-06-16 DIAGNOSIS — I252 Old myocardial infarction: Secondary | ICD-10-CM | POA: Diagnosis not present

## 2021-06-16 DIAGNOSIS — I255 Ischemic cardiomyopathy: Secondary | ICD-10-CM | POA: Diagnosis not present

## 2021-06-16 DIAGNOSIS — I25118 Atherosclerotic heart disease of native coronary artery with other forms of angina pectoris: Secondary | ICD-10-CM | POA: Diagnosis not present

## 2021-08-10 ENCOUNTER — Other Ambulatory Visit: Payer: Self-pay | Admitting: Family Medicine

## 2021-08-10 DIAGNOSIS — F329 Major depressive disorder, single episode, unspecified: Secondary | ICD-10-CM

## 2021-08-11 ENCOUNTER — Other Ambulatory Visit: Payer: Self-pay | Admitting: Family Medicine

## 2021-08-11 NOTE — Telephone Encounter (Signed)
Patient has been scheduled

## 2021-09-06 ENCOUNTER — Ambulatory Visit (INDEPENDENT_AMBULATORY_CARE_PROVIDER_SITE_OTHER): Payer: Medicare Other | Admitting: Family Medicine

## 2021-09-06 ENCOUNTER — Encounter: Payer: Self-pay | Admitting: Family Medicine

## 2021-09-06 VITALS — BP 106/62 | HR 93 | Temp 97.9°F | Resp 16 | Ht 67.0 in | Wt 227.1 lb

## 2021-09-06 DIAGNOSIS — F324 Major depressive disorder, single episode, in partial remission: Secondary | ICD-10-CM

## 2021-09-06 DIAGNOSIS — F329 Major depressive disorder, single episode, unspecified: Secondary | ICD-10-CM

## 2021-09-06 DIAGNOSIS — Z1231 Encounter for screening mammogram for malignant neoplasm of breast: Secondary | ICD-10-CM | POA: Diagnosis not present

## 2021-09-06 DIAGNOSIS — Z0000 Encounter for general adult medical examination without abnormal findings: Secondary | ICD-10-CM | POA: Diagnosis not present

## 2021-09-06 MED ORDER — ESCITALOPRAM OXALATE 10 MG PO TABS: 10.0000 mg | ORAL_TABLET | Freq: Every day | ORAL | 0 refills | Status: DC

## 2021-09-06 NOTE — Assessment & Plan Note (Signed)
Worsening symptoms. Increase lexapro 5>10 mg.  Update in 4 to 6 weeks if no improvement.

## 2021-09-06 NOTE — Patient Instructions (Addendum)
Check records for PPSV-23 (pneumonia Shot) - can get at the pharmacy or at our office   Increase lexapro to 10 mg (new medication sent)  Update in 4-6 weeks

## 2021-09-06 NOTE — Progress Notes (Signed)
Annual Exam   Chief Complaint:  Chief Complaint  Patient presents with   Annual Exam    History of Present Illness:  [name] is a 66 y.o. No obstetric history on file. who LMP was No LMP recorded. Patient is postmenopausal., presents today for her annual examination.    Low appetite Has been feeling down Has some puppies   Nutrition She does get adequate calcium and Vitamin D in her diet. Diet: poor appetite recently Exercise: has been taking her puppies out for a walk    Social History   Tobacco Use  Smoking Status Never  Smokeless Tobacco Never   Social History   Substance and Sexual Activity  Alcohol Use No   Social History   Substance and Sexual Activity  Drug Use No     General Health Dentist in the last year: No Eye doctor: appt coming up   Safety The patient wears seatbelts: yes.     The patient feels safe at home and in their relationships: yes.   Menstrual:  Symptoms of menopause: none   GYN She is not sexually active.    Cervical Cancer Screening (21-65):   Last Pap:   May 2022 Results were: no abnormalities /neg HPV DNA   Breast Cancer Screening (Age 49-74):  There is no FH of breast cancer. There is no FH of ovarian cancer. BRCA screening Not Indicated.  Last Mammogram: 10/2020 The patient does want a mammogram this year.    Colon Cancer Screening:  Age 5-75 yo - benefits outweigh the risk. Adults 1-85 yo who have never been screened benefit.  Benefits: 134000 people in 2016 will be diagnosed and 49,000 will die - early detection helps [place]: Complications 2/2 to colonoscopy High Risk (Colonoscopy): genetic disorder (Lynch syndrome or familial adenomatous polyposis), personal hx of IBD, previous adenomatous polyp, or previous colorectal cancer, FamHx start 10 years before the age at diagnosis, increased in males and black race  Options:  FIT - looks for hemoglobin (blood in the stool) - specific and fairly sensitive  - must be done annually Cologuard - looks for DNA and blood - more sensitive - therefore can have more false positives, every 3 years Colonoscopy - every 10 years if normal - sedation, bowl prep, must have someone drive you  Shared decision making and the patient had decided to do colonoscopy 2019 - due in 10 years.   Social History   Tobacco Use  Smoking Status Never  Smokeless Tobacco Never    Lung Cancer Screening (Ages 26-94): not applicable   Weight Wt Readings from Last 3 Encounters:  09/06/21 227 lb 2 oz (103 kg)  06/02/21 228 lb (103.4 kg)  05/24/21 228 lb 4 oz (103.5 kg)   Patient has high BMI  BMI Readings from Last 1 Encounters:  09/06/21 35.57 kg/m     Chronic disease screening Blood pressure monitoring:  BP Readings from Last 3 Encounters:  09/06/21 106/62  05/24/21 100/60  02/10/21 118/72    Lipid Monitoring: Indication for screening: age >46, obesity, diabetes, family hx, CV risk factors.  Lipid screening: Yes  Lab Results  Component Value Date   CHOL 139 05/24/2021   HDL 63.30 05/24/2021   LDLCALC 56 05/24/2021   TRIG 97.0 05/24/2021   CHOLHDL 2 05/24/2021     Diabetes Screening: age >91, overweight, family hx, PCOS, hx of gestational diabetes, at risk ethnicity Diabetes Screening screening: Not Indicated  No results found for: "HGBA1C"   Past Medical  History:  Diagnosis Date   Depression    GERD (gastroesophageal reflux disease)    Headache    Hiatal hernia    Hyperlipemia    Hypertension    Multiple gastric ulcers    Myocardial infarction [facility])    STEMI (ST elevation myocardial infarction) ([place]) 04/10/2018    Past Surgical History:  Procedure Laterality Date   CHOLECYSTECTOMY  2010   COLONOSCOPY W/ ENDOSCOPIC [place]  10/17/2017   ESOPHAGOGASTRODUODENOSCOPY (EGD) WITH PROPOFOL N/A 04/28/2020   Procedure: ESOPHAGOGASTRODUODENOSCOPY (EGD) WITH PROPOFOL;  Surgeon: Lin Landsman, MD;  Location: [facility];  Service:  Gastroenterology;  Laterality: N/A;    Prior to Admission medications   Medication Sig Start Date End Date Taking? Authorizing Provider  apixaban (ELIQUIS) 5 MG TABS tablet Take 1 tablet by mouth 2 (two) times daily. cardio 05/07/18  Yes [provider]  atorvastatin (LIPITOR) 80 MG tablet Take 1 tablet (80 mg total) by mouth daily. For cholesterol. 05/24/21  Yes Lesleigh Noe, MD  escitalopram (LEXAPRO) 5 MG tablet TAKE 1 TABLET BY MOUTH EVERY DAY 08/10/21  Yes Lesleigh Noe, MD  furosemide (LASIX) 20 MG tablet Take 20 mg by mouth as needed. cardio 04/12/18 05/05/22 Yes [provider]  lisinopril (ZESTRIL) 5 MG tablet Take 1 tablet by mouth daily. cardio 05/02/18  Yes [provider]  metoprolol succinate (TOPROL-XL) 25 MG 24 hr tablet Take 12.5 mg by mouth daily. cardio 05/07/18 05/05/22 Yes [provider]  nitroGLYCERIN (NITROSTAT) 0.4 MG SL tablet Place under the tongue. cardio 04/12/18  Yes [provider]  pantoprazole (PROTONIX) 20 MG tablet TAKE 1 TABLET (20 MG TOTAL) BY MOUTH DAILY. FOR HEARTBURN 08/11/21  Yes Lesleigh Noe, MD  traMADol (ULTRAM) 50 MG tablet Take 1 tablet (50 mg total) by mouth every 12 (twelve) hours as needed. 02/16/21  Yes Aundra Dubin, PA-C  amoxicillin (AMOXIL) 500 MG capsule Take 500 mg by mouth every 8 (eight) hours. Patient not taking: Reported on 06/02/2021 05/19/21   [provider]  cyclobenzaprine (FLEXERIL) 5 MG tablet Take 1 tablet (5 mg total) by mouth 3 (three) times daily as needed for muscle spasms. Patient not taking: Reported on 06/02/2021 06/17/20   Lesleigh Noe, MD    Allergies  Allergen Reactions   Gluten Meal     Other reaction(s): GI Reaction    Gynecologic History: No LMP recorded. Patient is postmenopausal.  Obstetric History: No obstetric history on file.  Social History   Socioeconomic History   Marital status: Married    Spouse name: [name]   Number of children: 1   Years of  education: some college   Highest education level: Not on file  Occupational History   Not on file  Tobacco Use   Smoking status: Never   Smokeless tobacco: Never  Vaping Use   Vaping Use: Never used  Substance and Sexual Activity   Alcohol use: No   Drug use: No   Sexual activity: Not Currently  Other Topics Concern   Not on file  Social History Narrative   01/16/20   From: [place] originally, moved 2021 to be near daughter   Living: with husband, [name] [phone])   Work: retired Scientist, research (physical sciences)      Family: daughter [name]      Enjoys: reading, games, walking the park       Exercise: walking 2-3 times a week   Diet: tires to do heart healthy diet  Safety   Seat belts: Yes    Guns: Yes  and secure   Safe in relationships: Yes    Social Determinants of Health   Financial Resource Strain: Low Risk  (06/02/2021)   Overall Financial Resource Strain (CARDIA)    Difficulty of Paying Living Expenses: Not hard at all  Food Insecurity: No Food Insecurity (06/02/2021)   Hunger Vital Sign    Worried About Running Out of Food in the Last Year: Never true    Ran Out of Food in the Last Year: Never true  Transportation Needs: No Transportation Needs (06/02/2021)   PRAPARE - Hydrologist (Medical): No    Lack of Transportation (Non-Medical): No  Physical Activity: Insufficiently Active (06/02/2021)   Exercise Vital Sign    Days of Exercise per Week: 3 days    Minutes of Exercise per Session: 30 min  Stress: No Stress Concern Present (06/02/2021)   [place]    Feeling of Stress : Only a little  Social Connections: Moderately Isolated (06/02/2021)   Social Connection and Isolation Panel [NHANES]    Frequency of Communication with Friends and Family: More than three times a week    Frequency of Social Gatherings with Friends and Family: Once a week    Attends Religious Services: Never     Marine scientist or Organizations: No    Attends Archivist Meetings: Never    Marital Status: Married  Human resources officer Violence: Not At Risk (06/02/2021)   Humiliation, Afraid, Rape, and Kick questionnaire    Fear of Current or Ex-Partner: No    Emotionally Abused: No    Physically Abused: No    Sexually Abused: No    Family History  Problem Relation Age of Onset   Dementia Mother    Diabetes Mother    Dementia Father    Diabetes Father    Heart disease Father    Cancer Sister        unknown type   Diabetes Sister    Hyperlipidemia Sister    Cervical cancer Sister    Diabetes Brother     Review of Systems  Constitutional:  Negative for chills and fever.  HENT:  Negative for congestion and sore throat.   Eyes:  Negative for blurred vision and double vision.  Respiratory:  Negative for shortness of breath.   Cardiovascular:  Negative for chest pain.  Gastrointestinal:  Negative for heartburn, nausea and vomiting.  Genitourinary: Negative.   Musculoskeletal: Negative.  Negative for myalgias.  Skin:  Negative for rash.  Neurological:  Negative for dizziness and headaches.  Endo/Heme/Allergies:  Does not bruise/bleed easily.  Psychiatric/Behavioral:  Negative for depression. The patient is not nervous/anxious.      Physical Exam BP 106/62   Pulse 93   Temp 97.9 F (36.6 C)   Resp 16   Ht $R'5\' 7"'Pm$  (1.702 m)   Wt 227 lb 2 oz (103 kg)   SpO2 97%   BMI 35.57 kg/m    BP Readings from Last 3 Encounters:  09/06/21 106/62  05/24/21 100/60  02/10/21 118/72      Physical Exam Constitutional:      General: She is not in acute distress.    Appearance: She is well-developed. She is not diaphoretic.  HENT:     Head: Normocephalic and atraumatic.     Right Ear: External ear normal.     Left Ear: External ear  normal.     Nose: Nose normal.  Eyes:     General: No scleral icterus.    Extraocular Movements: Extraocular movements intact.      Conjunctiva/sclera: Conjunctivae normal.  Cardiovascular:     Rate and Rhythm: Normal rate and regular rhythm.     Heart sounds: No murmur heard. Pulmonary:     Effort: Pulmonary effort is normal. No respiratory distress.     Breath sounds: Normal breath sounds. No wheezing.  Abdominal:     General: Bowel sounds are normal. There is no distension.     Palpations: Abdomen is soft. There is no mass.     Tenderness: There is no abdominal tenderness. There is no guarding or rebound.  Musculoskeletal:        General: Normal range of motion.     Cervical back: Neck supple.  Lymphadenopathy:     Cervical: No cervical adenopathy.  Skin:    General: Skin is warm and dry.     Capillary Refill: Capillary refill takes less than 2 seconds.  Neurological:     Mental Status: She is alert and oriented to person, place, and time.     Deep Tendon Reflexes: Reflexes normal.  Psychiatric:        Mood and Affect: Mood normal.        Behavior: Behavior normal.     Results:  PHQ-9:  [place] Office Visit from 09/06/2021 in [facility] [place]  PHQ-9 Total Score 6         Assessment: 66 y.o. No obstetric history on file. female here for routine annual physical examination.  Plan: Problem List Items Addressed This Visit       Other   Major depressive disorder    Worsening symptoms. Increase lexapro 5>10 mg.  Update in 4 to 6 weeks if no improvement.      Relevant Medications   escitalopram (LEXAPRO) 10 MG tablet   Other Visit Diagnoses     Annual physical exam    -  Primary   Encounter for screening mammogram for malignant neoplasm of breast       Relevant Orders   MM 3D SCREEN BREAST BILATERAL       Screening: -- Blood pressure screen normal -- cholesterol screening: not due for screening -- Weight screening: overweight: continue to monitor -- Diabetes Screening: not due for screening -- Nutrition: Encouraged healthy diet  The ASCVD Risk score (Arnett  DK, et al., 2019) failed to calculate for the following reasons:   The patient has a prior MI or stroke diagnosis  -- Statin therapy for Age 31-75 with CVD risk >7.5%  Psych -- Depression screening (PHQ-9):  [place] Visit from 09/06/2021 in [facility] at [facility]  PHQ-9 Total Score 6        Safety -- tobacco screening: not using -- alcohol screening:  low-risk usage. -- no evidence of domestic violence or intimate partner violence.   Cancer Screening -- pap smear not collected per ASCCP guidelines -- family history of breast cancer screening: done. not at high risk. -- Mammogram -  ordered -- Colon cancer (age 10+)--  up to date  Immunizations Immunization History  Administered Date(s) Administered   Fluad Quad(high Dose 65+) 10/22/2018   Influenza-Unspecified 09/28/2020   PFIZER(Purple Top)SARS-COV-2 Vaccination 02/28/2019, 03/23/2019, 10/22/2019, 04/30/2020   Pfizer Covid-19 Vaccine Bivalent Booster 74yrs & up 06/28/2021   Pneumococcal Conjugate-13 04/13/2018   Tdap 06/23/2010    -- flu vaccine up  to date -- TDAP q10 years not up to date - encouraged getting -- Shingles (age >52) not up to date - she is in the process of getting this -- PPSV-23 (19-64 with chronic disease or smoking) unknown, record requested -- PCV-13 (age >47) - one dose followed by PPSV-23 1 year later up to date -- Covid-19 Vaccine up to date   Encouraged healthy diet and exercise. Encouraged regular vision and dental care.    Lesleigh Noe, MD

## 2021-09-18 ENCOUNTER — Other Ambulatory Visit: Payer: Self-pay | Admitting: Family Medicine

## 2021-09-18 DIAGNOSIS — E782 Mixed hyperlipidemia: Secondary | ICD-10-CM

## 2021-09-23 DIAGNOSIS — H5213 Myopia, bilateral: Secondary | ICD-10-CM | POA: Diagnosis not present

## 2021-09-23 DIAGNOSIS — H524 Presbyopia: Secondary | ICD-10-CM | POA: Diagnosis not present

## 2021-09-24 ENCOUNTER — Telehealth: Payer: Self-pay | Admitting: *Deleted

## 2021-09-24 ENCOUNTER — Telehealth: Payer: Self-pay | Admitting: Family Medicine

## 2021-09-24 NOTE — Patient Outreach (Signed)
  Care Coordination   09/24/2021 Name: Joanne Clark MRN: [id number] DOB: [birth year]   Care Coordination Outreach Attempts:  An unsuccessful telephone outreach was attempted today to offer the patient information about available care coordination services as a benefit of their health plan.   Follow Up Plan:  Additional outreach attempts will be made to offer the patient care coordination information and services.   Encounter Outcome:  Pt. Request to Call Back  Care Coordination Interventions Activated:  No   Care Coordination Interventions:  No, not indicated    Emelia Loron RN, BSN [place] [phone] [name]

## 2021-09-24 NOTE — Telephone Encounter (Signed)
Pt's husbands called & stated they tried to pick up pt's meds, apixaban (ELIQUIS) 5 MG TABS tablet, & the pharmacy ([facility]) stated the cost of the 3 month supply was about $700 & they can't afford it. Pt's husband stated they've gotten the meds last months for only $40 but this month, its a way higher price. Pt is requesting advice on cheaper meds, if possible or samples? Call back # is [phone].

## 2021-09-27 ENCOUNTER — Telehealth: Payer: Self-pay

## 2021-09-27 NOTE — Chronic Care Management (AMB) (Signed)
Contacted the patient with information needed to apply for assistance with her Eliquis.Forms completed and sent for review, the patient will bring financials to office this week and sign form.  [name], CPP notified  Joanne Clark, [place]  [phone]

## 2021-09-27 NOTE — Telephone Encounter (Signed)
Eliquis paperwork printed and placed in front office - patient folders. Pt will come sign and bring OOP rx costs.

## 2021-10-11 ENCOUNTER — Ambulatory Visit: Payer: Medicare Other | Admitting: Podiatry

## 2021-11-09 ENCOUNTER — Other Ambulatory Visit: Payer: Self-pay

## 2021-11-09 MED ORDER — PANTOPRAZOLE SODIUM 20 MG PO TBEC: 20.0000 mg | DELAYED_RELEASE_TABLET | Freq: Every day | ORAL | 1 refills | Status: DC

## 2021-11-09 MED ORDER — LISINOPRIL 5 MG PO TABS: 5.0000 mg | ORAL_TABLET | Freq: Every day | ORAL | 0 refills | Status: DC

## 2021-11-09 NOTE — Addendum Note (Signed)
Addended by: Loreen Freud on: 11/09/2021 12:26 PM   Modules accepted: Orders

## 2021-12-03 ENCOUNTER — Ambulatory Visit (INDEPENDENT_AMBULATORY_CARE_PROVIDER_SITE_OTHER): Payer: Medicare Other | Admitting: Nurse Practitioner

## 2021-12-03 ENCOUNTER — Telehealth: Payer: Self-pay

## 2021-12-03 VITALS — BP 100/62 | HR 78 | Temp 96.9°F | Resp 14 | Ht 67.0 in | Wt 229.4 lb

## 2021-12-03 DIAGNOSIS — M79605 Pain in left leg: Secondary | ICD-10-CM

## 2021-12-03 DIAGNOSIS — R1319 Other dysphagia: Secondary | ICD-10-CM | POA: Diagnosis not present

## 2021-12-03 DIAGNOSIS — R109 Unspecified abdominal pain: Secondary | ICD-10-CM | POA: Diagnosis not present

## 2021-12-03 DIAGNOSIS — M25562 Pain in left knee: Secondary | ICD-10-CM | POA: Diagnosis not present

## 2021-12-03 DIAGNOSIS — M25561 Pain in right knee: Secondary | ICD-10-CM | POA: Insufficient documentation

## 2021-12-03 DIAGNOSIS — M79604 Pain in right leg: Secondary | ICD-10-CM | POA: Diagnosis not present

## 2021-12-03 DIAGNOSIS — R21 Rash and other nonspecific skin eruption: Secondary | ICD-10-CM | POA: Diagnosis not present

## 2021-12-03 LAB — CBC
HCT: 41.7 % (ref 36.0–46.0)
Hemoglobin: 13.9 g/dL (ref 12.0–15.0)
MCHC: 33.4 g/dL (ref 30.0–36.0)
MCV: 90.2 fl (ref 78.0–100.0)
Platelets: 247 10*3/uL (ref 150.0–400.0)
RBC: 4.62 Mil/uL (ref 3.87–5.11)
RDW: 14.1 % (ref 11.5–15.5)
WBC: 6.5 10*3/uL (ref 4.0–10.5)

## 2021-12-03 LAB — COMPREHENSIVE METABOLIC PANEL
ALT: 23 U/L (ref 0–35)
AST: 18 U/L (ref 0–37)
Albumin: 4.2 g/dL (ref 3.5–5.2)
Alkaline Phosphatase: 98 U/L (ref 39–117)
BUN: 18 mg/dL (ref 6–23)
CO2: 30 mEq/L (ref 19–32)
Calcium: 9.6 mg/dL (ref 8.4–10.5)
Chloride: 106 mEq/L (ref 96–112)
Creatinine, Ser: 0.76 mg/dL (ref 0.40–1.20)
GFR: 80.45 mL/min (ref >60.00)
Glucose, Bld: 117 mg/dL — ABNORMAL HIGH (ref 70–99)
Potassium: 4.2 mEq/L (ref 3.5–5.1)
Sodium: 141 mEq/L (ref 135–145)
Total Bilirubin: 0.6 mg/dL (ref 0.2–1.2)
Total Protein: 7.1 g/dL (ref 6.0–8.3)

## 2021-12-03 LAB — LIPASE: Lipase: 17 U/L (ref 11.0–59.0)

## 2021-12-03 LAB — SEDIMENTATION RATE: Sed Rate: 19 mm/hr (ref 0–30)

## 2021-12-03 MED ORDER — LEVOCETIRIZINE DIHYDROCHLORIDE 5 MG PO TABS: 5.0000 mg | ORAL_TABLET | Freq: Every evening | ORAL | 1 refills | Status: DC

## 2021-12-03 NOTE — Telephone Encounter (Signed)
Would like help with getting eliquis cost assistance.

## 2021-12-03 NOTE — Telephone Encounter (Signed)
Spoke with patient, she signed Eliquis application while she was in office today. We still need a copy of her household out-of-pocket RX costs for 2023, patient will bring a copy from her pharmacy at her earliest convenience.

## 2021-12-03 NOTE — Progress Notes (Signed)
Acute Office Visit  Subjective:     Patient ID: Joanne Clark, female    DOB: 1955-10-25, 66 y.o.   MRN: [id number]  Chief Complaint  Patient presents with   Leg Pain    Both legs and knee pain- feet "pop," x few weeks.   Referral    Would like referral back to ENT-feels like her tongue is swollen and its causing some trouble swallowing, last time had nodules in the neck they found in the past   Rash    Comes and goes across her belly but not present right now and feels like maybe this is her pancreas and would like things checked for that     Patient is in today for multiple complaints  Leg pain: states that has been happening over the past few months. She has concrete floors, new puppies that she will take out and play and run. States that her knees and foot pain. States that she has had an xray on the right foot and had bone spurs.   No lupus or RA and family history.  Tongue swelling: States that her tongue feels swollen. States that when she went to bed it felt full in the mouth. States that as of late she has had trouble swallowing and couging. States this has been a couple days  Rash: States that it has been intermittent for the pas few months. States that it will come across her middle. States that it will show up. States that it is itchy. States that she is the only one that gets it. States that she has not changed soaps and stuff States that it happened this mornig and she did have mcdonals. States that she has not had the same breafast and it starts. Has not tried anything over the counter   Review of Systems  Constitutional:  Negative for chills and fever.  Respiratory:  Negative for shortness of breath.   Cardiovascular:  Negative for chest pain.  Gastrointestinal:  Positive for abdominal pain (resloved). Negative for nausea and vomiting.  Genitourinary:  Negative for dysuria and hematuria.  Skin:  Positive for rash.  Neurological:  Negative for headaches.         Objective:    BP 100/62   Pulse 78   Temp (!) 96.9 F (36.1 C)   Resp 14   Ht _0  (1.702 m)   Wt 229 lb 6 oz (104 kg)   SpO2 97%   BMI 35.93 kg/m  BP Readings from Last 3 Encounters:  12/03/21 100/62  09/06/21 106/62  05/24/21 100/60   Wt Readings from Last 3 Encounters:  12/03/21 229 lb 6 oz (104 kg)  09/06/21 227 lb 2 oz (103 kg)  06/02/21 228 lb (103.4 kg)      Physical Exam Vitals and nursing note reviewed.  Constitutional:      Appearance: Normal appearance.  HENT:     Mouth/Throat:     Mouth: Mucous membranes are moist.     Pharynx: Oropharynx is clear. No posterior oropharyngeal erythema.  Cardiovascular:     Rate and Rhythm: Normal rate and regular rhythm.     Pulses:          Dorsalis pedis pulses are 2+ on the right side and 2+ on the left side.       Posterior tibial pulses are 2+ on the right side and 2+ on the left side.     Heart sounds: Normal heart sounds.  Pulmonary:  Breath sounds: Normal breath sounds.  Abdominal:     General: Bowel sounds are normal. There is no distension.     Palpations: There is no mass.     Tenderness: There is abdominal tenderness.     Hernia: No hernia is present.  Musculoskeletal:     Right lower leg: No edema.     Left lower leg: No edema.  Lymphadenopathy:     Cervical: No cervical adenopathy.  Neurological:     Mental Status: She is alert.     Comments: Lower extremity strength 5/5     No results found for any visits on 12/03/21.      Assessment & Plan:   Problem List Items Addressed This Visit       Digestive   Other dysphagia    Patient feels that her tongue is swelling.  Oropharynx open on physical exam.  We will start patient on second-generation antihistamine did offer some prednisone and patient politely declined.  She knows that she does get worse to be reevaluated the weekend.  Pending blood work.  Patient to call and get an appoint with her ENT provider         Musculoskeletal and Integument   Rash    Ambiguous in nature and non discrete.  We will put patient on second-generation antihistamine      Relevant Medications   levocetirizine (XYZAL) 5 MG tablet   Other Relevant Orders   Sedimentation rate     Other   Pain in both lower extremities - Primary    2/2 likely related to activity on concrete floors.  Continue to get over-the-counter analgesics as needed, avoid NSAIDs.  Pending ANA and autoimmune work-up      Relevant Orders   ANA   Rheumatoid factor   Sedimentation rate   Acute pain of both knees    Multifactorial.  Patient is also hurting in legs ankles and feet will order autoimmune lab work and ESR.  Could be coming from lots of concrete flooring and jogging on concrete with her new puppies.  Continues an over-the-counter analgesics as needed for pain avoid NSAIDs due to anticoagulation      Relevant Orders   ANA   Rheumatoid factor   Sedimentation rate   Abdominal discomfort    Ambiguous in nature.  Patient also have a abdominal rash that is intermittent.  Patient would like her pancreas enzymes checked labs ordered, pending result.      Relevant Orders   CBC   Comprehensive metabolic panel   Lipase    Meds ordered this encounter  Medications   levocetirizine (XYZAL) 5 MG tablet    Sig: Take 1 tablet (5 mg total) by mouth every evening.    Dispense:  30 tablet    Refill:  1    Order Specific Question:   Supervising Provider    Answer:   Loura Pardon A [[id number]]    Return for As scheduled for TOC.  Romilda Garret, NP

## 2021-12-03 NOTE — Telephone Encounter (Signed)
Patient brought OOP Rx costs. Faxed along with completed application to [employer].

## 2021-12-03 NOTE — Assessment & Plan Note (Signed)
2/2 likely related to activity on concrete floors.  Continue to get over-the-counter analgesics as needed, avoid NSAIDs.  Pending ANA and autoimmune work-up

## 2021-12-03 NOTE — Assessment & Plan Note (Signed)
Multifactorial.  Patient is also hurting in legs ankles and feet will order autoimmune lab work and ESR.  Could be coming from lots of concrete flooring and jogging on concrete with her new puppies.  Continues an over-the-counter analgesics as needed for pain avoid NSAIDs due to anticoagulation

## 2021-12-03 NOTE — Assessment & Plan Note (Signed)
Patient feels that her tongue is swelling.  Oropharynx open on physical exam.  We will start patient on second-generation antihistamine did offer some prednisone and patient politely declined.  She knows that she does get worse to be reevaluated the weekend.  Pending blood work.  Patient to call and get an appoint with her ENT provider

## 2021-12-03 NOTE — Assessment & Plan Note (Signed)
Ambiguous in nature.  Patient also have a abdominal rash that is intermittent.  Patient would like her pancreas enzymes checked labs ordered, pending result.

## 2021-12-03 NOTE — Assessment & Plan Note (Signed)
Ambiguous in nature and non discrete.  We will put patient on second-generation antihistamine

## 2021-12-03 NOTE — Patient Instructions (Signed)
Nice to see you today I will be in touch with the labs I sent in an antihistamine to the pharmacy Follow up as scheduled

## 2021-12-05 LAB — ANTI-NUCLEAR AB-TITER (ANA TITER): ANA Titer 1: 1:40 {titer} — ABNORMAL HIGH

## 2021-12-05 LAB — ANA: Anti Nuclear Antibody (ANA): POSITIVE — AB

## 2021-12-07 ENCOUNTER — Other Ambulatory Visit: Payer: Self-pay

## 2021-12-07 DIAGNOSIS — F329 Major depressive disorder, single episode, unspecified: Secondary | ICD-10-CM

## 2021-12-07 MED ORDER — ESCITALOPRAM OXALATE 10 MG PO TABS: 10.0000 mg | ORAL_TABLET | Freq: Every day | ORAL | 0 refills | Status: DC

## 2021-12-07 NOTE — Telephone Encounter (Signed)
Has TOC scheduled on 03/09/22

## 2021-12-08 ENCOUNTER — Telehealth: Payer: Self-pay | Admitting: Nurse Practitioner

## 2021-12-08 DIAGNOSIS — R768 Other specified abnormal immunological findings in serum: Secondary | ICD-10-CM

## 2021-12-08 NOTE — Telephone Encounter (Signed)
-----   Message from [place] sent at 12/07/2021  2:45 PM EST ----- Patient advised. Patient would like to go to [facility] location for rheumatologist.

## 2021-12-20 ENCOUNTER — Other Ambulatory Visit: Payer: Self-pay

## 2021-12-20 DIAGNOSIS — E782 Mixed hyperlipidemia: Secondary | ICD-10-CM

## 2021-12-20 MED ORDER — ATORVASTATIN CALCIUM 80 MG PO TABS: 80.0000 mg | ORAL_TABLET | Freq: Every day | ORAL | 0 refills | Status: DC

## 2021-12-20 MED ORDER — ATORVASTATIN CALCIUM 80 MG PO TABS: 80.0000 mg | ORAL_TABLET | Freq: Every day | ORAL | 0 refills | Status: AC

## 2021-12-25 ENCOUNTER — Other Ambulatory Visit: Payer: Self-pay | Admitting: Nurse Practitioner

## 2021-12-25 DIAGNOSIS — R21 Rash and other nonspecific skin eruption: Secondary | ICD-10-CM

## 2021-12-31 ENCOUNTER — Other Ambulatory Visit: Payer: Self-pay | Admitting: Otolaryngology

## 2021-12-31 DIAGNOSIS — J351 Hypertrophy of tonsils: Secondary | ICD-10-CM | POA: Diagnosis not present

## 2021-12-31 DIAGNOSIS — K148 Other diseases of tongue: Secondary | ICD-10-CM

## 2021-12-31 DIAGNOSIS — R1314 Dysphagia, pharyngoesophageal phase: Secondary | ICD-10-CM | POA: Diagnosis not present

## 2022-01-17 HISTORY — PX: OTHER SURGICAL HISTORY: SHX169

## 2022-01-20 ENCOUNTER — Ambulatory Visit
Admission: RE | Admit: 2022-01-20 | Discharge: 2022-01-20 | Disposition: A | Discharge status: Home / Self Care | Payer: Medicare Other | Source: Ambulatory Visit | Attending: Otolaryngology | Admitting: Otolaryngology

## 2022-01-20 DIAGNOSIS — R220 Localized swelling, mass and lump, head: Secondary | ICD-10-CM | POA: Diagnosis not present

## 2022-01-20 DIAGNOSIS — R0989 Other specified symptoms and signs involving the circulatory and respiratory systems: Secondary | ICD-10-CM | POA: Diagnosis not present

## 2022-01-20 DIAGNOSIS — K148 Other diseases of tongue: Secondary | ICD-10-CM

## 2022-01-20 DIAGNOSIS — R221 Localized swelling, mass and lump, neck: Secondary | ICD-10-CM | POA: Diagnosis not present

## 2022-01-20 DIAGNOSIS — M47812 Spondylosis without myelopathy or radiculopathy, cervical region: Secondary | ICD-10-CM | POA: Diagnosis not present

## 2022-01-20 MED ORDER — IOPAMIDOL (ISOVUE-300) INJECTION 61%
75.0000 mL | Freq: Once | INTRAVENOUS | Status: AC | PRN
  Administered 2022-01-20: 75 mL via INTRAVENOUS

## 2022-02-02 ENCOUNTER — Other Ambulatory Visit: Payer: Self-pay | Admitting: Nurse Practitioner

## 2022-02-02 DIAGNOSIS — R21 Rash and other nonspecific skin eruption: Secondary | ICD-10-CM

## 2022-02-04 DIAGNOSIS — J351 Hypertrophy of tonsils: Secondary | ICD-10-CM | POA: Diagnosis not present

## 2022-02-06 ENCOUNTER — Other Ambulatory Visit: Payer: Self-pay | Admitting: Family

## 2022-03-01 ENCOUNTER — Encounter: Payer: Self-pay | Admitting: Internal Medicine

## 2022-03-01 ENCOUNTER — Ambulatory Visit (INDEPENDENT_AMBULATORY_CARE_PROVIDER_SITE_OTHER): Payer: Medicare Other | Admitting: Internal Medicine

## 2022-03-01 VITALS — BP 110/70 | HR 86 | Temp 97.6°F | Ht 67.0 in | Wt 230.0 lb

## 2022-03-01 DIAGNOSIS — K047 Periapical abscess without sinus: Secondary | ICD-10-CM

## 2022-03-01 MED ORDER — AMOXICILLIN 500 MG PO TABS: 1000.0000 mg | ORAL_TABLET | Freq: Two times a day (BID) | ORAL | 1 refills | Status: DC

## 2022-03-01 NOTE — Progress Notes (Signed)
Subjective:    Patient ID: Joanne Clark, female    DOB: February 20, 1955, 67 y.o.   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 cardio     atorvastatin (LIPITOR) 80 MG tablet Take 1 tablet (80 mg total) by mouth daily. For cholesterol. Will need to be seen in office for more refills. 90 tablet 0   cyclobenzaprine (FLEXERIL) 5 MG tablet Take 1 tablet (5 mg total) by mouth 3 (three) times daily as needed for muscle spasms. 30 tablet 1   furosemide (LASIX) 20 MG tablet Take 20 mg by mouth as needed. cardio     lisinopril (ZESTRIL) 5 MG tablet Take 1 tablet (5 mg total) by mouth daily. cardio 90 tablet 0   metoprolol succinate (TOPROL-XL) 25 MG 24 hr tablet Take 12.5 mg by mouth daily. cardio     nitroGLYCERIN (NITROSTAT) 0.4 MG SL tablet Place under the tongue. cardio     pantoprazole (PROTONIX) 20 MG tablet Take 1 tablet (20 mg total) by mouth daily. For heartburn 90 tablet 1   No current facility-administered medications on file prior to visit.    Allergies  Allergen Reactions   Gluten Meal     Other reaction(s): GI Reaction    Past Medical History:  Diagnosis Date   Depression    GERD (gastroesophageal reflux disease)    Headache    Hiatal hernia    Hyperlipemia    Hypertension    Multiple gastric ulcers    Myocardial infarction [facility])    STEMI (ST elevation myocardial infarction) ([place]) 04/10/2018    Past Surgical History:  Procedure Laterality Date   CHOLECYSTECTOMY  2010   COLONOSCOPY W/ ENDOSCOPIC [place]  10/17/2017   ESOPHAGOGASTRODUODENOSCOPY  (EGD) WITH PROPOFOL N/A 04/28/2020   Procedure: ESOPHAGOGASTRODUODENOSCOPY (EGD) WITH PROPOFOL;  Surgeon: Lin Landsman, MD;  Location: [facility];  Service: Gastroenterology;  Laterality: N/A;    Family History  Problem Relation Age of Onset   Dementia Mother    Diabetes Mother    Dementia Father    Diabetes Father    Heart disease Father    Cancer Sister        unknown type   Diabetes Sister    Hyperlipidemia Sister    Cervical cancer Sister    Diabetes Brother     Social History   Socioeconomic History   Marital status: Married    Spouse name: [name]   Number of children: 1   Years of education: some college   Highest education level: Not on file  Occupational History   Not on file  Tobacco Use   Smoking status: Never   Smokeless tobacco: Never  Vaping Use   Vaping Use: Never used  Substance and Sexual Activity   Alcohol use: No   Drug use: No   Sexual activity: Not Currently  Other Topics Concern   Not on file  Social History Narrative   01/16/20   From: [place] originally, moved 2021 to be near daughter   Living: with husband,  [name] [phone])   Work: retired Scientist, research (physical sciences)      Family: daughter [name]      Enjoys: reading, games, walking the park       Exercise: walking 2-3 times a week   Diet: tires to do heart healthy diet      Safety   Seat belts: Yes    Guns: Yes  and secure   Safe in relationships: Yes    Social Determinants of Health   Financial Resource Strain: Low Risk  (06/02/2021)   Overall Financial Resource Strain (CARDIA)    Difficulty of Paying Living Expenses: Not hard at all  Food Insecurity: No Food Insecurity (06/02/2021)   Hunger Vital Sign    Worried About Running Out of Food in the Last Year: Never true    [place] in the Last Year: Never true  Transportation Needs: No Transportation Needs (06/02/2021)   PRAPARE - Hydrologist (Medical): No    Lack of Transportation (Non-Medical): No   Physical Activity: Insufficiently Active (06/02/2021)   Exercise Vital Sign    Days of Exercise per Week: 3 days    Minutes of Exercise per Session: 30 min  Stress: No Stress Concern Present (06/02/2021)   [place]    Feeling of Stress : Only a little  Social Connections: Moderately Isolated (06/02/2021)   Social Connection and Isolation Panel [NHANES]    Frequency of Communication with Friends and Family: More than three times a week    Frequency of Social Gatherings with Friends and Family: Once a week    Attends Religious Services: Never    Marine scientist or Organizations: No    Attends Archivist Meetings: Never    Marital Status: Married  Human resources officer Violence: Not At Risk (06/02/2021)   Humiliation, Afraid, Rape, and Kick questionnaire    Fear of Current or Ex-Partner: No    Emotionally Abused: No    Physically Abused: No    Sexually Abused: No   Review of Systems ?low grade fever last night Does have pain with chewing on left side Slight discomfort in TMJ with opening mouth fully    Objective:   Physical Exam Constitutional:      Appearance: Normal appearance.  HENT:     Head:     Comments: Mild tenderness at left TMJ    Right Ear: Tympanic membrane and ear canal normal.     Left Ear: Tympanic membrane and ear canal normal.     Mouth/Throat:     Comments: Slight tenderness at base of upper left molar Slight redness mid left cheek--but no ulcer Musculoskeletal:     Cervical back: Neck supple.  Lymphadenopathy:     Cervical: No cervical adenopathy.  Neurological:     Mental Status: She is alert.            Assessment & Plan:

## 2022-03-01 NOTE — Assessment & Plan Note (Signed)
Likely the cause of her pain--though might just be exacerbation of TMJ pain Will treat with amoxil 10102m bid x 7 days She will follow up with dentist

## 2022-03-08 DIAGNOSIS — M272 Inflammatory conditions of jaws: Secondary | ICD-10-CM | POA: Diagnosis not present

## 2022-03-09 ENCOUNTER — Encounter: Payer: Medicare Other | Admitting: Nurse Practitioner

## 2022-03-10 ENCOUNTER — Other Ambulatory Visit: Payer: Self-pay | Admitting: Nurse Practitioner

## 2022-03-10 DIAGNOSIS — F329 Major depressive disorder, single episode, unspecified: Secondary | ICD-10-CM

## 2022-04-03 ENCOUNTER — Other Ambulatory Visit: Payer: Self-pay | Admitting: Nurse Practitioner

## 2022-04-03 DIAGNOSIS — R21 Rash and other nonspecific skin eruption: Secondary | ICD-10-CM

## 2022-04-12 IMAGING — CT CT ANGIO NECK
1 of 13 series · 4 of 33 positions shown · IV contrast (APPLIED)
Comparison: No pertinent prior exams available for comparison.

CLINICAL DATA: Dizziness, nonspecific. Additional history provided:
Patient reports increased dizziness and loss of balance, patient
reports fall yesterday, now left-sided pain.

EXAM:
CT ANGIOGRAPHY HEAD AND NECK
TECHNIQUE: Multidetector CT imaging of the head and neck was performed using
the standard protocol during bolus administration of intravenous
contrast. Multiplanar CT image reconstructions and MIPs were
obtained to evaluate the vascular anatomy. Carotid stenosis
measurements (when applicable) are obtained utilizing NASCET
criteria, using the distal internal carotid diameter as the
denominator.
CONTRAST:  75mL OMNIPAQUE IOHEXOL 350 MG/ML SOLN

[Series 513: cta head neck thins · axial · 0.40mm/px · z∈[-282,-72]mm · 4 of 701 slices shown]
[im 141/701  soft-tissue]
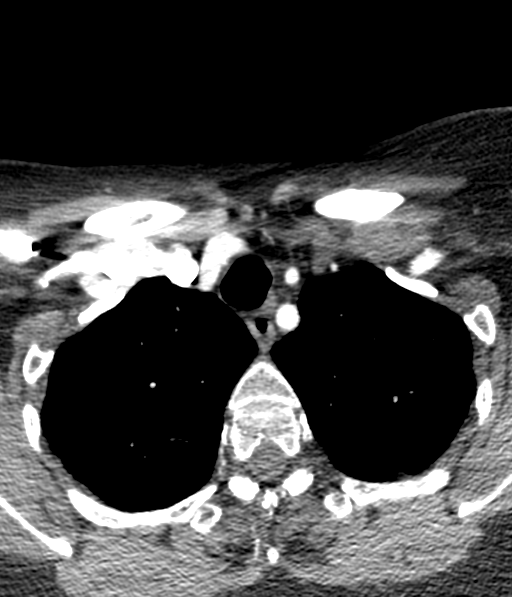
[im 281/701  bone]
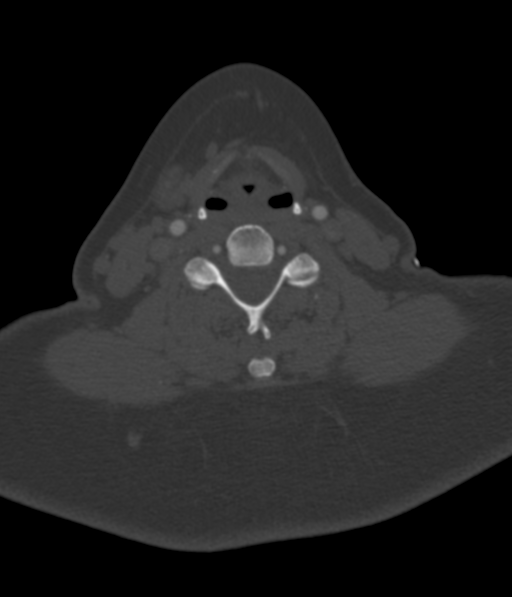
[im 421/701  soft-tissue]
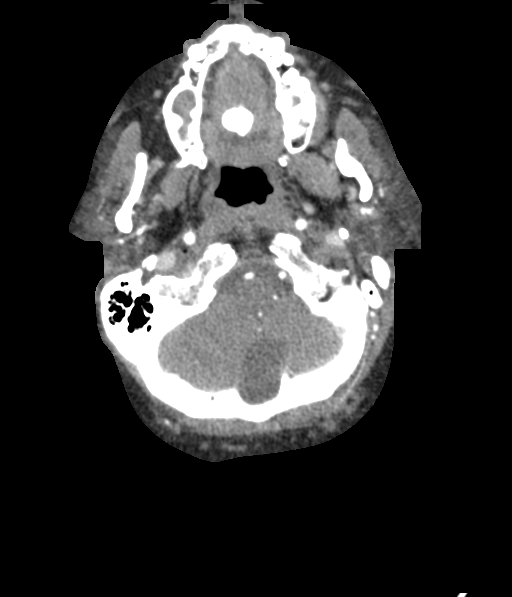
[im 561/701  bone]
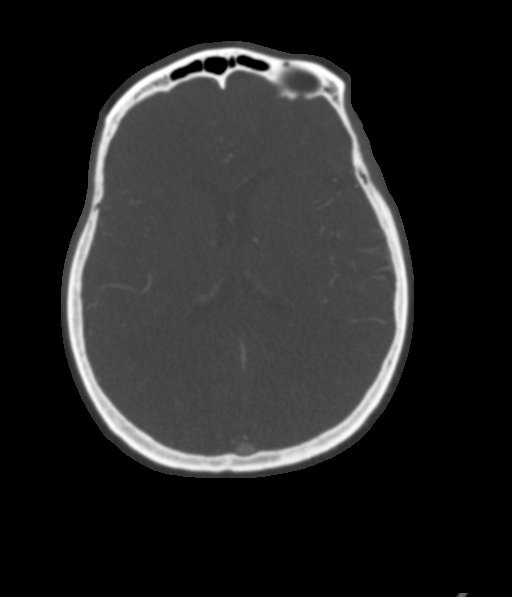

[4 of 33 positions shown; findings below may reference images not displayed]

FINDINGS: CT HEAD FINDINGS

Brain:

Cerebral volume is normal.

There is no acute intracranial hemorrhage.

No demarcated cortical infarct.

No extra-axial fluid collection.

No evidence of intracranial mass.

No midline shift.

Vascular: No hyperdense vessel.

Skull: Normal. Negative for fracture or focal lesion.

Sinuses: Small volume frothy secretions within the left sphenoid
sinus. Mild mucosal thickening within the right greater than left
maxillary sinuses.

Orbits: No mass or acute finding.

Review of the MIP images confirms the above findings

CTA NECK FINDINGS

Aortic arch: Standard aortic branching. Mild atherosclerotic plaque
within the visualized aortic arch and innominate artery. No
hemodynamically significant innominate or proximal subclavian artery
stenosis.

Right carotid system: CCA and ICA patent within the neck without
stenosis. Minimal soft and calcified plaque within the carotid
bifurcation.

Left carotid system: CCA and ICA patent within the neck without
stenosis. Minimal soft and calcified plaque within the carotid
bifurcation and proximal ICA.

Vertebral arteries: Codominant and patent within the neck without
stenosis.

Skeleton: Spondylosis. No acute bony abnormality or aggressive
osseous lesion.

Other neck: No neck mass. Subcentimeter left thyroid lobe nodule not
meeting consensus criteria for ultrasound follow-up. Multiple small
nodules within the bilateral parotid glands.

Upper chest: No consolidation within the imaged lung apices. 6 mm
right upper lobe pulmonary nodule (series 6, image 60).

Review of the MIP images confirms the above findings

CTA HEAD FINDINGS

Anterior circulation:

The intracranial internal carotid arteries are patent. The M1 middle
cerebral arteries are patent. No M2 proximal branch occlusion or
high-grade proximal stenosis is identified. The anterior cerebral
arteries are patent. No intracranial aneurysm is identified.

Posterior circulation:

The intracranial vertebral arteries are patent. The basilar artery
is patent. The posterior cerebral arteries are patent. Getting
arteries are present bilaterally.

Venous sinuses: Within the limitations of contrast timing, no
convincing thrombus.

Anatomic variants: None significant

Review of the MIP images confirms the above findings
IMPRESSION: CT head:

1. No evidence of acute intracranial abnormality.
2. Paranasal sinus disease as described. Correlate for acute
sinusitis.

CTA neck:

1. The bilateral common carotid, internal carotid and vertebral
arteries are patent within the neck without stenosis. Mild
atherosclerotic plaque within the bilateral carotid systems, as
described.
2. Multiple small nodules within the parotid glands bilaterally.
Findings are nonspecific and differential considerations include
Sjogren syndrome, HIV, or lymphoma, among others. Clinical
correlation is recommended.

CTA head:

No intracranial large vessel occlusion or proximal high-grade
stenosis.

## 2022-04-27 ENCOUNTER — Encounter: Payer: Self-pay | Admitting: Nurse Practitioner

## 2022-04-27 ENCOUNTER — Ambulatory Visit (INDEPENDENT_AMBULATORY_CARE_PROVIDER_SITE_OTHER): Payer: Medicare Other | Admitting: Nurse Practitioner

## 2022-04-27 VITALS — BP 112/72 | HR 80 | Temp 97.7°F | Resp 16 | Ht 67.0 in | Wt 229.2 lb

## 2022-04-27 DIAGNOSIS — F324 Major depressive disorder, single episode, in partial remission: Secondary | ICD-10-CM

## 2022-04-27 DIAGNOSIS — G43109 Migraine with aura, not intractable, without status migrainosus: Secondary | ICD-10-CM | POA: Diagnosis not present

## 2022-04-27 DIAGNOSIS — I4891 Unspecified atrial fibrillation: Secondary | ICD-10-CM

## 2022-04-27 DIAGNOSIS — F320 Major depressive disorder, single episode, mild: Secondary | ICD-10-CM

## 2022-04-27 DIAGNOSIS — K047 Periapical abscess without sinus: Secondary | ICD-10-CM

## 2022-04-27 DIAGNOSIS — T887XXA Unspecified adverse effect of drug or medicament, initial encounter: Secondary | ICD-10-CM | POA: Diagnosis not present

## 2022-04-27 DIAGNOSIS — K279 Peptic ulcer, site unspecified, unspecified as acute or chronic, without hemorrhage or perforation: Secondary | ICD-10-CM | POA: Diagnosis not present

## 2022-04-27 MED ORDER — PANTOPRAZOLE SODIUM 40 MG PO TBEC: 40.0000 mg | DELAYED_RELEASE_TABLET | Freq: Every day | ORAL | 1 refills | Status: DC

## 2022-04-27 NOTE — Assessment & Plan Note (Signed)
Patient states she would like clarification as to what time of day she can take with medications.  She is having some side effects sounds GI related.  Will consult with pharmacist to do a medication review.

## 2022-04-27 NOTE — Assessment & Plan Note (Signed)
Has improved.  Patient recently had dental work done on the right lower side with a tooth extraction.  States she required suturing.  States that part of his sticking out of the gum which it is.  Red but no discharge or overt tenderness.  Recommend patient follow-up with dentist

## 2022-04-27 NOTE — Patient Instructions (Signed)
Nice to see you today The pharmacist and therapist should reach out to you within the next 2 weeks Follow up with me in 4.5 months for your physical and labs I have increased your protonix to 40mg  a day

## 2022-04-27 NOTE — Progress Notes (Signed)
Established Patient Office Visit  Subjective   Patient ID: Joanne Clark, female    DOB: 1955/12/12  Age: 67 y.o. MRN: [id number]  Chief Complaint  Patient presents with   Establish Care    Transferring from Dr. Selena Batten    HPI  Transfer of care: last seen by Tillman Abide, MD on 03/01/2022 CPE 09/06/2021 with Gweneth Dimitri, MD  Migraine; States that it was a history of it. States that she will take excedrin and will take care of it. States that she will have an aura "sharp line in the vision"  states that more on the right side. States that she has a history of a concussion early in life. Does have light sensitivty    Afib: eliquis and NSTEMi management. States that it is through [facility] cardiology and   GERD: states that she is on protonix does have a hx of EGD. States that it does not work fully. States that she will wake up with GERD.   MDD: states that she is more fearful as of late. States that it has been mounting. States sh eis unsure if it is because of her heart attack. States that her depression is multifactoral with her family responsibilites and trouble sleeping   Sleep: states that she does have a sleep tape that can help. Does not help all the time. States that she has trouble falling asleep and staying to sleep Last night she went to bed aroun 9 and woke up at 1 am and was awake for several hours and then may go back to sleep    Colonoscopy: 2019, repeat in 10 years 2029 Mammogram 10/21/2020  Pap smear: 05/25/2020  Tdap: 2012, up to date. States she got cut out of town and got it updated then. Within 10 years  Flu: not up to date Covid: utd Pna: prevnar 13 in 2020 Shingles: first vaccine at pharmacy, needs second one     Review of Systems  Constitutional:  Negative for chills and fever.  Respiratory:  Negative for shortness of breath.   Cardiovascular:  Negative for chest pain.  Gastrointestinal:  Negative for abdominal pain, blood in stool, constipation and  diarrhea.       BM daily   Genitourinary:  Negative for dysuria and hematuria.  Neurological:  Positive for headaches.  Psychiatric/Behavioral:  Negative for hallucinations and suicidal ideas. The patient has insomnia.       Objective:     BP 112/72   Pulse 80   Temp 97.7 F (36.5 C)   Resp 16   Ht 5\' 7"  (1.702 m)   Wt 229 lb 4 oz (104 kg)   SpO2 97%   BMI 35.91 kg/m  BP Readings from Last 3 Encounters:  04/27/22 112/72  03/01/22 110/70  12/03/21 100/62   Wt Readings from Last 3 Encounters:  04/27/22 229 lb 4 oz (104 kg)  03/01/22 230 lb (104.3 kg)  12/03/21 229 lb 6 oz (104 kg)      Physical Exam Vitals and nursing note reviewed.  Constitutional:      Appearance: Normal appearance.  Cardiovascular:     Rate and Rhythm: Normal rate and regular rhythm.     Heart sounds: Normal heart sounds.  Pulmonary:     Effort: Pulmonary effort is normal.     Breath sounds: Normal breath sounds.  Neurological:     General: No focal deficit present.     Mental Status: She is alert.     Deep Tendon Reflexes:  Reflex Scores:      Bicep reflexes are 2+ on the right side and 2+ on the left side.      Patellar reflexes are 2+ on the right side and 2+ on the left side.    Comments: Bilateral upper and lower extremity strength 5/5      No results found for any visits on 04/27/22.    The ASCVD Risk score (Arnett DK, et al., 2019) failed to calculate for the following reasons:   The patient has a prior MI or stroke diagnosis    Assessment & Plan:   Problem List Items Addressed This Visit       Cardiovascular and Mediastinum   Atrial fibrillation    Patient currently maintained on apixaban 5 mg twice daily.  Followed by cardiology continue medication as prescribed follow-up as recommended with cardiology      Migraine headache    Infrequent in nature.  Does have a history of a back concussion when she was younger in life.  Not currently on any medications stable         Digestive   PUD (peptic ulcer disease)    Patient was maintained on Protonix 20 mg.  States having breakthrough symptoms only breath at night.  Will increase to Protonix 40 mg nightly.      Relevant Medications   pantoprazole (PROTONIX) 40 MG tablet   Dental infection    Has improved.  Patient recently had dental work done on the right lower side with a tooth extraction.  States she required suturing.  States that part of his sticking out of the gum which it is.  Red but no discharge or overt tenderness.  Recommend patient follow-up with dentist        Other   Major depressive disorder - Primary    History of the same.  Patient has been on medication in the past when I can see was Lexapro was not beneficial.  States she tried others but unsure of the names.  Wants to hold off on medication management at this juncture would like to try therapy.  Ambulatory referral to therapy placed today      Relevant Orders   Ambulatory referral to Psychology   Medication side effect    Patient states she would like clarification as to what time of day she can take with medications.  She is having some side effects sounds GI related.  Will consult with pharmacist to do a medication review.      Relevant Orders   AMB Referral to [facility] Coordinaton ([facility] Patients)    Return in about 4 months (around 09/08/2022) for CPE and Labs.    Audria Nine, NP

## 2022-04-27 NOTE — Assessment & Plan Note (Signed)
Patient currently maintained on apixaban 5 mg twice daily.  Followed by cardiology continue medication as prescribed follow-up as recommended with cardiology

## 2022-04-27 NOTE — Assessment & Plan Note (Signed)
History of the same.  Patient has been on medication in the past when I can see was Lexapro was not beneficial.  States she tried others but unsure of the names.  Wants to hold off on medication management at this juncture would like to try therapy.  Ambulatory referral to therapy placed today

## 2022-04-27 NOTE — Assessment & Plan Note (Signed)
Patient was maintained on Protonix 20 mg.  States having breakthrough symptoms only breath at night.  Will increase to Protonix 40 mg nightly.

## 2022-04-27 NOTE — Assessment & Plan Note (Signed)
Infrequent in nature.  Does have a history of a back concussion when she was younger in life.  Not currently on any medications stable

## 2022-04-29 ENCOUNTER — Other Ambulatory Visit: Payer: Self-pay | Admitting: Family

## 2022-04-29 ENCOUNTER — Telehealth: Payer: Self-pay

## 2022-04-29 DIAGNOSIS — E782 Mixed hyperlipidemia: Secondary | ICD-10-CM

## 2022-04-29 NOTE — Progress Notes (Signed)
Care Management & Coordination Services Pharmacy Team  Reason for Encounter: Initial Appointment Reminder  Contacted patient to confirm in office appointment with Al Corpus, PharmD on 05/03/2022 at 9:00.  Unsuccessful outreach. Left voicemail for patient to return call.  Chart review:  Recent office visits:  04/27/22 Mordecai Maes, NP Major depressive disorder Referral to Psychology Change: Pantoprazole 40 mg vs. 20 mg Stop (completed): Amoxicillin 1,000 mg F/U 4 months 12/03/21 Mordecai Maes, NP Leg Pain Start: XYZAL 5 MG tablet Stop (completed): Amoxicillin 500 mg Stop (completed): Tramadol 50 mg Referral: Rheumatology  Recent consult visits:  03/09/22 Caro Hight, DDS No other information 03/01/22 Tillman Abide, MD Dental infection Start: Amoxicillin 1,000 mg Stop (Patient): LEXAPRO 10 MG tablet Stop (Patient): XYZAL 5 MG tablet  01/20/22 CI CT 12/31/21 Bud Face (Otolaryngology) Hypertrophy of Tonsils Procedure: PR Laryngoscopy Flexible Diagnostic No other information  Hospital visits:  None in previous 6 months  Star Rating Drugs:  Medication:  Last Fill: Day Supply Atorvastatin 80 mg 02/01/22 90 Lisinopril 5 mg  02/23/22 90  Care Gaps: Annual wellness visit in last year? Yes 06/02/2021  Al Corpus, PharmD notified  Claudina Lick, RMA Clinical Pharmacy Assistant [phone]

## 2022-05-02 NOTE — Progress Notes (Unsigned)
Care Management & Coordination Services Pharmacy Note  05/02/2022 Name:  Joanne Clark MRN:  [id number] DOB:  [birth year]  Summary: Initial OV -GERD: pt reports reflux symptoms at night and GI upset after taking medications; discussed optimal timing of PPI (1 hr before food) and other medications (after food) -Anxiety/depression: pt reports worsening lately in s/o life stressors; she previously took Lexapro but stopped due to headaches when dose increased from 5 to 10 mg; discussed SSRI side effects often resolve within a week or 2; discussed trial of alternative SSRI vs re-trial of Lexapro and pt opted for re-trial of Lexapro  Recommendations/Changes made from today's visit: -Advised to move pantoprazole to 1 hour before breakfast;  -Advised to move all other medications to after breakfast and Vitamins after dinner -Advised she can take OTC Pepcid 10-15 min prior to food PRN, and can take Tums PRN for relief of symptoms -Re-start escitalopram 5 mg daily; contact provider with any issues  Follow up plan: -Health Concierge will call patient 2 weeks re: Lexapro tolerability -Pharmacist follow up televisit scheduled for 1 month -PCP 09/12/22    Subjective: Joanne Clark is an 67 y.o. year old female who is a primary patient of Toney Reil, Genene Churn, NP.  The care coordination team was consulted for assistance with disease management and care coordination needs.    Engaged with patient face to face for initial visit. Patient lives at home with her husband [name]. Their daughter has many healthy issues and recently moved in with them. 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  Recent office visits: 04/27/22 NP Matt Cable OV: TOC. PUD - increase pantoprazole to 40 mg. C/o MDD - declined medication, referred for therapy. Concern with medication timing - referred to PharmD. RTC 4 months for CPE.  03/01/22 Dr Alphonsus Sias  OV: dental infxn - rx amoxicillin. D/C lexapro (pt preference)  Recent consult visits: 06/16/21 Dr Hyacinth Meeker (Cardiology): Hx STEMI 03/2018. No sx of HF. No changes.  Hospital visits: None in previous 6 months   Objective:  Lab Results  Component Value Date   CREATININE 0.76 12/03/2021   BUN 18 12/03/2021   GFR 80.45 12/03/2021   GFRNONAA >60 04/06/2020   GFRAA >60 12/23/2017   NA 141 12/03/2021   K 4.2 12/03/2021   CALCIUM 9.6 12/03/2021   CO2 30 12/03/2021   GLUCOSE 117 (H) 12/03/2021    Lab Results  Component Value Date/Time   GFR 80.45 12/03/2021 11:37 AM   GFR 79.49 05/24/2021 11:06 AM    Last diabetic Eye exam: No results found for: "HMDIABEYEEXA"  Last diabetic Foot exam: No results found for: "HMDIABFOOTEX"   Lab Results  Component Value Date   CHOL 139 05/24/2021   HDL 63.30 05/24/2021   LDLCALC 56 05/24/2021   TRIG 97.0 05/24/2021   CHOLHDL 2 05/24/2021       Latest Ref Rng & Units 12/03/2021   11:37 AM 05/24/2021   11:06 AM 06/18/2019    4:35 PM  Hepatic Function  Total Protein 6.0 - 8.3 g/dL 7.1  6.7    Albumin 3.5 - 5.2 g/dL 4.2  4.1  4.3   AST 0 - 37 U/L 18  18  23    ALT 0 - 35 U/L 23  24  29    Alk Phosphatase 39 - 117 U/L 98  92  118   Total Bilirubin 0.2 - 1.2 mg/dL 0.6  0.8  0.5  Bilirubin, Direct 0.00 - 0.40 mg/dL   8.00     No results found for: "TSH", "FREET4"     Latest Ref Rng & Units 12/03/2021   11:37 AM 05/24/2021   11:06 AM 04/06/2020   10:53 AM  CBC  WBC 4.0 - 10.5 K/uL 6.5  6.0  5.9   Hemoglobin 12.0 - 15.0 g/dL 34.9  17.9  15.0   Hematocrit 36.0 - 46.0 % 41.7  38.2  40.7   Platelets 150.0 - 400.0 K/uL 247.0  244.0  222     No results found for: "VD25OH", "VITAMINB12"  Clinical ASCVD: Yes  - history MI The ASCVD Risk score (Arnett DK, et al., 2019) failed to calculate for the following reasons:   The patient has a prior MI or stroke diagnosis    CHA2DS2/VAS Stroke Risk Points  Current as of 3 days ago     4 >= 2 Points:  High Risk    Points Metrics  1 Has Congestive Heart Failure:  Yes    Current as of 3 days ago  1 Has Vascular Disease:  Yes    Current as of 3 days ago  0 Has Hypertension:  No    Current as of 3 days ago  1 Age:  1    Current as of 3 days ago  0 Has Diabetes:  No    Current as of 3 days ago  0 Had Stroke:  No  Had TIA:  No  Had Thromboembolism:  No    Current as of 3 days ago  1 Female:  Yes    Current as of 3 days ago        03/01/2022   10:56 AM 09/06/2021   10:40 AM 06/02/2021    3:36 PM  Depression screen PHQ 2/9  Decreased Interest 0 1 0  Down, Depressed, Hopeless 1 1 1   PHQ - 2 Score 1 2 1   Altered sleeping 0 2   Tired, decreased energy 1 1   Change in appetite 0 1   Feeling bad or failure about yourself  0 0   Trouble concentrating 0 0   Moving slowly or fidgety/restless 0 0   Suicidal thoughts 0 0   PHQ-9 Score 2 6   Difficult doing work/chores Not difficult at all Not difficult at all      Social History   Tobacco Use  Smoking Status Never  Smokeless Tobacco Never   BP Readings from Last 3 Encounters:  04/27/22 112/72  03/01/22 110/70  12/03/21 100/62   Pulse Readings from Last 3 Encounters:  04/27/22 80  03/01/22 86  12/03/21 78   Wt Readings from Last 3 Encounters:  04/27/22 229 lb 4 oz (104 kg)  03/01/22 230 lb (104.3 kg)  12/03/21 229 lb 6 oz (104 kg)   BMI Readings from Last 3 Encounters:  04/27/22 35.91 kg/m  03/01/22 36.02 kg/m  12/03/21 35.93 kg/m    Allergies  Allergen Reactions   Gluten Meal     Other reaction(s): GI Reaction   Xyzal [Levocetirizine] Other (See Comments)    Causes nose bleeds    Medications Reviewed Today     Reviewed by Vertis Kelch, CMA (Certified Medical Assistant) on 04/27/22 at 1401  Med List Status: <None>   Medication Order Taking? Sig Documenting Provider Last Dose Status Informant  amoxicillin (AMOXIL) 500 MG tablet [id number]  Take 2 tablets (1,000 mg total) by mouth 2 (two) times daily.  Karie Schwalbe, MD  Consider Medication Status and Discontinue (Completed Course)   apixaban (ELIQUIS) 5 MG TABS tablet [id number]  Take 1 tablet by mouth 2 (two) times daily. cardio Yisroel Ramming, MD  Active   atorvastatin (LIPITOR) 80 MG tablet [id number]  Take 1 tablet (80 mg total) by mouth daily. For cholesterol. Will need to be seen in office for more refills. Worthy Rancher B, FNP  Active   cyclobenzaprine (FLEXERIL) 5 MG tablet [id number]  Take 1 tablet (5 mg total) by mouth 3 (three) times daily as needed for muscle spasms. Gweneth Dimitri, MD  Active   furosemide (LASIX) 20 MG tablet [id number]  Take 20 mg by mouth as needed. cardio [provider]  Active   lisinopril (ZESTRIL) 5 MG tablet [id number]  Take 1 tablet (5 mg total) by mouth daily. cardio Worthy Rancher B, FNP  Active   metoprolol succinate (TOPROL-XL) 25 MG 24 hr tablet [id number]  Take 12.5 mg by mouth daily. cardio [provider]  Active   nitroGLYCERIN (NITROSTAT) 0.4 MG SL tablet [id number]  Place under the tongue. cardio [provider]  Active   pantoprazole (PROTONIX) 20 MG tablet [id number]  Take 1 tablet (20 mg total) by mouth daily. For heartburn Eulis Foster, FNP  Active             SDOH:  (Social Determinants of Health) assessments and interventions performed: No - done May 2023 SDOH Interventions    Flowsheet Row Office Visit from 03/01/2022 in [facility] Clinical Support from 06/02/2021 in [facility] [place]  SDOH Interventions    Food Insecurity Interventions -- Intervention Not Indicated  Housing Interventions -- Intervention Not Indicated  Transportation Interventions -- Intervention Not Indicated  Depression Interventions/Treatment  PHQ2-9 Score <4 Follow-up Not Indicated --  Financial Strain Interventions -- Intervention Not Indicated  Physical Activity Interventions -- Intervention Not Indicated  Stress  Interventions -- Intervention Not Indicated  Social Connections Interventions -- Intervention Not Indicated       Medication Assistance: None required.  Patient affirms current coverage meets needs.  Medication Access: Within the past 30 days, how often has patient missed a dose of medication? 0 Is a pillbox or other method used to improve adherence? Yes  Factors that may affect medication adherence? no barriers identified Are meds synced by current pharmacy? No  Are meds delivered by current pharmacy? No  Does patient experience delays in picking up medications due to transportation concerns? No   [facility] Services Reviewed: Is patient disadvantaged to use [facility]?: No  Current Rx insurance plan: [facility] Name and location of [facility]  [facility] [phone] Nicholes Rough [facility] [address] [place] [address] Phone: [phone] Fax: [phone]  [facility] services reviewed with patient today?: No  Patient requests to transfer care to [facility]?: No  Reason patient declined to change pharmacies: Not mentioned at this visit  Compliance/Adherence/Medication fill history: Care Gaps: DEXA (never done)  Star-Rating Drugs: Atorvastatin - PDC 100% Lisinopril - PDC 89%   ASSESSMENT / PLAN  Heart Failure (Goal: manage symptoms and prevent exacerbations) -Controlled - pt does not need furosemide often -Current home BP/HR readings: n/a -Current home daily weights: n/a -Last ejection fraction: 55-60% (Date: 07/2018) - improved from 30-35% -HF type: HFimpEF (EF improved from <40% to > 40%) -NYHA Class: I (no actitivty limitation) -AHA HF Stage: C (Heart disease and symptoms present) -Current treatment: Furosemide 20 mg PRN -  Appropriate, Effective, Safe, Accessible Lisinopril 5 mg daily -Appropriate, Effective, Safe, Accessible Metoprolol succinate 25 mg - 1/2 tab daily -Appropriate, Effective, Safe, Accessible -Medications  previously tried: n/a  -Educated on Benefits of medications for managing symptoms and prolonging life; Importance of blood pressure control -Recommended to continue current medication  Atrial Fibrillation (Goal: prevent stroke and major bleeding) -Controlled - pt denies symptoms -CHADSVASC: 4; isolated Afib around time of MI -Current treatment: Metoprolol succinate 25 mg - 1/2 tab daily -Appropriate, Effective, Safe, Accessible Eliquis 5 mg BID -Appropriate, Effective, Safe, Accessible -Medications previously tried: n/a -Counseled on bleeding risk associated with Eliquis and importance of self-monitoring for signs/symptoms of bleeding; avoidance of NSAIDs due to increased bleeding risk with anticoagulants; seeking medical attention after a head injury or if there is blood in the urine/stool; -Recommended to continue current medication  Hyperlipidemia / CAD (LDL goal < 70) -Controlled - LDL 56 (05/2021) at goal -Hx STEMI 03/2018 -Current treatment: Atorvastatin 80 mg daily -Appropriate, Effective, Safe, Accessible NTG 0.4 mg SL prn -not used -Medications previously tried: n/a  -Educated on Cholesterol goals;  -Recommended to continue current medication  GERD / PUD (Goal: minimize symptoms of reflux ) -Not ideally controlled - pt reports reflux symptoms at night and GI upset after taking medications -GI consult 2022 - workup largely WNL, EGD showed gastritis. Try lactose-free diet, avoid red meat, processed foods, fatty foods  -Current treatment  Pantoprazole 40 mg daily - Appropriate,Query Effective -Medications previously tried: none reported  -Discussed optimal timing of PPI is ~1 hr before food;  -Advised to move pantoprazole to 1 hour before breakfast;  -advised to move all other medications to after breakfast and Vitamins after dinner -Advised she can take OTC Pepcid 10-15 min prior to food PRN, and can take Tums PRN for relief of symptoms  Depression/Anxiety (Goal: improve  anxiety) -Uncontrolled - pt reports multiple stressors in her life (caring for husband and daughter) and struggling with worsening anxiety lately; she did previously take Lexapro and stopped due to headaches when dose increased from 5 to 10 mg; she has also recently been referred to psychology -PHQ9: 2 (02/2022) -GAD7: 1 (08/2021) -Connected with PCP for mental health support -Current treatment: None -Medications previously tried/failed: escitalopram 10 mg (headaches) -Educated on Benefits of cognitive-behavioral therapy with or without medication -Discussed SSRI side effects often resolve within a week or 2 of initiation, it may be worth re-trial -Re-start escitalopram 5 mg daily; contact provider with any issues  Health Maintenance -Vaccine gaps: Pneumovax or Prevnar20; TDAP; Shingrix    Al Corpus, PharmD, P[place]y [place], CPP Clinical Pharmacist Practitioner [facility] at [facility] [phone]

## 2022-05-03 ENCOUNTER — Ambulatory Visit: Payer: Medicare Other | Admitting: Pharmacist

## 2022-05-03 NOTE — Patient Instructions (Signed)
Visit Information  Phone number for Pharmacist: [phone]  Thank you for meeting with me to discuss your medications! I look forward to working with you to achieve your health care goals. Below is a summary of what we talked about during the visit:  Take the pantoprazole 40 mg 1 hr before breakfast. You can take 20 mg at bedtime.  You can take OTC Pepcid 10-15 min before eating spicy food. You can use Tums when you are having symptoms of reflux.  Take all your other medications after breakfast (except Eliquis that is twice a day).  Take your Vitamins after dinner.  Try Lexapro (escitalopram) 5 mg once daily. Any side effects should improve within a week or so, let us know if you have issues.    Al Corpus, PharmD, BCACP Clinical Pharmacist [facility] [phone]

## 2022-05-10 ENCOUNTER — Telehealth: Payer: Self-pay | Admitting: Nurse Practitioner

## 2022-05-10 NOTE — Telephone Encounter (Signed)
Contacted Joanne Clark to schedule their annual wellness visit. Appointment made for 06/16/2022. Was not able to lvm regarding changes to her appt.  [facility] Care Guide [facility] AWV TEAM Direct Dial: [phone]

## 2022-05-17 DIAGNOSIS — L821 Other seborrheic keratosis: Secondary | ICD-10-CM | POA: Diagnosis not present

## 2022-05-17 DIAGNOSIS — L853 Xerosis cutis: Secondary | ICD-10-CM | POA: Diagnosis not present

## 2022-05-17 DIAGNOSIS — D2361 Other benign neoplasm of skin of right upper limb, including shoulder: Secondary | ICD-10-CM | POA: Diagnosis not present

## 2022-05-17 DIAGNOSIS — D485 Neoplasm of uncertain behavior of skin: Secondary | ICD-10-CM | POA: Diagnosis not present

## 2022-05-25 ENCOUNTER — Telehealth: Payer: Self-pay

## 2022-05-25 NOTE — Progress Notes (Signed)
Called patient; no answer; left message.  Lindsey Foltanski, PharmD notified  Samanth Mirkin, RMA Clinical Pharmacy Assistant [phone]   

## 2022-05-25 NOTE — Telephone Encounter (Signed)
-----   Message from Kathyrn Sheriff, [facility] sent at 05/05/2022  9:27 AM EDT ----- Regarding: call pt Please call patient and check on Lexapro (escitalopram) tolerability

## 2022-05-26 NOTE — Progress Notes (Signed)
Called patient for Lexapro (escitalopram) tolerability. No answer; left message.  Al Corpus, PharmD notified  Claudina Lick, [place] Clinical Pharmacy Assistant [phone]

## 2022-05-30 ENCOUNTER — Telehealth: Payer: Self-pay

## 2022-05-30 NOTE — Progress Notes (Signed)
Care Management & Coordination Services Pharmacy Team  Reason for Encounter: Appointment Reminder  Contacted patient to confirm telephone appointment with Al Corpus , PharmD on 06/01/22 at 3:45. Unsuccessful outreach. Left voicemail for patient to return call.   Have you seen any other providers since your last visit with PCP? No   Star Rating Drugs:  Medication:  Last Fill: Day Supply Atorvastatin 80mg  02/01/22 90 Lisinopril 5mg   05/22/22  90   Care Gaps: Annual wellness visit in last year? Yes  Al Corpus, PharmD notified  Burt Knack, [facility] Clinical Pharmacy Assistant [phone]

## 2022-06-01 ENCOUNTER — Ambulatory Visit: Payer: Medicare Other | Admitting: Pharmacist

## 2022-06-01 NOTE — Progress Notes (Unsigned)
Care Management & Coordination Services Pharmacy Note  06/01/2022 Name:  Joanne Clark MRN:  [id number] DOB:  [birth year]  Summary: Initial OV -GERD: pt reports reflux symptoms at night and GI upset after taking medications; discussed optimal timing of PPI (1 hr before food) and other medications (after food) -Anxiety/depression: pt reports worsening lately in s/o life stressors; she previously took Lexapro but stopped due to headaches when dose increased from 5 to 10 mg; discussed SSRI side effects often resolve within a week or 2; discussed trial of alternative SSRI vs re-trial of Lexapro and pt opted for re-trial of Lexapro  Recommendations/Changes made from today's visit: -Advised to move pantoprazole to 1 hour before breakfast;  -Advised to move all other medications to after breakfast and Vitamins after dinner -Advised she can take OTC Pepcid 10-15 min prior to food PRN, and can take Tums PRN for relief of symptoms -Re-start escitalopram 5 mg daily; contact provider with any issues  Follow up plan: -Health Concierge will call patient 2 weeks re: Lexapro tolerability -Pharmacist follow up televisit scheduled for 1 month -PCP 09/12/22    Subjective: Joanne Clark is an 67 y.o. year old female who is a primary patient of Toney Reil, Genene Churn, NP.  The care coordination team was consulted for assistance with disease management and care coordination needs.    Engaged with patient face to face for initial visit. Patient lives at home with her husband Joanne Clark. Their daughter has many healthy issues and recently moved in with them. 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  Recent office visits: 04/27/22 NP Matt Cable OV: TOC. PUD - increase pantoprazole to 40 mg. C/o MDD - declined medication, referred for therapy. Concern with medication timing - referred to PharmD. RTC 4 months for CPE.  03/01/22 Dr Alphonsus Sias  OV: dental infxn - rx amoxicillin. D/C lexapro (pt preference)  Recent consult visits: 06/16/21 Dr Hyacinth Meeker (Cardiology): Hx STEMI 03/2018. No sx of HF. No changes.  Hospital visits: None in previous 6 months   Objective:  Lab Results  Component Value Date   CREATININE 0.76 12/03/2021   BUN 18 12/03/2021   GFR 80.45 12/03/2021   GFRNONAA >60 04/06/2020   GFRAA >60 12/23/2017   NA 141 12/03/2021   K 4.2 12/03/2021   CALCIUM 9.6 12/03/2021   CO2 30 12/03/2021   GLUCOSE 117 (H) 12/03/2021    Lab Results  Component Value Date/Time   GFR 80.45 12/03/2021 11:37 AM   GFR 79.49 05/24/2021 11:06 AM    Last diabetic Eye exam: No results found for: "HMDIABEYEEXA"  Last diabetic Foot exam: No results found for: "HMDIABFOOTEX"   Lab Results  Component Value Date   CHOL 139 05/24/2021   HDL 63.30 05/24/2021   LDLCALC 56 05/24/2021   TRIG 97.0 05/24/2021   CHOLHDL 2 05/24/2021       Latest Ref Rng & Units 12/03/2021   11:37 AM 05/24/2021   11:06 AM 06/18/2019    4:35 PM  Hepatic Function  Total Protein 6.0 - 8.3 g/dL 7.1  6.7    Albumin 3.5 - 5.2 g/dL 4.2  4.1  4.3   AST 0 - 37 U/L 18  18  23    ALT 0 - 35 U/L 23  24  29    Alk Phosphatase 39 - 117 U/L 98  92  118   Total Bilirubin 0.2 - 1.2 mg/dL 0.6  0.8  0.5  Bilirubin, Direct 0.00 - 0.40 mg/dL   0.98     No results found for: "TSH", "FREET4"     Latest Ref Rng & Units 12/03/2021   11:37 AM 05/24/2021   11:06 AM 04/06/2020   10:53 AM  CBC  WBC 4.0 - 10.5 K/uL 6.5  6.0  5.9   Hemoglobin 12.0 - 15.0 g/dL 11.9  14.7  82.9   Hematocrit 36.0 - 46.0 % 41.7  38.2  40.7   Platelets 150.0 - 400.0 K/uL 247.0  244.0  222     No results found for: "VD25OH", "VITAMINB12"  Clinical ASCVD: Yes  - history MI The ASCVD Risk score (Arnett DK, et al., 2019) failed to calculate for the following reasons:   The patient has a prior MI or stroke diagnosis    CHA2DS2/VAS Stroke Risk Points  Current as of 3 days ago     4 >= 2 Points:  High Risk    Points Metrics  1 Has Congestive Heart Failure:  Yes    Current as of 3 days ago  1 Has Vascular Disease:  Yes    Current as of 3 days ago  0 Has Hypertension:  No    Current as of 3 days ago  1 Age:  71    Current as of 3 days ago  0 Has Diabetes:  No    Current as of 3 days ago  0 Had Stroke:  No  Had TIA:  No  Had Thromboembolism:  No    Current as of 3 days ago  1 Female:  Yes    Current as of 3 days ago        03/01/2022   10:56 AM 09/06/2021   10:40 AM 06/02/2021    3:36 PM  Depression screen PHQ 2/9  Decreased Interest 0 1 0  Down, Depressed, Hopeless 1 1 1   PHQ - 2 Score 1 2 1   Altered sleeping 0 2   Tired, decreased energy 1 1   Change in appetite 0 1   Feeling bad or failure about yourself  0 0   Trouble concentrating 0 0   Moving slowly or fidgety/restless 0 0   Suicidal thoughts 0 0   PHQ-9 Score 2 6   Difficult doing work/chores Not difficult at all Not difficult at all      Social History   Tobacco Use  Smoking Status Never  Smokeless Tobacco Never   BP Readings from Last 3 Encounters:  04/27/22 112/72  03/01/22 110/70  12/03/21 100/62   Pulse Readings from Last 3 Encounters:  04/27/22 80  03/01/22 86  12/03/21 78   Wt Readings from Last 3 Encounters:  04/27/22 229 lb 4 oz (104 kg)  03/01/22 230 lb (104.3 kg)  12/03/21 229 lb 6 oz (104 kg)   BMI Readings from Last 3 Encounters:  04/27/22 35.91 kg/m  03/01/22 36.02 kg/m  12/03/21 35.93 kg/m    Allergies  Allergen Reactions   Gluten Meal     Other reaction(s): GI Reaction   Xyzal [Levocetirizine] Other (See Comments)    Causes nose bleeds    Medications Reviewed Today     Reviewed by Kathyrn Sheriff, [facility] (Pharmacist) on 06/01/22 at 1544  Med List Status: <None>   Medication Order Taking? Sig Documenting Provider Last Dose Status Informant  apixaban (ELIQUIS) 5 MG TABS tablet [id number] Yes Take 1 tablet by mouth 2 (two) times daily. cardio Yisroel Ramming, MD  Taking Active  atorvastatin (LIPITOR) 80 MG tablet [id number] Yes Take 1 tablet (80 mg total) by mouth daily. For cholesterol. Will need to be seen in office for more refills. Worthy Rancher B, FNP Taking Active   escitalopram (LEXAPRO) 5 MG tablet [id number] Yes Take 5 mg by mouth daily. [provider] Taking Active   furosemide (LASIX) 20 MG tablet [id number]  Take 20 mg by mouth as needed. cardio [provider]  Expired 05/05/22 2359   lisinopril (ZESTRIL) 5 MG tablet [id number] Yes Take 1 tablet (5 mg total) by mouth daily. cardio Eulis Foster, FNP Taking Active   metoprolol succinate (TOPROL-XL) 25 MG 24 hr tablet [id number]  Take 12.5 mg by mouth daily. cardio [provider]  Expired 05/05/22 2359   nitroGLYCERIN (NITROSTAT) 0.4 MG SL tablet [id number] Yes Place under the tongue. cardio [provider] Taking Active   pantoprazole (PROTONIX) 40 MG tablet [id number] Yes Take 1 tablet (40 mg total) by mouth daily. Eden Emms, NP Taking Active             SDOH:  (Social Determinants of Health) assessments and interventions performed: No - done May 2023 SDOH Interventions    Flowsheet Row Office Visit from 03/01/2022 in [facility] [place] Clinical Support from 06/02/2021 in [facility] [place]  SDOH Interventions    Food Insecurity Interventions -- Intervention Not Indicated  Housing Interventions -- Intervention Not Indicated  Transportation Interventions -- Intervention Not Indicated  Depression Interventions/Treatment  PHQ2-9 Score <4 Follow-up Not Indicated --  Financial Strain Interventions -- Intervention Not Indicated  Physical Activity Interventions -- Intervention Not Indicated  Stress Interventions -- Intervention Not Indicated  Social Connections Interventions -- Intervention Not Indicated       Medication Assistance: None required.  Patient affirms current coverage meets  needs.  Medication Access: Within the past 30 days, how often has patient missed a dose of medication? 0 Is a pillbox or other method used to improve adherence? Yes  Factors that may affect medication adherence? no barriers identified Are meds synced by current pharmacy? No  Are meds delivered by current pharmacy? No  Does patient experience delays in picking up medications due to transportation concerns? No   Upstream Services Reviewed: Is patient disadvantaged to use [facility]?: No  Current Rx insurance plan: [facility] Name and [facility] of [facility] [phone] [facility] [address] [place] [address] Phone: [phone] Fax: [phone]  [facility] Pharmacy services reviewed with patient today?: No  Patient requests to transfer care to [facility] Pharmacy?: No  Reason patient declined to change pharmacies: Not mentioned at this visit  Compliance/Adherence/Medication fill history: Care Gaps: DEXA (never done)  Star-Rating Drugs: Atorvastatin - PDC 100% Lisinopril - PDC 89%   ASSESSMENT / PLAN  Heart Failure (Goal: manage symptoms and prevent exacerbations) -Controlled - pt does not need furosemide often -Current home BP/HR readings: n/a -Current home daily weights: n/a -Last ejection fraction: 55-60% (Date: 07/2018) - improved from 30-35% -HF type: HFimpEF (EF improved from <40% to > 40%) -NYHA Class: I (no actitivty limitation) -AHA HF Stage: C (Heart disease and symptoms present) -Current treatment: Furosemide 20 mg PRN - Appropriate, Effective, Safe, Accessible Lisinopril 5 mg daily -Appropriate, Effective, Safe, Accessible Metoprolol succinate 25 mg - 1/2 tab daily -Appropriate, Effective, Safe, Accessible -Medications previously tried: n/a  -Educated on Benefits of medications for managing symptoms and prolonging life; Importance of blood pressure  control -Recommended to continue current medication  Atrial  Fibrillation (Goal: prevent stroke and major bleeding) -Controlled - pt denies symptoms -CHADSVASC: 4; isolated Afib around time of MI -Current treatment: Metoprolol succinate 25 mg - 1/2 tab daily -Appropriate, Effective, Safe, Accessible Eliquis 5 mg BID -Appropriate, Effective, Safe, Accessible -Medications previously tried: n/a -Counseled on bleeding risk associated with Eliquis and importance of self-monitoring for signs/symptoms of bleeding; avoidance of NSAIDs due to increased bleeding risk with anticoagulants; seeking medical attention after a head injury or if there is blood in the urine/stool; -Recommended to continue current medication  Hyperlipidemia / CAD (LDL goal < 70) -Controlled - LDL 56 (05/2021) at goal -Hx STEMI 03/2018 -Current treatment: Atorvastatin 80 mg daily -Appropriate, Effective, Safe, Accessible NTG 0.4 mg SL prn -not used -Medications previously tried: n/a  -Educated on Cholesterol goals;  -Recommended to continue current medication  GERD / PUD (Goal: minimize symptoms of reflux ) -Not ideally controlled - pt reports reflux symptoms at night and GI upset after taking medications -GI consult 2022 - workup largely WNL, EGD showed gastritis. Try lactose-free diet, avoid red meat, processed foods, fatty foods  -Current treatment  Pantoprazole 40 mg daily - Appropriate,Query Effective -Medications previously tried: none reported  -Discussed optimal timing of PPI is ~1 hr before food;  -Advised to move pantoprazole to 1 hour before breakfast;  -advised to move all other medications to after breakfast and Vitamins after dinner -Advised she can take OTC Pepcid 10-15 min prior to food PRN, and can take Tums PRN for relief of symptoms  Depression/Anxiety (Goal: improve anxiety) -Uncontrolled - pt reports multiple stressors in her life (caring for husband and daughter) and struggling with worsening anxiety lately; she did previously take Lexapro and stopped due to  headaches when dose increased from 5 to 10 mg; she has also recently been referred to psychology -PHQ9: 2 (02/2022) -GAD7: 1 (08/2021) -Connected with PCP for mental health support -Current treatment: None -Medications previously tried/failed: escitalopram 10 mg (headaches) -Educated on Benefits of cognitive-behavioral therapy with or without medication -Discussed SSRI side effects often resolve within a week or 2 of initiation, it may be worth re-trial -Re-start escitalopram 5 mg daily; contact provider with any issues  Health Maintenance -Vaccine gaps: Pneumovax or Prevnar20; TDAP; Shingrix    Al Corpus, PharmD, Patsy Baltimore, CPP Clinical Pharmacist Practitioner [facility] [phone]

## 2022-06-29 DIAGNOSIS — I255 Ischemic cardiomyopathy: Secondary | ICD-10-CM | POA: Diagnosis not present

## 2022-06-29 DIAGNOSIS — I252 Old myocardial infarction: Secondary | ICD-10-CM | POA: Diagnosis not present

## 2022-06-29 DIAGNOSIS — I25118 Atherosclerotic heart disease of native coronary artery with other forms of angina pectoris: Secondary | ICD-10-CM | POA: Diagnosis not present

## 2022-07-03 ENCOUNTER — Emergency Department: Payer: Medicare Other

## 2022-07-03 ENCOUNTER — Other Ambulatory Visit: Payer: Self-pay

## 2022-07-03 ENCOUNTER — Emergency Department
Admission: EM | Admit: 2022-07-03 | Discharge: 2022-07-03 | Disposition: A | Discharge status: Home / Self Care | Payer: Medicare Other | Attending: Emergency Medicine | Admitting: Emergency Medicine

## 2022-07-03 ENCOUNTER — Encounter: Payer: Self-pay | Admitting: Emergency Medicine

## 2022-07-03 DIAGNOSIS — S134XXA Sprain of ligaments of cervical spine, initial encounter: Secondary | ICD-10-CM | POA: Insufficient documentation

## 2022-07-03 DIAGNOSIS — S0990XA Unspecified injury of head, initial encounter: Secondary | ICD-10-CM | POA: Diagnosis not present

## 2022-07-03 DIAGNOSIS — M542 Cervicalgia: Secondary | ICD-10-CM | POA: Diagnosis present

## 2022-07-03 DIAGNOSIS — R519 Headache, unspecified: Secondary | ICD-10-CM | POA: Diagnosis not present

## 2022-07-03 DIAGNOSIS — W541XXA Struck by dog, initial encounter: Secondary | ICD-10-CM | POA: Insufficient documentation

## 2022-07-03 DIAGNOSIS — S199XXA Unspecified injury of neck, initial encounter: Secondary | ICD-10-CM | POA: Diagnosis not present

## 2022-07-03 DIAGNOSIS — I10 Essential (primary) hypertension: Secondary | ICD-10-CM | POA: Diagnosis not present

## 2022-07-03 DIAGNOSIS — S139XXA Sprain of joints and ligaments of unspecified parts of neck, initial encounter: Secondary | ICD-10-CM

## 2022-07-03 MED ORDER — OXYCODONE HCL 5 MG PO TABS: 5.0000 mg | ORAL_TABLET | Freq: Four times a day (QID) | ORAL | 0 refills | Status: DC | PRN

## 2022-07-03 MED ORDER — BACLOFEN 10 MG PO TABS: 10.0000 mg | ORAL_TABLET | Freq: Three times a day (TID) | ORAL | 0 refills | Status: AC

## 2022-07-03 MED ORDER — OXYCODONE-ACETAMINOPHEN 5-325 MG PO TABS
1.0000 | ORAL_TABLET | Freq: Once | ORAL | Status: AC
  Administered 2022-07-03: 1 via ORAL
  Filled 2022-07-03: qty 1

## 2022-07-03 MED ORDER — METHYLPREDNISOLONE 4 MG PO TBPK: ORAL_TABLET | ORAL | 0 refills | Status: DC

## 2022-07-03 NOTE — ED Provider Notes (Signed)
[facility] Provider Note    Event Date/Time   First MD Initiated Contact with Patient 07/03/22 1104     (approximate)   History   Headache and Neck Pain   HPI  Joanne Clark is a 67 y.o. female with history of MI, hypertension, hyperlipidemia, headaches and gastric ulcers presents emergency department with complaints of neck pain after an injury this morning.  Patient states that 2 of her 20 pound dog jumped onto her while she was sleeping and caused her to have severe pain in her neck.  Patient states some painful swallowing, numbness and tingling into the left arm, pain with movement.  No chest pain/shortness of breath.  No other injuries reported.  Patient is also complaining of headache      Physical Exam   Triage Vital Signs: ED Triage Vitals [07/03/22 1039]  Enc Vitals Group     BP (!) 143/81     Pulse Rate 73     Resp 16     Temp 98 F (36.7 C)     Temp Source Oral     SpO2 98 %     Weight 226 lb (102.5 kg)     Height 5\' 8"  (1.727 m)     Head Circumference      Peak Flow      Pain Score 3     Pain Loc      Pain Edu?      Excl. in GC?     Most recent vital signs: Vitals:   07/03/22 1039 07/03/22 1238  BP: (!) 143/81 134/78  Pulse: 73 70  Resp: 16 16  Temp: 98 F (36.7 C)   SpO2: 98% 98%     General: Awake, no distress.   CV:  Good peripheral perfusion. regular rate and  rhythm Resp:  Normal effort.  Abd:  No distention.   Other:  C-spine is tender, grips are equal bilaterally, spasms noted in left trapezius, patient is very tearful   ED Results / Procedures / Treatments   Labs (all labs ordered are listed, but only abnormal results are displayed) Labs Reviewed - No data to display   EKG     RADIOLOGY CT of the head and cervical spine    PROCEDURES:   Procedures   MEDICATIONS ORDERED IN ED: Medications  oxyCODONE-acetaminophen (PERCOCET/ROXICET) 5-325 MG per tablet 1 tablet (1 tablet Oral Given  07/03/22 1133)     IMPRESSION / MDM / ASSESSMENT AND PLAN / ED COURSE  I reviewed the triage vital signs and the nursing notes.                              Differential diagnosis includes, but is not limited to, cervical fracture, contusion, bulging disc, subdural, SAH  Patient's presentation is most consistent with acute presentation with potential threat to life or bodily function.   Due to the patient's complaints of difficulty swallowing after a neck injury along with numbness and tingling I placed her in a c-collar.  Will do CT of the head and cervical spine due to the headache associated with the neck pain and difficulty swallowing.  CT head and cervical spine ordered Percocet ordered for pain   I did individually review and interpret the radiologist reading for Ct head and C spine and I interpret this as being negative for any acute abnormality  C collar was removed, patient was given a rx for  medrol dose pack, baclofen and oxycodone.  She is to follow up with orthopedics if not improving in a few days.  REturn to the Er if worsening.  Patient is in agreement with the treatment plan and was discharged in stable condition   FINAL CLINICAL IMPRESSION(S) / ED DIAGNOSES   Final diagnoses:  Bad headache  Acute cervical sprain, initial encounter     Rx / DC Orders   ED Discharge Orders          Ordered    methylPREDNISolone (MEDROL DOSEPAK) 4 MG TBPK tablet        07/03/22 1223    baclofen (LIORESAL) 10 MG tablet  3 times daily        07/03/22 1223    oxyCODONE (ROXICODONE) 5 MG immediate release tablet  Every 6 hours PRN        07/03/22 1223             Note:  This document was prepared using Dragon voice recognition software and may include unintentional dictation errors.    Faythe Ghee, PA-C 07/03/22 1249    Georga Hacking, MD 07/03/22 [phone]

## 2022-07-03 NOTE — ED Triage Notes (Signed)
Pt to ED via POV. Pt states that she was asleep this morning and her 2 dogs jump on her and hit her neck, the side of her head, and her left shoulder. Pt states that she is having a lot of pain in the area. Pt states that she was feeling short of breath shortly after it happened but states that she think this was due to hyperventilating. Pt is tearful in triage but in NAD.

## 2022-07-07 ENCOUNTER — Telehealth: Payer: Self-pay

## 2022-07-07 NOTE — Telephone Encounter (Signed)
Transition Care Management Unsuccessful Follow-up Telephone Call  Date of discharge and from where:  [place] 6/16  Attempts:  1st Attempt  Reason for unsuccessful TCM follow-up call:  Left voice message   Junell Cullifer [facility] Guide, [place] [phone] [address], [place], [place] [address] Phone: [phone] Email: [email or web address]       

## 2022-07-08 ENCOUNTER — Telehealth: Payer: Self-pay

## 2022-07-08 NOTE — Telephone Encounter (Signed)
Transition Care Management Unsuccessful Follow-up Telephone Call  Date of discharge and from where:  [place] 6/16  Attempts:  2nd Attempt  Reason for unsuccessful TCM follow-up call:  Left voice message   Ina Scrivens [facility] Care Guide, [place] [phone] [address], [place], [place] [address] Phone: [phone] Email: [email or web address]       

## 2022-07-11 ENCOUNTER — Ambulatory Visit (INDEPENDENT_AMBULATORY_CARE_PROVIDER_SITE_OTHER): Payer: Medicare Other | Admitting: Internal Medicine

## 2022-07-11 ENCOUNTER — Encounter: Payer: Self-pay | Admitting: Internal Medicine

## 2022-07-11 VITALS — BP 114/80 | HR 74 | Temp 97.2°F | Ht 67.0 in | Wt 228.0 lb

## 2022-07-11 DIAGNOSIS — M79672 Pain in left foot: Secondary | ICD-10-CM | POA: Diagnosis not present

## 2022-07-11 DIAGNOSIS — M79671 Pain in right foot: Secondary | ICD-10-CM | POA: Diagnosis not present

## 2022-07-11 NOTE — Progress Notes (Signed)
Subjective:    Patient ID: Joanne Clark, female    DOB: 1955-03-18, 67 y.o.   MRN: [id number]  HPI Here due to foot pain  Started with foot pains about a month ago Thought it was from walking on trail at [employer] this--but the pain has persisted  Told she has bone spurs on right---by podiatrist some time ago Now has to even lean on something just standing up  Pain in heels --but also the whole bottom of her feet Left is not as bad Hears some popping when she stands Pain doesn't really ease up after being on them  Current Outpatient Medications on File Prior to Visit  Medication Sig Dispense Refill   apixaban (ELIQUIS) 5 MG TABS tablet Take 1 tablet by mouth 2 (two) times daily. cardio     atorvastatin (LIPITOR) 80 MG tablet Take 1 tablet (80 mg total) by mouth daily. For cholesterol. Will need to be seen in office for more refills. 90 tablet 0   escitalopram (LEXAPRO) 5 MG tablet Take 5 mg by mouth daily.     lisinopril (ZESTRIL) 5 MG tablet Take 1 tablet (5 mg total) by mouth daily. cardio 90 tablet 0   nitroGLYCERIN (NITROSTAT) 0.4 MG SL tablet Place under the tongue. cardio     oxyCODONE (ROXICODONE) 5 MG immediate release tablet Take 1 tablet (5 mg total) by mouth every 6 (six) hours as needed. 15 tablet 0   pantoprazole (PROTONIX) 40 MG tablet Take 1 tablet (40 mg total) by mouth daily. 90 tablet 1   furosemide (LASIX) 20 MG tablet Take 20 mg by mouth as needed. cardio     metoprolol succinate (TOPROL-XL) 25 MG 24 hr tablet Take 12.5 mg by mouth daily. cardio     No current facility-administered medications on file prior to visit.    Allergies  Allergen Reactions   Gluten Meal     Other reaction(s): GI Reaction   Methylprednisolone Other (See Comments)    Injectable form: Muscle spasms    Xyzal [Levocetirizine] Other (See Comments)    Causes nose bleeds    Past Medical History:  Diagnosis Date   Depression    GERD (gastroesophageal reflux  disease)    Headache    Hiatal hernia    Hyperlipemia    Hypertension    Multiple gastric ulcers    Myocardial infarction [facility])    STEMI (ST elevation myocardial infarction) (HCC) 04/10/2018    Past Surgical History:  Procedure Laterality Date   CHOLECYSTECTOMY  2010   COLONOSCOPY W/ ENDOSCOPIC [place]  10/17/2017   ESOPHAGOGASTRODUODENOSCOPY (EGD) WITH PROPOFOL N/A 04/28/2020   Procedure: ESOPHAGOGASTRODUODENOSCOPY (EGD) WITH PROPOFOL;  Surgeon: Toney Reil, MD;  Location: [facility] ENDOSCOPY;  Service: Gastroenterology;  Laterality: N/A;    Family History  Problem Relation Age of Onset   Dementia Mother    Diabetes Mother    Dementia Father    Diabetes Father    Heart disease Father    Cancer Sister        unknown type   Diabetes Sister    Hyperlipidemia Sister    Cervical cancer Sister    Diabetes Brother     Social History   Socioeconomic History   Marital status: Married    Spouse name: Fayrene Fearing   Number of children: 1   Years of education: some college   Highest education level: Not on file  Occupational History   Not on file  Tobacco Use   Smoking status:  Never   Smokeless tobacco: Never  Vaping Use   Vaping Use: Never used  Substance and Sexual Activity   Alcohol use: No   Drug use: No   Sexual activity: Not Currently  Other Topics Concern   Not on file  Social History Narrative   01/16/20   From: [place] originally, moved 2021 to be near daughter   Living: with husband, Fayrene Fearing [phone])   Work: retired Occupational hygienist      Family: daughter Baxter Hire      Enjoys: reading, games, walking the park       Exercise: walking 2-3 times a week   Diet: tires to do heart healthy diet      Safety   Seat belts: Yes    Guns: Yes  and secure   Safe in relationships: Yes    Social Determinants of Health   Financial Resource Strain: Low Risk  (06/02/2021)   Overall Financial Resource Strain (CARDIA)    Difficulty of Paying Living Expenses: Not hard at all  Food  Insecurity: No Food Insecurity (06/02/2021)   Hunger Vital Sign    Worried About Running Out of Food in the Last Year: Never true    Ran Out of Food in the Last Year: Never true  Transportation Needs: No Transportation Needs (06/02/2021)   PRAPARE - Administrator, Civil Service (Medical): No    Lack of Transportation (Non-Medical): No  Physical Activity: Insufficiently Active (06/02/2021)   Exercise Vital Sign    Days of Exercise per Week: 3 days    Minutes of Exercise per Session: 30 min  Stress: No Stress Concern Present (06/02/2021)   [employer] of Occupational Health - Occupational Stress Questionnaire    Feeling of Stress : Only a little  Social Connections: Moderately Isolated (06/02/2021)   Social Connection and Isolation Panel [NHANES]    Frequency of Communication with Friends and Family: More than three times a week    Frequency of Social Gatherings with Friends and Family: Once a week    Attends Religious Services: Never    Database administrator or Organizations: No    Attends Banker Meetings: Never    Marital Status: Married  Catering manager Violence: Not At Risk (06/02/2021)   Humiliation, Afraid, Rape, and Kick questionnaire    Fear of Current or Ex-Partner: No    Emotionally Abused: No    Physically Abused: No    Sexually Abused: No   Review of Systems More beef lately--but no sig change in diet otherwise Husband has gout    Objective:   Physical Exam Cardiovascular:     Pulses: Normal pulses.  Musculoskeletal:     Comments: No apparent leg length discrepancy Left foot arch higher than right No ankle/foot tenderness  Neurological:     Comments: Normal gait and strength            Assessment & Plan:

## 2022-07-11 NOTE — Assessment & Plan Note (Signed)
Seems to have normal circulation Mechanical?? Will set up with Dr Patsy Lager

## 2022-07-12 NOTE — Progress Notes (Signed)
Joanne Simson T. Willmar Stockinger, MD, CAQ Sports Medicine [facility] at [facility] [address] [place], [address]  Phone: [phone]  FAX: [phone]  Joanne Clark - 67 y.o. female  MRN [id number]  Date of Birth: 1955-02-07  Date: 07/14/2022  PCP: Eden Emms, NP  Referral: Eden Emms, NP  Chief Complaint  Patient presents with   Foot Pain    Bilateral-Right is worse x 6 months   Subjective:   Joanne Clark is a 67 y.o. very pleasant female patient with Body mass index is 35.71 kg/m. who presents with the following:  She is a pleasant 67 year old patient seen courtesy of Dr. Alphonsus Sias for evaluation of some bilateral ongoing foot pain.  She is complaining of plantar surface pain, worst at the calcaneal insertion of the plantar fascia.  Her right is significantly worse than the left, though both hurt her.  Last week had another bad bout.  Hurts with standing up first thing in the morning.  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   In the last 6 months, never knows when it will happen.  It is somewhat variable. No swelling, red, hot.  No history of gout or pseudogout.  Heel , top and bottom, seems to be everywhere.  She also has some pain in the arch and on the undersurface and plantar surface of the foot more globally.   Walked in the park, then flared up, but other days.   Flares will last for days, sometimes only during the day.   No prior interventions.     Review of Systems is noted in the HPI, as appropriate  Objective:   BP 110/70 (BP Location: Left Arm, Patient Position: Sitting, Cuff Size: Large)   Pulse (!) 103   Temp 98 F (36.7 C) (Temporal)   Ht 5\' 7"  (1.702 m)   Wt 228 lb (103.4 kg)   SpO2 96%   BMI 35.71 kg/m   GEN: No acute distress; alert,appropriate. PULM: Breathing comfortably in no respiratory distress PSYCH: Normally interactive.    Foot exam: B Echymosis:  no Edema: no ROM: full LE B Gait: heel toe, non-antalgic MT pain: no Callus pattern: none Lateral Mall: NT Medial Mall: NT Talus: NT Navicular: NT Calcaneous: NT Metatarsals: NT 5th MT: NT Phalanges: NT Achilles: NT Plantar Fascia: tender, medial along PF. Pain with forced dorsi.  Pain more across the entirety of the plantar fascia, concentrating at the calcaneal insertion Fat Pad: NT Peroneals: NT Post Tib: NT Great Toe: Nml motion Ant Drawer: neg Other foot breakdown: none Long arch: preserved Transverse arch: preserved Hindfoot breakdown: none Sensation: intact   Laboratory and Imaging Data:  Assessment and Plan:     ICD-10-CM   1. Plantar fasciitis, bilateral  M72.2     2. Bilateral foot pain  M79.671    M79.672      We reviewed that stretching is critically important to the treatment of PF.  Reviewed footwear. Rigid soles have been shown to help with PF. Night splints can sometimes help. Reviewed rehab of stretching and calf raises.   Could benefit from a corticosteroid injection, orthotics, or other measures if conservative treatment fails.   Social: Right now her foot is limiting her ability to walk for exercise  Patient Instructions  Please read handouts on Plantar Fascitis.  STRETCHING and Strengthening program critically important.  Strengthening on foot and calf muscles as seen  in handout. Calf raises, 2 legged, then 1 legged. Foot massage with tennis ball. Ice massage.  Towel Scrunches: get a towel or hand towel, use toes to pick up and scrunch up the towel.  Marble pick-ups, practice picking up marbles with toes and placing into a cup  NEEDS TO BE DONE EVERY DAY  Recommended over the counter insoles. (Spenco or Hapad)  A rigid shoe with good arch support helps: Dansko (great), Randel Pigg, Merrell No easily bendable shoes.   Tuli's heel cups    Medication Management during today's office visit: No orders of the defined types were placed in  this encounter.  There are no discontinued medications.  Orders placed today for conditions managed today: No orders of the defined types were placed in this encounter.   Disposition: No follow-ups on file.  Dragon [employer] One speech-to-text software was used for transcription in this dictation.  Possible transcriptional errors can occur using Animal nutritionist.   Signed,  Elpidio Galea. Ahaan Zobrist, MD   Outpatient Encounter Medications as of 07/14/2022  Medication Sig   apixaban (ELIQUIS) 5 MG TABS tablet Take 1 tablet by mouth 2 (two) times daily. cardio   atorvastatin (LIPITOR) 80 MG tablet Take 1 tablet (80 mg total) by mouth daily. For cholesterol. Will need to be seen in office for more refills.   escitalopram (LEXAPRO) 5 MG tablet Take 5 mg by mouth daily.   furosemide (LASIX) 20 MG tablet Take 20 mg by mouth as needed. cardio   lisinopril (ZESTRIL) 5 MG tablet Take 1 tablet (5 mg total) by mouth daily. cardio   metoprolol succinate (TOPROL-XL) 25 MG 24 hr tablet Take 12.5 mg by mouth daily. cardio   nitroGLYCERIN (NITROSTAT) 0.4 MG SL tablet Place under the tongue. cardio   oxyCODONE (ROXICODONE) 5 MG immediate release tablet Take 1 tablet (5 mg total) by mouth every 6 (six) hours as needed.   pantoprazole (PROTONIX) 40 MG tablet Take 1 tablet (40 mg total) by mouth daily.   No facility-administered encounter medications on file as of 07/14/2022.

## 2022-07-13 ENCOUNTER — Ambulatory Visit (INDEPENDENT_AMBULATORY_CARE_PROVIDER_SITE_OTHER): Payer: Medicare Other

## 2022-07-13 VITALS — Ht 68.0 in | Wt 225.0 lb

## 2022-07-13 DIAGNOSIS — Z0000 Encounter for general adult medical examination without abnormal findings: Secondary | ICD-10-CM

## 2022-07-13 NOTE — Patient Instructions (Addendum)
[name] , Thank you for taking time to come for your Medicare Wellness Visit. I appreciate your ongoing commitment to your health goals. Please review the following plan we discussed and let me know if I can assist you in the future.   These are the goals we discussed:  Goals      DIET - EAT MORE FRUITS AND VEGETABLES     Increase physical activity     Recommend increasing physical activity to at least 3 days per week     Patient Stated     Drink 6-8 glasses of water , exercise more.        This is a list of the screening recommended for you and due dates:  Health Maintenance  Topic Date Due   DEXA scan (bone density measurement)  Never done   Pneumonia Vaccine (2 of 2 - PPSV23 or PCV20) 04/13/2019   DTaP/Tdap/Td vaccine (2 - Td or Tdap) 06/22/2020   Zoster (Shingles) Vaccine (2 of 2) 08/23/2021   COVID-19 Vaccine (6 - 2023-24 season) 09/17/2021   Hepatitis C Screening  12/04/2022*   Flu Shot  08/18/2022   Mammogram  10/22/2022   Medicare Annual Wellness Visit  07/13/2023   Colon Cancer Screening  10/18/2027   HPV Vaccine  Aged Out  *Topic was postponed. The date shown is not the original due date.   Managing Pain Without Opioids Opioids are strong medicines used to treat moderate to severe pain. For some people, especially those who have long-term (chronic) pain, opioids may not be the best choice for pain management due to: Side effects like nausea, constipation, and sleepiness. The risk of addiction (opioid use disorder). The longer you take opioids, the greater your risk of addiction. Pain that lasts for more than 3 months is called chronic pain. Managing chronic pain usually requires more than one approach and is often provided by a team of health care providers working together (multidisciplinary approach). Pain management may be done at a pain management center or pain clinic. How to manage pain without the use of opioids Use non-opioid medicines Non-opioid  medicines for pain may include: Over-the-counter or prescription non-steroidal anti-inflammatory drugs (NSAIDs). These may be the first medicines used for pain. They work well for muscle and bone pain, and they reduce swelling. Acetaminophen. This over-the-counter medicine may work well for milder pain but not swelling. Antidepressants. These may be used to treat chronic pain. A certain type of antidepressant (tricyclics) is often used. These medicines are given in lower doses for pain than when used for depression. Anticonvulsants. These are usually used to treat seizures but may also reduce nerve (neuropathic) pain. Muscle relaxants. These relieve pain caused by sudden muscle tightening (spasms). You may also use a pain medicine that is applied to the skin as a patch, cream, or gel (topical analgesic), such as a numbing medicine. These may cause fewer side effects than medicines taken by mouth. Do certain therapies as directed Some therapies can help with pain management. They include: Physical therapy. You will do exercises to gain strength and flexibility. A physical therapist may teach you exercises to move and stretch parts of your body that are weak, stiff, or painful. You can learn these exercises at physical therapy visits and practice them at home. Physical therapy may also involve: Massage. Heat wraps or applying heat or cold to affected areas. Electrical signals that interrupt pain signals (transcutaneous electrical nerve stimulation, TENS). Weak lasers that reduce pain and swelling (low-level laser therapy).  Signals from your body that help you learn to regulate pain (biofeedback). Occupational therapy. This helps you to learn ways to function at home and work with less pain. Recreational therapy. This involves trying new activities or hobbies, such as a physical activity or drawing. Mental health therapy, including: Cognitive behavioral therapy (CBT). This helps you learn coping  skills for dealing with pain. Acceptance and commitment therapy (ACT) to change the way you think and react to pain. Relaxation therapies, including muscle relaxation exercises and mindfulness-based stress reduction. Pain management counseling. This may be individual, family, or group counseling.  Receive medical treatments Medical treatments for pain management include: Nerve block injections. These may include a pain blocker and anti-inflammatory medicines. You may have injections: Near the spine to relieve chronic back or neck pain. Into joints to relieve back or joint pain. Into nerve areas that supply a painful area to relieve body pain. Into muscles (trigger point injections) to relieve some painful muscle conditions. A medical device placed near your spine to help block pain signals and relieve nerve pain or chronic back pain (spinal cord stimulation device). Acupuncture. Follow these instructions at home Medicines Take over-the-counter and prescription medicines only as told by your health care provider. If you are taking pain medicine, ask your health care providers about possible side effects to watch out for. Do not drive or use heavy machinery while taking prescription opioid pain medicine. Lifestyle  Do not use drugs or alcohol to reduce pain. If you drink alcohol, limit how much you have to: 0-1 drink a day for women who are not pregnant. 0-2 drinks a day for men. Know how much alcohol is in a drink. In the [place] one drink equals one 12 oz bottle of beer (355 mL), one 5 oz glass of wine (148 mL), or one 1 oz glass of hard liquor (44 mL). Do not use any products that contain nicotine or tobacco. These products include cigarettes, chewing tobacco, and vaping devices, such as e-cigarettes. If you need help quitting, ask your health care provider. Eat a healthy diet and maintain a healthy weight. Poor diet and excess weight may make pain worse. Eat foods that are high in fiber.  These include fresh fruits and vegetables, whole grains, and beans. Limit foods that are high in fat and processed sugars, such as fried and sweet foods. Exercise regularly. Exercise lowers stress and may help relieve pain. Ask your health care provider what activities and exercises are safe for you. If your health care provider approves, join an exercise class that combines movement and stress reduction. Examples include yoga and tai chi. Get enough sleep. Lack of sleep may make pain worse. Lower stress as much as possible. Practice stress reduction techniques as told by your therapist. General instructions Work with all your pain management providers to find the treatments that work best for you. You are an important member of your pain management team. There are many things you can do to reduce pain on your own. Consider joining an online or in-person support group for people who have chronic pain. Keep all follow-up visits. This is important. Where to find more information You can find more information about managing pain without opioids from: American Academy of Pain Medicine: painmed.org Institute for Chronic Pain: instituteforchronicpain.org American Chronic Pain Association: theacpa.org Contact a health care provider if: You have side effects from pain medicine. Your pain gets worse or does not get better with treatments or home therapy. You are struggling with anxiety  or depression. Summary Many types of pain can be managed without opioids. Chronic pain may respond better to pain management without opioids. Pain is best managed when you and a team of health care providers work together. Pain management without opioids may include non-opioid medicines, medical treatments, physical therapy, mental health therapy, and lifestyle changes. Tell your health care providers if your pain gets worse or is not being managed well enough. This information is not intended to replace advice given  to you by your health care provider. Make sure you discuss any questions you have with your health care provider. Document Revised: 04/15/2020 Document Reviewed: 04/15/2020 Elsevier Patient Education  2024 Elsevier Inc.   Advanced directives: none  Conditions/risks identified: Aim for 30 minutes of exercise or brisk walking, 6-8 glasses of water, and 5 servings of fruits and vegetables each day.   Next appointment: Follow up in one year for your annual wellness visit 07/17/23 @ 1pm telephone visit   Preventive Care 65 Years and Older, Female Preventive care refers to lifestyle choices and visits with your health care provider that can promote health and wellness. What does preventive care include? A yearly physical exam. This is also called an annual well check. Dental exams once or twice a year. Routine eye exams. Ask your health care provider how often you should have your eyes checked. Personal lifestyle choices, including: Daily care of your teeth and gums. Regular physical activity. Eating a healthy diet. Avoiding tobacco and drug use. Limiting alcohol use. Practicing safe sex. Taking low-dose aspirin every day. Taking vitamin and mineral supplements as recommended by your health care provider. What happens during an annual well check? The services and screenings done by your health care provider during your annual well check will depend on your age, overall health, lifestyle risk factors, and family history of disease. Counseling  Your health care provider may ask you questions about your: Alcohol use. Tobacco use. Drug use. Emotional well-being. Home and relationship well-being. Sexual activity. Eating habits. History of falls. Memory and ability to understand (cognition). Work and work Astronomer. Reproductive health. Screening  You may have the following tests or measurements: Height, weight, and BMI. Blood pressure. Lipid and cholesterol levels. These may be  checked every 5 years, or more frequently if you are over 39 years old. Skin check. Lung cancer screening. You may have this screening every year starting at age 37 if you have a 30-pack-year history of smoking and currently smoke or have quit within the past 15 years. Fecal occult blood test (FOBT) of the stool. You may have this test every year starting at age 75. Flexible sigmoidoscopy or colonoscopy. You may have a sigmoidoscopy every 5 years or a colonoscopy every 10 years starting at age 78. Hepatitis C blood test. Hepatitis B blood test. Sexually transmitted disease (STD) testing. Diabetes screening. This is done by checking your blood sugar (glucose) after you have not eaten for a while (fasting). You may have this done every 1-3 years. Bone density scan. This is done to screen for osteoporosis. You may have this done starting at age 40. Mammogram. This may be done every 1-2 years. Talk to your health care provider about how often you should have regular mammograms. Talk with your health care provider about your test results, treatment options, and if necessary, the need for more tests. Vaccines  Your health care provider may recommend certain vaccines, such as: Influenza vaccine. This is recommended every year. Tetanus, diphtheria, and acellular pertussis (Tdap, Td)  vaccine. You may need a Td booster every 10 years. Zoster vaccine. You may need this after age 17. Pneumococcal 13-valent conjugate (PCV13) vaccine. One dose is recommended after age 69. Pneumococcal polysaccharide (PPSV23) vaccine. One dose is recommended after age 40. Talk to your health care provider about which screenings and vaccines you need and how often you need them. This information is not intended to replace advice given to you by your health care provider. Make sure you discuss any questions you have with your health care provider. Document Released: 01/30/2015 Document Revised: 09/23/2015 Document Reviewed:  11/04/2014 Elsevier Interactive Patient Education  2017 [employer].  Fall Prevention in the Home Falls can cause injuries. They can happen to people of all ages. There are many things you can do to make your home safe and to help prevent falls. What can I do on the outside of my home? Regularly fix the edges of walkways and driveways and fix any cracks. Remove anything that might make you trip as you walk through a door, such as a raised step or threshold. Trim any bushes or trees on the path to your home. Use bright outdoor lighting. Clear any walking paths of anything that might make someone trip, such as rocks or tools. Regularly check to see if handrails are loose or broken. Make sure that both sides of any steps have handrails. Any raised decks and porches should have guardrails on the edges. Have any leaves, snow, or ice cleared regularly. Use sand or salt on walking paths during winter. Clean up any spills in your garage right away. This includes oil or grease spills. What can I do in the bathroom? Use night lights. Install grab bars by the toilet and in the tub and shower. Do not use towel bars as grab bars. Use non-skid mats or decals in the tub or shower. If you need to sit down in the shower, use a plastic, non-slip stool. Keep the floor dry. Clean up any water that spills on the floor as soon as it happens. Remove soap buildup in the tub or shower regularly. Attach bath mats securely with double-sided non-slip rug tape. Do not have throw rugs and other things on the floor that can make you trip. What can I do in the bedroom? Use night lights. Make sure that you have a light by your bed that is easy to reach. Do not use any sheets or blankets that are too big for your bed. They should not hang down onto the floor. Have a firm chair that has side arms. You can use this for support while you get dressed. Do not have throw rugs and other things on the floor that can make you  trip. What can I do in the kitchen? Clean up any spills right away. Avoid walking on wet floors. Keep items that you use a lot in easy-to-reach places. If you need to reach something above you, use a strong step stool that has a grab bar. Keep electrical cords out of the way. Do not use floor polish or wax that makes floors slippery. If you must use wax, use non-skid floor wax. Do not have throw rugs and other things on the floor that can make you trip. What can I do with my stairs? Do not leave any items on the stairs. Make sure that there are handrails on both sides of the stairs and use them. Fix handrails that are broken or loose. Make sure that handrails are as long  as the stairways. Check any carpeting to make sure that it is firmly attached to the stairs. Fix any carpet that is loose or worn. Avoid having throw rugs at the top or bottom of the stairs. If you do have throw rugs, attach them to the floor with carpet tape. Make sure that you have a light switch at the top of the stairs and the bottom of the stairs. If you do not have them, ask someone to add them for you. What else can I do to help prevent falls? Wear shoes that: Do not have high heels. Have rubber bottoms. Are comfortable and fit you well. Are closed at the toe. Do not wear sandals. If you use a stepladder: Make sure that it is fully opened. Do not climb a closed stepladder. Make sure that both sides of the stepladder are locked into place. Ask someone to hold it for you, if possible. Clearly mark and make sure that you can see: Any grab bars or handrails. First and last steps. Where the edge of each step is. Use tools that help you move around (mobility aids) if they are needed. These include: Canes. Walkers. Scooters. Crutches. Turn on the lights when you go into a dark area. Replace any light bulbs as soon as they burn out. Set up your furniture so you have a clear path. Avoid moving your furniture  around. If any of your floors are uneven, fix them. If there are any pets around you, be aware of where they are. Review your medicines with your doctor. Some medicines can make you feel dizzy. This can increase your chance of falling. Ask your doctor what other things that you can do to help prevent falls. This information is not intended to replace advice given to you by your health care provider. Make sure you discuss any questions you have with your health care provider. Document Released: 10/30/2008 Document Revised: 06/11/2015 Document Reviewed: 02/07/2014 Elsevier Interactive Patient Education  2017 [employer].

## 2022-07-13 NOTE — Progress Notes (Signed)
Subjective:   Joanne Clark is a 67 y.o. female who presents for Medicare Annual (Subsequent) preventive examination.  Visit Complete: Virtual  I connected with  Joanne Clark on 07/13/22 by a audio enabled telemedicine application and verified that I am speaking with the correct person using two identifiers.  Patient Location: Home  Provider Location: Office/Clinic  I discussed the limitations of evaluation and management by telemedicine. The patient expressed understanding and agreed to proceed.  Review of Systems      Cardiac Risk Factors include: advanced age (>28men, >42 women);dyslipidemia;sedentary lifestyle     Objective:    Today's Vitals   07/13/22 1301  Weight: 225 lb (102.1 kg)  Height: 5\' 8"  (1.727 m)   Body mass index is 34.21 kg/m.     07/13/2022    1:16 PM 07/03/2022   10:41 AM 06/02/2021    3:38 PM 01/22/2021    5:24 PM 04/28/2020    7:47 AM 04/06/2020   10:58 AM 08/21/2019    2:50 PM  Advanced Directives  Does Patient Have a Medical Advance Directive? No No No No No No No  Would patient like information on creating a medical advance directive? No - Patient declined  No - Patient declined   No - Patient declined No - Patient declined    Current Medications (verified) Outpatient Encounter Medications as of 07/13/2022  Medication Sig   apixaban (ELIQUIS) 5 MG TABS tablet Take 1 tablet by mouth 2 (two) times daily. cardio   atorvastatin (LIPITOR) 80 MG tablet Take 1 tablet (80 mg total) by mouth daily. For cholesterol. Will need to be seen in office for more refills.   escitalopram (LEXAPRO) 5 MG tablet Take 5 mg by mouth daily.   furosemide (LASIX) 20 MG tablet Take 20 mg by mouth as needed. cardio   lisinopril (ZESTRIL) 5 MG tablet Take 1 tablet (5 mg total) by mouth daily. cardio   metoprolol succinate (TOPROL-XL) 25 MG 24 hr tablet Take 12.5 mg by mouth daily. cardio   nitroGLYCERIN (NITROSTAT) 0.4 MG SL tablet Place under the tongue. cardio    pantoprazole (PROTONIX) 40 MG tablet Take 1 tablet (40 mg total) by mouth daily.   oxyCODONE (ROXICODONE) 5 MG immediate release tablet Take 1 tablet (5 mg total) by mouth every 6 (six) hours as needed. (Patient not taking: Reported on 07/13/2022)   No facility-administered encounter medications on file as of 07/13/2022.    Allergies (verified) Gluten meal, Methylprednisolone, and Xyzal [levocetirizine]   History: Past Medical History:  Diagnosis Date   Depression    GERD (gastroesophageal reflux disease)    Headache    Hiatal hernia    Hyperlipemia    Hypertension    Multiple gastric ulcers    Myocardial infarction [facility])    STEMI (ST elevation myocardial infarction) (HCC) 04/10/2018   Past Surgical History:  Procedure Laterality Date   CHOLECYSTECTOMY  2010   COLONOSCOPY W/ ENDOSCOPIC [place]  10/17/2017   ESOPHAGOGASTRODUODENOSCOPY (EGD) WITH PROPOFOL N/A 04/28/2020   Procedure: ESOPHAGOGASTRODUODENOSCOPY (EGD) WITH PROPOFOL;  Surgeon: Toney Reil, MD;  Location: [facility] ENDOSCOPY;  Service: Gastroenterology;  Laterality: N/A;   wisdon tooth extraction  2024   Family History  Problem Relation Age of Onset   Dementia Mother    Diabetes Mother    Dementia Father    Diabetes Father    Heart disease Father    Cancer Sister        unknown type   Diabetes Sister  Hyperlipidemia Sister    Cervical cancer Sister    Diabetes Brother    Social History   Socioeconomic History   Marital status: Married    Spouse name: Joanne Clark   Number of children: 1   Years of education: some college   Highest education level: Not on file  Occupational History   Not on file  Tobacco Use   Smoking status: Never   Smokeless tobacco: Never  Vaping Use   Vaping Use: Never used  Substance and Sexual Activity   Alcohol use: No   Drug use: No   Sexual activity: Not Currently  Other Topics Concern   Not on file  Social History Narrative   01/16/20   From: [place] originally, moved  2021 to be near daughter   Living: with husband, Joanne Clark [phone])   Work: retired Occupational hygienist      Family: daughter Joanne Clark      Enjoys: reading, games, walking the park       Exercise: walking 2-3 times a week   Diet: tires to do heart healthy diet      Safety   Seat belts: Yes    Guns: Yes  and secure   Safe in relationships: Yes    Social Determinants of Health   Financial Resource Strain: Medium Risk (07/13/2022)   Overall Financial Resource Strain (CARDIA)    Difficulty of Paying Living Expenses: Somewhat hard  Food Insecurity: No Food Insecurity (07/13/2022)   Hunger Vital Sign    Worried About Running Out of Food in the Last Year: Never true    Ran Out of Food in the Last Year: Never true  Transportation Needs: No Transportation Needs (07/13/2022)   PRAPARE - Administrator, Civil Service (Medical): No    Lack of Transportation (Non-Medical): No  Physical Activity: Insufficiently Active (07/13/2022)   Exercise Vital Sign    Days of Exercise per Week: 4 days    Minutes of Exercise per Session: 20 min  Stress: No Stress Concern Present (07/13/2022)   [employer] of Occupational Health - Occupational Stress Questionnaire    Feeling of Stress : Not at all  Social Connections: Moderately Isolated (07/13/2022)   Social Connection and Isolation Panel [NHANES]    Frequency of Communication with Friends and Family: More than three times a week    Frequency of Social Gatherings with Friends and Family: Once a week    Attends Religious Services: Never    Database administrator or Organizations: No    Attends Engineer, structural: Never    Marital Status: Married    Tobacco Counseling Counseling given: Not Answered   Clinical Intake:  Pre-visit preparation completed: Yes  Pain : No/denies pain     Nutritional Risks: None Diabetes: No  How often do you need to have someone help you when you read instructions, pamphlets, or other written materials  from your doctor or pharmacy?: 1 - Never  Interpreter Needed?: No  Information entered by :: Joanne Brix LPN   Activities of Daily Living    07/13/2022    1:18 PM  In your present state of health, do you have any difficulty performing the following activities:  Hearing? 0  Vision? 0  Difficulty concentrating or making decisions? 0  Walking or climbing stairs? 0  Dressing or bathing? 0  Doing errands, shopping? 0  Preparing Food and eating ? N  Using the Toilet? N  In the past six months, have you accidently  leaked urine? N  Do you have problems with loss of bowel control? N  Managing your Medications? N  Managing your Finances? N  Housekeeping or managing your Housekeeping? N    Patient Care Team: Eden Emms, NP as PCP - General (Nurse Practitioner) Pleas Koch, MD as Referring Physician (Cardiology) Kathyrn Sheriff, [facility] (Inactive) as Pharmacist (Pharmacist)  Indicate any recent Medical Services you may have received from other than [facility] providers in the past year (date may be approximate).     Assessment:   This is a routine wellness examination for Fabens.  Hearing/Vision screen Hearing Screening - Comments:: No hearing issues Vision Screening - Comments:: Glasses - [facility] - UTD on eye exams  Dietary issues and exercise activities discussed:     Goals Addressed             This Visit's Progress    DIET - EAT MORE FRUITS AND VEGETABLES   On track    Patient Stated       Drink 6-8 glasses of water , exercise more.       Depression Screen    07/13/2022    1:14 PM 03/01/2022   10:56 AM 09/06/2021   10:40 AM 06/02/2021    3:36 PM 02/10/2021   11:36 AM 05/04/2020    9:06 AM 01/16/2020    3:15 PM  PHQ 2/9 Scores  PHQ - 2 Score 0 1 2 1  0 2 0  PHQ- 9 Score  2 6  0 4     Fall Risk    07/13/2022    1:18 PM 04/27/2022    2:03 PM 06/02/2021    3:39 PM 02/10/2021   11:37 AM 01/16/2020    3:15 PM  Fall Risk   Falls in the past year? 0 0 1 1 0   Number falls in past yr: 0 0 1 1 0  Injury with Fall? 0 0 0 0 0  Risk for fall due to : No Fall Risks No Fall Risks History of fall(s)    Follow up Falls prevention discussed;Falls evaluation completed Falls evaluation completed Falls prevention discussed  Falls evaluation completed    MEDICARE RISK AT HOME:  Medicare Risk at Home - 07/13/22 1318     Any stairs in or around the home? Yes    If so, are there any without handrails? No    Home free of loose throw rugs in walkways, pet beds, electrical cords, etc? Yes    Adequate lighting in your home to reduce risk of falls? Yes    Life alert? No    Use of a cane, walker or w/c? No    Grab bars in the bathroom? No    Shower chair or bench in shower? Yes    Elevated toilet seat or a handicapped toilet? No             TIMED UP AND GO:  Was the test performed?  No    Cognitive Function:        07/13/2022    1:20 PM 06/02/2021    3:42 PM  6CIT Screen  What Year? 0 points 0 points  What month? 0 points 0 points  What time? 0 points 0 points  Count back from 20 0 points 0 points  Months in reverse 0 points 0 points  Repeat phrase 4 points 0 points  Total Score 4 points 0 points    Immunizations Immunization History  Administered Date(s) Administered  Fluad Quad(high Dose 65+) 10/22/2018   Influenza-Unspecified 09/28/2020   PFIZER(Purple Top)SARS-COV-2 Vaccination 02/28/2019, 03/23/2019, 10/22/2019, 04/30/2020   Pfizer Covid-19 Vaccine Bivalent Booster 37yrs & up 06/28/2021   Pneumococcal Conjugate-13 04/13/2018   Tdap 06/23/2010   Zoster Recombinat (Shingrix) 06/28/2021    TDAP status: Up to date per pt.  Flu Vaccine status: Declined, Education has been provided regarding the importance of this vaccine but patient still declined. Advised may receive this vaccine at local pharmacy or Health Dept. Aware to provide a copy of the vaccination record if obtained from local pharmacy or Health Dept. Verbalized acceptance  and understanding.  Pneumococcal vaccine status: Up to date per pt  Covid-19 vaccine status: Information provided on how to obtain vaccines.   Qualifies for Shingles Vaccine? Yes   Zostavax completed No   Shingrix Completed?: No.    Education has been provided regarding the importance of this vaccine. Patient has been advised to call insurance company to determine out of pocket expense if they have not yet received this vaccine. Advised may also receive vaccine at local pharmacy or Health Dept. Verbalized acceptance and understanding.  Screening Tests Health Maintenance  Topic Date Due   DEXA SCAN  Never done   Pneumonia Vaccine 55+ Years old (2 of 2 - PPSV23 or PCV20) 04/13/2019   DTaP/Tdap/Td (2 - Td or Tdap) 06/22/2020   Zoster Vaccines- Shingrix (2 of 2) 08/23/2021   COVID-19 Vaccine (6 - 2023-24 season) 09/17/2021   Hepatitis C Screening  12/04/2022 (Originally 05/24/1973)   INFLUENZA VACCINE  08/18/2022   MAMMOGRAM  10/22/2022   Medicare Annual Wellness (AWV)  07/13/2023   Colonoscopy  10/18/2027   HPV VACCINES  Aged Out    Health Maintenance  Health Maintenance Due  Topic Date Due   DEXA SCAN  Never done   Pneumonia Vaccine 35+ Years old (2 of 2 - PPSV23 or PCV20) 04/13/2019   DTaP/Tdap/Td (2 - Td or Tdap) 06/22/2020   Zoster Vaccines- Shingrix (2 of 2) 08/23/2021   COVID-19 Vaccine (6 - 2023-24 season) 09/17/2021    Colorectal cancer screening: Type of screening: Colonoscopy. Completed 10/17/1017. Repeat every 10 years  Mammogram status: Completed 10/21/20. Repeat every year Ordered 09/06/21  Bone Density - Will discuss with PCP at next visit  Lung Cancer Screening: (Low Dose CT Chest recommended if Age 78-80 years, 20 pack-year currently smoking OR have quit w/in 15years.) does not qualify.   Lung Cancer Screening Referral: no  Additional Screening:  Hepatitis C Screening: does qualify;  Due at next OV  Vision Screening: Recommended annual ophthalmology  exams for early detection of glaucoma and other disorders of the eye. Is the patient up to date with their annual eye exam?  Yes  Who is the provider or what is the name of the office in which the patient attends annual eye exams? [facility]. If pt is not established with a provider, would they like to be referred to a provider to establish care? Yes .   Dental Screening: Recommended annual dental exams for proper oral hygiene    Community Resource Referral / Chronic Care Management: CRR required this visit?  No   CCM required this visit?  No     Plan:     I have personally reviewed and noted the following in the patient's chart:   Medical and social history Use of alcohol, tobacco or illicit drugs  Current medications and supplements including opioid prescriptions. Patient is currently taking opioid prescriptions. Information provided to  patient regarding non-opioid alternatives. Patient advised to discuss non-opioid treatment plan with their provider. Functional ability and status Nutritional status Physical activity Advanced directives List of other physicians Hospitalizations, surgeries, and ER visits in previous 12 months Vitals Screenings to include cognitive, depression, and falls Referrals and appointments  In addition, I have reviewed and discussed with patient certain preventive protocols, quality metrics, and best practice recommendations. A written personalized care plan for preventive services as well as general preventive health recommendations were provided to patient.     Maryan Puls, LPN   03/24/6576   After Visit Summary: (MyChart) Due to this being a telephonic visit, the after visit summary with patients personalized plan was offered to patient via MyChart   Nurse Notes: Pt c/o financial difficulties obtaining Eliquis, pt declined community referral. Pt also c/o feeling stressed at times. Pt declined referral. Pt stated she will reach out if  she changes her mind.

## 2022-07-14 ENCOUNTER — Ambulatory Visit (INDEPENDENT_AMBULATORY_CARE_PROVIDER_SITE_OTHER): Payer: Medicare Other | Admitting: Family Medicine

## 2022-07-14 ENCOUNTER — Encounter: Payer: Self-pay | Admitting: Family Medicine

## 2022-07-14 VITALS — BP 110/70 | HR 103 | Temp 98.0°F | Ht 67.0 in | Wt 228.0 lb

## 2022-07-14 DIAGNOSIS — M79671 Pain in right foot: Secondary | ICD-10-CM | POA: Diagnosis not present

## 2022-07-14 DIAGNOSIS — M722 Plantar fascial fibromatosis: Secondary | ICD-10-CM | POA: Diagnosis not present

## 2022-07-14 DIAGNOSIS — M79672 Pain in left foot: Secondary | ICD-10-CM

## 2022-07-14 NOTE — Patient Instructions (Signed)

## 2022-08-17 ENCOUNTER — Encounter (INDEPENDENT_AMBULATORY_CARE_PROVIDER_SITE_OTHER): Payer: Self-pay

## 2022-09-12 ENCOUNTER — Telehealth: Payer: Self-pay | Admitting: Nurse Practitioner

## 2022-09-12 ENCOUNTER — Ambulatory Visit (INDEPENDENT_AMBULATORY_CARE_PROVIDER_SITE_OTHER): Payer: Medicare Other | Admitting: Nurse Practitioner

## 2022-09-12 ENCOUNTER — Encounter: Payer: Self-pay | Admitting: Nurse Practitioner

## 2022-09-12 VITALS — BP 118/64 | HR 65 | Temp 97.3°F | Ht 67.0 in | Wt 226.0 lb

## 2022-09-12 DIAGNOSIS — K279 Peptic ulcer, site unspecified, unspecified as acute or chronic, without hemorrhage or perforation: Secondary | ICD-10-CM | POA: Diagnosis not present

## 2022-09-12 DIAGNOSIS — I480 Paroxysmal atrial fibrillation: Secondary | ICD-10-CM | POA: Diagnosis not present

## 2022-09-12 DIAGNOSIS — G43009 Migraine without aura, not intractable, without status migrainosus: Secondary | ICD-10-CM

## 2022-09-12 DIAGNOSIS — I252 Old myocardial infarction: Secondary | ICD-10-CM

## 2022-09-12 DIAGNOSIS — F320 Major depressive disorder, single episode, mild: Secondary | ICD-10-CM

## 2022-09-12 DIAGNOSIS — E669 Obesity, unspecified: Secondary | ICD-10-CM | POA: Diagnosis not present

## 2022-09-12 DIAGNOSIS — Z780 Asymptomatic menopausal state: Secondary | ICD-10-CM | POA: Diagnosis not present

## 2022-09-12 DIAGNOSIS — Z1231 Encounter for screening mammogram for malignant neoplasm of breast: Secondary | ICD-10-CM | POA: Diagnosis not present

## 2022-09-12 DIAGNOSIS — K118 Other diseases of salivary glands: Secondary | ICD-10-CM | POA: Diagnosis not present

## 2022-09-12 DIAGNOSIS — Z0000 Encounter for general adult medical examination without abnormal findings: Secondary | ICD-10-CM | POA: Diagnosis not present

## 2022-09-12 LAB — CBC
HCT: 41 % (ref 36.0–46.0)
Hemoglobin: 13.5 g/dL (ref 12.0–15.0)
MCHC: 33 g/dL (ref 30.0–36.0)
MCV: 90.8 fl (ref 78.0–100.0)
Platelets: 232 10*3/uL (ref 150.0–400.0)
RBC: 4.51 Mil/uL (ref 3.87–5.11)
RDW: 14.2 % (ref 11.5–15.5)
WBC: 6 10*3/uL (ref 4.0–10.5)

## 2022-09-12 LAB — LIPID PANEL
Cholesterol: 162 mg/dL (ref 0–200)
HDL: 56.6 mg/dL (ref >39.00)
LDL Cholesterol: 72 mg/dL (ref 0–99)
NonHDL: 105.41
Total CHOL/HDL Ratio: 3
Triglycerides: 165 mg/dL — ABNORMAL HIGH (ref 0.0–149.0)
VLDL: 33 mg/dL (ref 0.0–40.0)

## 2022-09-12 LAB — COMPREHENSIVE METABOLIC PANEL
ALT: 31 U/L (ref 0–35)
AST: 19 U/L (ref 0–37)
Albumin: 4 g/dL (ref 3.5–5.2)
Alkaline Phosphatase: 101 U/L (ref 39–117)
BUN: 18 mg/dL (ref 6–23)
CO2: 30 mEq/L (ref 19–32)
Calcium: 9.5 mg/dL (ref 8.4–10.5)
Chloride: 103 mEq/L (ref 96–112)
Creatinine, Ser: 0.83 mg/dL (ref 0.40–1.20)
GFR: 71.99 mL/min (ref >60.00)
Glucose, Bld: 101 mg/dL — ABNORMAL HIGH (ref 70–99)
Potassium: 4.1 mEq/L (ref 3.5–5.1)
Sodium: 141 mEq/L (ref 135–145)
Total Bilirubin: 0.8 mg/dL (ref 0.2–1.2)
Total Protein: 6.6 g/dL (ref 6.0–8.3)

## 2022-09-12 LAB — TSH: TSH: 3.43 u[IU]/mL (ref 0.35–5.50)

## 2022-09-12 LAB — HEMOGLOBIN A1C: Hgb A1c MFr Bld: 6.4 % (ref 4.6–6.5)

## 2022-09-12 MED ORDER — SERTRALINE HCL 25 MG PO TABS
25.0000 mg | ORAL_TABLET | Freq: Every day | ORAL | 0 refills | Status: DC
Start: 2022-09-12 — End: 2022-12-19

## 2022-09-12 MED ORDER — CYCLOBENZAPRINE HCL 5 MG PO TABS
5.0000 mg | ORAL_TABLET | Freq: Two times a day (BID) | ORAL | 0 refills | Status: DC | PRN
Start: 2022-09-12 — End: 2022-11-03

## 2022-09-12 NOTE — Assessment & Plan Note (Signed)
Continue working on healthy lifestyle modifications inclusive of diet and exercise.

## 2022-09-12 NOTE — Assessment & Plan Note (Signed)
Patient is on high intensity statin.  Also followed by cardiology continue medications prescribed follow-up as recommended

## 2022-09-12 NOTE — Assessment & Plan Note (Signed)
Incidental finding on scan has been stable since 2022 no longer followed by ENT per patient report

## 2022-09-12 NOTE — Assessment & Plan Note (Signed)
History of the same has underwent an EGD in 2022.  Continue Protonix.

## 2022-09-12 NOTE — Patient Instructions (Signed)
Nice to see you today I will be in touch with the labs once I have them Follow up with me in 4-6 weeks for a medication recheck

## 2022-09-12 NOTE — Telephone Encounter (Signed)
Left detailed message per dpr advising patient of Matts message. Advised to call the office back with any further questions.

## 2022-09-12 NOTE — Assessment & Plan Note (Signed)
Patient is followed by cardiology at [facility] Dr. Bubba Camp.  Sees her once a year.  Patient is anticoagulated.  Continue taking medication as prescribed follow-up with cardiology as recommended.  Patient does have 2 heart stents

## 2022-09-12 NOTE — Telephone Encounter (Signed)
Called pharmacy verified that script it ready for pick up.   She wanted to check with provider that she is ok to take Zoloft. Listed side effect as stomach bleeding and patient has history of GI bleed. Let patient know we will send to [facility] to review and reach out with his recommendation. Advised not to start until receives call from our office.

## 2022-09-12 NOTE — Assessment & Plan Note (Signed)
Patient is on escitalopram without good relief.  States that she would benefit from therapy but financially unobtainable at this juncture.  Will switch her from escitalopram 5 mg to sertraline 25 mg this will be a one-to-one transfer.

## 2022-09-12 NOTE — Assessment & Plan Note (Signed)
Discussed age-appropriate immunizations and screening exams.  I did review patient's personal, surgical, social, family history.  Patient is up-to-date on all age-appropriate vaccinations that she would like.  She will get the second shingles vaccine at a local pharmacy.  Patient is up-to-date on CRC screening.  Mammogram placed for breast cancer screening.  DEXA scan placed for osteoporosis screening.  Patient was given information at discharge about preventative healthcare maintenance with anticipatory guidance.

## 2022-09-12 NOTE — Progress Notes (Signed)
Established Patient Office Visit  Subjective   Patient ID: Joanne Clark, female    DOB: [birth year]  Age: 67 y.o. MRN: [id number]  Chief Complaint  Patient presents with   Annual Exam    Fasting     HPI  for complete physical and follow up of chronic conditions.  Afib: States that she is seen by Dr. Philipp Ovens. States once a year.   MDD: states that she is doing ok with the depression. States that she does not have a [facility] specialist. Has tried to up it in the past and did not do well with ADE.   Migraines: states that I tis ileft sided on the head. State sthat she thought it was from her neck. States that when she moves her jaw a certain way.  PUD: Protonix, 04/28/2020 last EGD. Followed by Odella Aquas, MD Immunizations: -Tetanus: Completed within 10 years -Influenza: Does not get -Shingles: Completed Shingrix first vaccine -Pneumonia: Completed prevnar 13. Update with prenvar 20 next year  Diet: Fair diet. States that she is doing 2 meals a day sometimes 3. She will drink chit tea and water, carrot juice,  Exercise: No regular exercise. [facility] that [facility] times a [facility] at the [facility]  Eye exam: Completes annually. Wears galsses   Dental exam: Completes semi-annually. Recent had a tooth removed with a "dead spot" that could have developed into a  tumor per patient report     Colonoscopy: Completed in 10/30/2017 repeat in 10 years, due 2029. [facility] Lung Cancer Screening: NA   Pap smear: FHx of cervical cancer. Last one 2022. No abnormal paps per patient report   Mammorgram: Velora Mediate, MD GYN. Needs updating   Dexa: needs updating  Sleep:  states hhse goes t obed 9-10 and get up 7-9. Most fo the time she does not feel rested. Gets 4-6 hours of sleep.       Review of Systems  Constitutional:  Negative for chills and fever.  Respiratory:  Negative for shortness of breath.   Cardiovascular:  Negative for chest pain  and leg swelling.  Gastrointestinal:  Negative for abdominal pain, blood in stool, constipation, diarrhea, nausea and vomiting.       BM daily   Genitourinary:  Negative for dysuria and hematuria.  Neurological:  Positive for headaches. Negative for tingling.  Psychiatric/Behavioral:  Negative for hallucinations and suicidal ideas.       Objective:     BP 118/64   Pulse 65   Temp (!) 97.3 F (36.3 C) (Temporal)   Ht 5\' 7"  (1.702 m)   Wt 226 lb (102.5 kg)   SpO2 97%   BMI 35.40 kg/m  BP Readings from Last 3 Encounters:  09/12/22 118/64  07/14/22 110/70  07/11/22 114/80   Wt Readings from Last 3 Encounters:  09/12/22 226 lb (102.5 kg)  07/14/22 228 lb (103.4 kg)  07/13/22 225 lb (102.1 kg)      Physical Exam Vitals and nursing note reviewed.  Constitutional:      Appearance: Normal appearance.  HENT:     Right Ear: Tympanic membrane, ear canal and external ear normal.     Left Ear: Tympanic membrane, ear canal and external ear normal.     Mouth/Throat:     Mouth: Mucous membranes are moist.     Pharynx: Oropharynx is clear.  Eyes:     Extraocular Movements: Extraocular movements intact.  Pupils: Pupils are equal, round, and reactive to light.  Cardiovascular:     Rate and Rhythm: Normal rate and regular rhythm.     Pulses: Normal pulses.     Heart sounds: Normal heart sounds.  Pulmonary:     Effort: Pulmonary effort is normal.     Breath sounds: Normal breath sounds.  Abdominal:     General: Bowel sounds are normal. There is no distension.     Palpations: There is no mass.     Tenderness: There is no abdominal tenderness.     Hernia: No hernia is present.  Musculoskeletal:     Right lower leg: No edema.     Left lower leg: No edema.  Lymphadenopathy:     Cervical: No cervical adenopathy.  Skin:    General: Skin is warm.  Neurological:     General: No focal deficit present.     Mental Status: She is alert.     Deep Tendon Reflexes:     Reflex  Scores:      Bicep reflexes are 2+ on the right side and 2+ on the left side.      Patellar reflexes are 2+ on the right side and 2+ on the left side.    Comments: Bilateral upper and lower extremity strength 5/5  Psychiatric:        Mood and Affect: Mood normal.        Behavior: Behavior normal.        Thought Content: Thought content normal.        Judgment: Judgment normal.      No results found for any visits on 09/12/22.    The ASCVD Risk score (Arnett DK, et al., 2019) failed to calculate for the following reasons:   The patient has a prior MI or stroke diagnosis    Assessment & Plan:   Problem List Items Addressed This Visit       Cardiovascular and Mediastinum   Atrial fibrillation [facility])    Patient is followed by cardiology at [facility] Dr. Bubba Camp.  Sees her once a year.  Patient is anticoagulated.  Continue taking medication as prescribed follow-up with cardiology as recommended.  Patient does have 2 heart stents      Migraine headache    Patient has recurrent right-sided headaches that is likely so much difficult.  Will try Flexeril 5 mg nightly as needed sedation precautions reviewed      Relevant Medications   sertraline (ZOLOFT) 25 MG tablet   cyclobenzaprine (FLEXERIL) 5 MG tablet     Digestive   PUD (peptic ulcer disease)    History of the same has underwent an EGD in 2022.  Continue Protonix.      Parotid nodule    Incidental finding on scan has been stable since 2022 no longer followed by ENT per patient report        Other   History of ST elevation myocardial infarction (STEMI)    Patient is on high intensity statin.  Also followed by cardiology continue medications prescribed follow-up as recommended      Relevant Orders   Lipid panel   Major depressive disorder    Patient is on escitalopram without good relief.  States that she would benefit from therapy but financially unobtainable at this juncture.  Will switch her from escitalopram 5 mg to  sertraline 25 mg this will be a one-to-one transfer.      Relevant Medications   sertraline (ZOLOFT) 25 MG tablet  Obesity (BMI 30-39.9)    Continue working on healthy lifestyle modifications inclusive of diet and exercise.      Relevant Orders   Hemoglobin A1c   Lipid panel   Preventative health care - Primary    Discussed age-appropriate immunizations and screening exams.  I did review patient's personal, surgical, social, family history.  Patient is up-to-date on all age-appropriate vaccinations that she would like.  She will get the second shingles vaccine at a local pharmacy.  Patient is up-to-date on CRC screening.  Mammogram placed for breast cancer screening.  DEXA scan placed for osteoporosis screening.  Patient was given information at discharge about preventative healthcare maintenance with anticipatory guidance.      Relevant Orders   CBC   Comprehensive metabolic panel   TSH   Other Visit Diagnoses     Screening mammogram for breast cancer       Relevant Orders   MM 3D SCREENING MAMMOGRAM BILATERAL BREAST   Post-menopause       Relevant Orders   DG Bone Density       Return in about 6 weeks (around 10/24/2022) for MDD recheck .    Audria Nine, NP

## 2022-09-12 NOTE — Telephone Encounter (Signed)
Patient contacted the office regarding visit from 8/26, states she and Matt discussed medications sertraline and also discussed flexeril. Patient says she went to pick up meds at pharmacy and they never received the order for flexeril. Patient is asking if this can be sent in for her to pick up at preferred pharmacy

## 2022-09-12 NOTE — Assessment & Plan Note (Signed)
Patient has recurrent right-sided headaches that is likely so much difficult.  Will try Flexeril 5 mg nightly as needed sedation precautions reviewed

## 2022-09-12 NOTE — Telephone Encounter (Signed)
Yes ok to take the sertraline with her history it has an antiplatelet effect technically that is why the warning is there. She is ok to take the medication

## 2022-10-20 ENCOUNTER — Ambulatory Visit
Admission: RE | Admit: 2022-10-20 | Discharge: 2022-10-20 | Disposition: A | Discharge status: Home / Self Care | Payer: Medicare Other | Source: Ambulatory Visit | Attending: Nurse Practitioner | Admitting: Nurse Practitioner

## 2022-10-20 DIAGNOSIS — Z1231 Encounter for screening mammogram for malignant neoplasm of breast: Secondary | ICD-10-CM | POA: Diagnosis not present

## 2022-10-20 DIAGNOSIS — Z780 Asymptomatic menopausal state: Secondary | ICD-10-CM | POA: Diagnosis not present

## 2022-10-21 ENCOUNTER — Other Ambulatory Visit: Payer: Self-pay | Admitting: Nurse Practitioner

## 2022-10-21 DIAGNOSIS — K279 Peptic ulcer, site unspecified, unspecified as acute or chronic, without hemorrhage or perforation: Secondary | ICD-10-CM

## 2022-11-03 ENCOUNTER — Ambulatory Visit (INDEPENDENT_AMBULATORY_CARE_PROVIDER_SITE_OTHER): Payer: Medicare Other | Admitting: Nurse Practitioner

## 2022-11-03 ENCOUNTER — Encounter: Payer: Self-pay | Admitting: Nurse Practitioner

## 2022-11-03 VITALS — BP 110/72 | HR 80 | Temp 97.9°F | Ht 67.0 in | Wt 222.0 lb

## 2022-11-03 DIAGNOSIS — R6884 Jaw pain: Secondary | ICD-10-CM

## 2022-11-03 DIAGNOSIS — G43009 Migraine without aura, not intractable, without status migrainosus: Secondary | ICD-10-CM

## 2022-11-03 DIAGNOSIS — F320 Major depressive disorder, single episode, mild: Secondary | ICD-10-CM

## 2022-11-03 DIAGNOSIS — M7541 Impingement syndrome of right shoulder: Secondary | ICD-10-CM | POA: Diagnosis not present

## 2022-11-03 MED ORDER — CYCLOBENZAPRINE HCL 5 MG PO TABS
5.0000 mg | ORAL_TABLET | Freq: Two times a day (BID) | ORAL | 1 refills | Status: DC | PRN
Start: 2022-11-03 — End: 2023-03-30

## 2022-11-03 NOTE — Assessment & Plan Note (Signed)
Patient currently maintained on sertraline 25 mg daily.  Tolerating medication okay.  She denies HI/SI/AVH.  Patient does not want to increase.  We will continue medication as prescribed.  Continue with therapy

## 2022-11-03 NOTE — Assessment & Plan Note (Addendum)
Patient was given exercises to do at home.  If it does not improve she can follow-up with sports medicine or consider to physical therapy.  Patient can also use over-the-counter Tylenol and Voltaren gel as needed

## 2022-11-03 NOTE — Assessment & Plan Note (Signed)
Some improvement with cyclobenzaprine.  Refill provided.  Follow-up with dentist as recommended

## 2022-11-03 NOTE — Progress Notes (Signed)
Established Patient Office Visit  Subjective   Patient ID: Joanne Clark, female    DOB: April 06, 1955  Age: 67 y.o. MRN: [id number]  Chief Complaint  Patient presents with   Follow-up    Pt states all things good.    HPI  MDD: patient was seen by me on 09/12/2022. States that she was switched to zoloft and at first she was doing terrile. states that her spouse and daughter see a therapist and then described urine turn and patient is signed up to start seeing her.  Patient mentions she is feeling better now but does not want to go up on the dose.  Patient was curious with the medication for constant sensitivity to heat.  States that her sleep has always been bad. States that she will go to bed aroun 11-12 and get up 8-9. States that she is doing ok. States that she has trouble falling asleep  Jaw pain: Patient states she still having the same jaw pain states the Flexeril did help some.  She plans on going back to the dentist she has seen recently.  States that she has joint pain in the shoulder and the knee both on the right. States that her daughter has been painting and she has helped. State that it aches all the time and worse with movement.  Does not radiate. States that some times she is weak in that arm . She is right hand dominant. She cannot take NSAID and sometimes she will do tylenol      09/12/2022    8:39 AM 07/14/2022    8:49 AM 07/13/2022    1:14 PM  PHQ9 SCORE ONLY  PHQ-9 Total Score 2 1 0       09/12/2022    8:40 AM 09/06/2021   10:41 AM 01/16/2020    3:16 PM 06/18/2019    3:45 PM  GAD 7 : Generalized Anxiety Score  Nervous, Anxious, on Edge 0 0 0 0  Control/stop worrying 0 0 0 0  Worry too much - different things 1 0 0 0  Trouble relaxing 0 1 0 0  Restless 0 0 0 0  Easily annoyed or irritable 0 0 0 3  Afraid - awful might happen 0 0 0 0  Total GAD 7 Score 1 1 0 3  Anxiety Difficulty Not difficult at all Not difficult at all Not difficult at all Somewhat  difficult       Review of Systems  Constitutional:  Negative for chills and fever.  Respiratory:  Negative for shortness of breath.   Cardiovascular:  Negative for chest pain.  Neurological:  Negative for headaches.  Psychiatric/Behavioral:  Negative for hallucinations and suicidal ideas.       Objective:     BP 110/72   Pulse 80   Temp 97.9 F (36.6 C) (Oral)   Ht 5\' 7"  (1.702 m)   Wt 222 lb (100.7 kg)   SpO2 97%   BMI 34.77 kg/m    Physical Exam Vitals and nursing note reviewed.  Constitutional:      Appearance: Normal appearance.  Cardiovascular:     Rate and Rhythm: Normal rate and regular rhythm.     Heart sounds: Normal heart sounds.  Pulmonary:     Effort: Pulmonary effort is normal.     Breath sounds: Normal breath sounds.  Musculoskeletal:     Right shoulder: Tenderness present. Normal range of motion. Normal strength. Normal pulse.     Left shoulder: Normal strength. Normal  pulse.     Comments: "+" empty can test and hawkens kennedy test   Neurological:     Mental Status: She is alert.      No results found for any visits on 11/03/22.    The ASCVD Risk score (Arnett DK, et al., 2019) failed to calculate for the following reasons:   The patient has a prior MI or stroke diagnosis    Assessment & Plan:   Problem List Items Addressed This Visit       Cardiovascular and Mediastinum   Migraine headache   Relevant Medications   cyclobenzaprine (FLEXERIL) 5 MG tablet     Musculoskeletal and Integument   Shoulder impingement syndrome, right - Primary    Patient was given exercises to do at home.  If it does not improve she can follow-up with sports medicine or consider to physical therapy.  Patient can also use over-the-counter Tylenol and Voltaren gel as needed      Relevant Medications   cyclobenzaprine (FLEXERIL) 5 MG tablet     Other   Major depressive disorder    Patient currently maintained on sertraline 25 mg daily.  Tolerating  medication okay.  She denies HI/SI/AVH.  Patient does not want to increase.  We will continue medication as prescribed.  Continue with therapy      Jaw pain    Some improvement with cyclobenzaprine.  Refill provided.  Follow-up with dentist as recommended       Return in about 10 months (around 09/13/2023) for CPE and Labs.    Audria Nine, NP

## 2022-11-03 NOTE — Patient Instructions (Signed)
Nice to see you today If you shoulder does not improve schedule with Dr. Marciano Sequin can use over the counter voltaren Gel or lidocaine patches  Follow up with me approx 10 months

## 2022-11-04 ENCOUNTER — Ambulatory Visit: Payer: Medicare Other | Admitting: Nurse Practitioner

## 2022-12-16 ENCOUNTER — Other Ambulatory Visit: Payer: Self-pay | Admitting: Nurse Practitioner

## 2022-12-16 DIAGNOSIS — F320 Major depressive disorder, single episode, mild: Secondary | ICD-10-CM

## 2023-01-19 ENCOUNTER — Other Ambulatory Visit (INDEPENDENT_AMBULATORY_CARE_PROVIDER_SITE_OTHER): Payer: Medicare Other | Admitting: Pharmacist

## 2023-01-19 ENCOUNTER — Telehealth: Payer: Self-pay | Admitting: Nurse Practitioner

## 2023-01-19 DIAGNOSIS — I4891 Unspecified atrial fibrillation: Secondary | ICD-10-CM

## 2023-01-19 NOTE — Progress Notes (Signed)
 Reason for Call: Patient called into clinic requesting to speak with pharmacist regarding medication cost.  Successfully reached patient.   She reports that her Eliquis  cost increased this month to $300.   We discussed Medicare D structure including initial deductible. After deductible is reached, medication copays should go back to usual amount (typically around ~$47/month for preferred brand).  Advised patient to call insurance to see what her medication deductible is for the year.  Briefly discussed Eliquis  PAP through [employer] including the 3% OOP spend requirement for enrollment.  Reviewed elimination of the Medicare Coverage Gap [facility]) for 2025.   Patient verbalizes understanding. She feels that without the donut hole this year, that this will be much more feasible for her. Denies concerns/further questions at this time.   Patient given pharmacist's direct office line for questions/concerns that arise after speaking with insurance company.

## 2023-01-19 NOTE — Telephone Encounter (Signed)
 Copied from CRM [id number]. Topic: General - Other >> Jan 19, 2023 10:04 AM Merlynn LABOR wrote: Reason for CRM: Patient called in to schedule appointment for wife with pharmacist. Please contact patient to schedule appointment. Patient can be reached at [phone].

## 2023-03-17 ENCOUNTER — Ambulatory Visit: Payer: Self-pay | Admitting: Nurse Practitioner

## 2023-03-17 NOTE — Telephone Encounter (Signed)
 If there are not appointments get her setup with virtual care for faster treatment

## 2023-03-17 NOTE — Telephone Encounter (Signed)
  Chief Complaint: sinus congestion Symptoms: bloody snot, cough, chills  Frequency: couple of days   Disposition: [] ED /[x] Urgent Care (no appt availability in office) / [] Appointment(In office/virtual)/ []  [facility]/ [] Home Care/ [] Refused Recommended Disposition /[] [facility]/ []  Follow-up with PCP Additional Notes: Pt calling with complaints of sinus congestion with bloody snot, cough, and chills. Pt stated this started a couple of days ago, but got worse  yesterday. Pt is lying in bed and has chills. Pt denies any difficulty breathing. Per protocol, pt to be seen within 24 hours.  No PCP appts or any [facility] provider appts that are taking new pts. RN advised pt to go to urgent care. Pt is requesting NP Cable call in an antibiotic. RN gave care advice and pt verbalized understanding.             Copied from CRM [id number]. Topic: Clinical - Medical Advice >> Mar 17, 2023  8:58 AM Alona Bene A wrote: Reason for CRM: Patient called in stating that she may have a sinus infection and would like to schedule an appointment. However, there are no appointments available until Monday. Patient does not want to go to an urgent care or ER. Patient is asking for an antibiotic to be sent to the pharmacy for the sinus infection for some relief. Patient is experiencing congestion and off and on nose bleeds due to being congested. Please send medication to pharmacy for patient if possible. Any further questions, patient can be contacted at [phone]. Reason for Disposition  [1] Sinus pain (not just congestion) AND [2] fever  Answer Assessment - Initial Assessment Questions 1. LOCATION: "Where does it hurt?"      Yes; around nose/ears 2. ONSET: "When did the sinus pain start?"  (e.g., hours, days)      Couple of days ago  3. SEVERITY: "How bad is the pain?"   (Scale 1-10; mild, moderate or severe)   - MILD (1-3): doesn't interfere with normal activities    - MODERATE (4-7):  interferes with normal activities (e.g., work or school) or awakens from sleep   - SEVERE (8-10): excruciating pain and patient unable to do any normal activities        5-6 4. RECURRENT SYMPTOM: "Have you ever had sinus problems before?" If Yes, ask: "When was the last time?" and "What happened that time?"      Yes, not sure  5. NASAL CONGESTION: "Is the nose blocked?" If Yes, ask: "Can you open it or must you breathe through your mouth?"     No  6. NASAL DISCHARGE: "Do you have discharge from your nose?" If so ask, "What color?"     Dark/ bloody  7. FEVER: "Do you have a fever?" If Yes, ask: "What is it, how was it measured, and when did it start?"      Not sure  8. OTHER SYMPTOMS: "Do you have any other symptoms?" (e.g., sore throat, cough, earache, difficulty breathing)     Sore throat, mild headache, cough, earache  Protocols used: Sinus Pain or Congestion-A-AH

## 2023-03-18 ENCOUNTER — Encounter: Payer: Self-pay | Admitting: Emergency Medicine

## 2023-03-18 ENCOUNTER — Ambulatory Visit
Admission: EM | Admit: 2023-03-18 | Discharge: 2023-03-18 | Disposition: A | Discharge status: Home / Self Care | Attending: Physician Assistant | Admitting: Physician Assistant

## 2023-03-18 DIAGNOSIS — R051 Acute cough: Secondary | ICD-10-CM | POA: Insufficient documentation

## 2023-03-18 DIAGNOSIS — J069 Acute upper respiratory infection, unspecified: Secondary | ICD-10-CM | POA: Insufficient documentation

## 2023-03-18 DIAGNOSIS — J309 Allergic rhinitis, unspecified: Secondary | ICD-10-CM | POA: Diagnosis present

## 2023-03-18 DIAGNOSIS — R0981 Nasal congestion: Secondary | ICD-10-CM | POA: Insufficient documentation

## 2023-03-18 LAB — RESP PANEL BY RT-PCR (RSV, FLU A&B, COVID)  RVPGX2
Influenza A by PCR: NEGATIVE
Influenza B by PCR: NEGATIVE
Resp Syncytial Virus by PCR: NEGATIVE
SARS Coronavirus 2 by RT PCR: NEGATIVE

## 2023-03-18 MED ORDER — BENZONATATE 200 MG PO CAPS: 200.0000 mg | ORAL_CAPSULE | Freq: Three times a day (TID) | ORAL | 0 refills | Status: DC | PRN

## 2023-03-18 MED ORDER — IPRATROPIUM BROMIDE 0.06 % NA SOLN: 2.0000 | Freq: Four times a day (QID) | NASAL | 0 refills | Status: DC

## 2023-03-18 MED ORDER — PREDNISONE 20 MG PO TABS: 40.0000 mg | ORAL_TABLET | Freq: Every day | ORAL | 0 refills | Status: DC

## 2023-03-18 NOTE — ED Provider Notes (Signed)
 [facility]    CSN: [id number] Arrival date & time: 03/18/23  1044      History   Chief Complaint Chief Complaint  Patient presents with   Sinus Problem    HPI Joanne Clark is a 68 y.o. female presenting for 3 day history of nasal congestion, headaches, sinus pressure, and scratchy throat. Denies fever, fatigue, chest pain, wheezing, SOB. States symptoms began after exposure to cinnamon and she believes she may have an allergy. Has tried a nasal spray but no other meds for symptoms. No sick contacts.   HPI  Past Medical History:  Diagnosis Date   Depression    GERD (gastroesophageal reflux disease)    Headache    Hiatal hernia    Hyperlipemia    Hypertension    Multiple gastric ulcers    Myocardial infarction [facility])    STEMI (ST elevation myocardial infarction) (HCC) 04/10/2018    Patient Active Problem List   Diagnosis Date Noted   Shoulder impingement syndrome, right 11/03/2022   Jaw pain 11/03/2022   Obesity (BMI 30-39.9) 09/12/2022   Preventative health care 09/12/2022   Foot pain, bilateral 07/11/2022   Medication side effect 04/27/2022   Dental infection 03/01/2022   Pain in both lower extremities 12/03/2021   Acute pain of both knees 12/03/2021   Rash 12/03/2021   Abdominal discomfort 12/03/2021   Other dysphagia 12/03/2021   Incidental pulmonary nodule, > 3mm and < 8mm 02/11/2021   Pain of meniscus of right knee 02/10/2021   Right knee injury, initial encounter 02/10/2021   Ankle swelling, right 10/22/2020   Muscle strain of right lower leg 06/17/2020   Parotid nodule 05/04/2020   Major depressive disorder 05/04/2020   Pain of upper abdomen    Stress and adjustment reaction 01/16/2020   Mixed hyperlipidemia 02/14/2019   Atrial fibrillation (HCC) 05/20/2018   Hiatal hernia 05/20/2018   Migraine headache 05/20/2018   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   Presence of stent in coronary artery 05/20/2018    PUD (peptic ulcer disease) 05/20/2018   History of ST elevation myocardial infarction (STEMI) 04/10/2018   History of cholecystectomy 01/28/2010    Past Surgical History:  Procedure Laterality Date   CHOLECYSTECTOMY  2010   COLONOSCOPY W/ ENDOSCOPIC [place]  10/17/2017   ESOPHAGOGASTRODUODENOSCOPY (EGD) WITH PROPOFOL N/A 04/28/2020   Procedure: ESOPHAGOGASTRODUODENOSCOPY (EGD) WITH PROPOFOL;  Surgeon: Toney Reil, MD;  Location: [facility] ENDOSCOPY;  Service: Gastroenterology;  Laterality: N/A;   [place] tooth extraction  2024    OB History   No obstetric history on file.      Home Medications    Prior to Admission medications   Medication Sig Start Date End Date Taking? Authorizing Provider  predniSONE (DELTASONE) 20 MG tablet Take 2 tablets (40 mg total) by mouth daily for 5 days. 03/18/23 03/23/23 Yes Shirlee Latch, PA-C  apixaban (ELIQUIS) 5 MG TABS tablet Take 1 tablet by mouth 2 (two) times daily. cardio 05/07/18   Yisroel Ramming, MD  atorvastatin (LIPITOR) 80 MG tablet Take 1 tablet (80 mg total) by mouth daily. For cholesterol. Will need to be seen in office for more refills. 12/20/21   Eulis Foster, FNP  benzonatate (TESSALON) 200 MG capsule Take 1 capsule (200 mg total) by mouth 3 (three) times daily as needed. 03/18/23  Yes Eusebio Friendly B, PA-C  cyclobenzaprine (FLEXERIL) 5 MG tablet Take 1 tablet (5 mg total) by mouth 2 (two) times daily as needed. 11/03/22  Eden Emms, NP  furosemide (LASIX) 20 MG tablet Take 20 mg by mouth as needed. cardio 04/12/18   [provider]  ipratropium (ATROVENT) 0.06 % nasal spray Place 2 sprays into both nostrils 4 (four) times daily. 03/18/23  Yes Eusebio Friendly B, PA-C  lisinopril (ZESTRIL) 5 MG tablet Take 1 tablet (5 mg total) by mouth daily. cardio 11/09/21   Worthy Rancher B, FNP  metoprolol succinate (TOPROL-XL) 25 MG 24 hr tablet Take 12.5 mg by mouth daily. cardio 05/07/18   [provider]  nitroGLYCERIN  (NITROSTAT) 0.4 MG SL tablet Place under the tongue. cardio 04/12/18   [provider]  oxyCODONE (ROXICODONE) 5 MG immediate release tablet Take 1 tablet (5 mg total) by mouth every 6 (six) hours as needed. 07/03/22 07/03/23  Sherrie Mustache, Roselyn Bering, PA-C  pantoprazole (PROTONIX) 40 MG tablet TAKE 1 TABLET BY MOUTH EVERY DAY 10/24/22   Eden Emms, NP  sertraline (ZOLOFT) 25 MG tablet TAKE 1 TABLET (25 MG TOTAL) BY MOUTH DAILY. 12/19/22   Eden Emms, NP    Family History Family History  Problem Relation Age of Onset   Dementia Mother    Diabetes Mother    Dementia Father    Diabetes Father    Heart disease Father    Cancer Sister        unknown type   Diabetes Sister    Hyperlipidemia Sister    Cervical cancer Sister    Diabetes Brother     Social History Social History   Tobacco Use   Smoking status: Never   Smokeless tobacco: Never  Vaping Use   Vaping status: Never Used  Substance Use Topics   Alcohol use: No   Drug use: No     Allergies   Gluten meal, Methylprednisolone, and Xyzal [levocetirizine]   Review of Systems Review of Systems  Constitutional:  Negative for chills, diaphoresis, fatigue and fever.  HENT:  Positive for congestion, rhinorrhea, sinus pressure and sore throat. Negative for ear pain and sinus pain.   Respiratory:  Positive for cough. Negative for shortness of breath and wheezing.   Cardiovascular:  Negative for chest pain.  Gastrointestinal:  Negative for abdominal pain, nausea and vomiting.  Musculoskeletal:  Negative for arthralgias and myalgias.  Skin:  Negative for rash.  Neurological:  Positive for headaches. Negative for weakness.  Hematological:  Negative for adenopathy.     Physical Exam Triage Vital Signs ED Triage Vitals  Encounter Vitals Group     BP 03/18/23 1121 116/81     Systolic BP Percentile --      Diastolic BP Percentile --      Pulse Rate 03/18/23 1121 83     Resp 03/18/23 1121 14     Temp 03/18/23 1121 99.2  F (37.3 C)     Temp Source 03/18/23 1121 Oral     SpO2 03/18/23 1121 97 %     Weight 03/18/23 1119 222 lb 0.1 oz (100.7 kg)     Height 03/18/23 1119 5\' 7"  (1.702 m)     Head Circumference --      Peak Flow --      Pain Score 03/18/23 1119 4     Pain Loc --      Pain Education --      Exclude from Growth Chart --    No data found.  Updated Vital Signs BP 116/81 (BP Location: Left Arm)   Pulse 83   Temp 99.2 F (37.3  C) (Oral)   Resp 14   Ht 5\' 7"  (1.702 m)   Wt 222 lb 0.1 oz (100.7 kg)   SpO2 97%   BMI 34.77 kg/m     Physical Exam Vitals and nursing note reviewed.  Constitutional:      General: She is not in acute distress.    Appearance: Normal appearance. She is not ill-appearing or toxic-appearing.  HENT:     Head: Normocephalic and atraumatic.     Right Ear: Tympanic membrane, ear canal and external ear normal.     Left Ear: Tympanic membrane, ear canal and external ear normal.     Nose: Congestion present.     Mouth/Throat:     Mouth: Mucous membranes are moist.     Pharynx: Oropharynx is clear.  Eyes:     General: No scleral icterus.       Right eye: No discharge.        Left eye: No discharge.     Conjunctiva/sclera: Conjunctivae normal.  Cardiovascular:     Rate and Rhythm: Normal rate and regular rhythm.     Heart sounds: Normal heart sounds.  Pulmonary:     Effort: Pulmonary effort is normal. No respiratory distress.     Breath sounds: Normal breath sounds.  Musculoskeletal:     Cervical back: Neck supple.  Skin:    General: Skin is dry.  Neurological:     General: No focal deficit present.     Mental Status: She is alert. Mental status is at baseline.     Motor: No weakness.     Gait: Gait normal.  Psychiatric:        Mood and Affect: Mood normal.        Behavior: Behavior normal.      UC Treatments / Results  Labs (all labs ordered are listed, but only abnormal results are displayed) Labs Reviewed  RESP PANEL BY RT-PCR (RSV, FLU A&B,  COVID)  [id number]    EKG   Radiology No results found.  Procedures Procedures (including critical care time)  Medications Ordered in UC Medications - No data to display  Initial Impression / Assessment and Plan / UC Course  I have reviewed the triage vital signs and the nursing notes.  Pertinent labs & imaging results that were available during my care of the patient were reviewed by me and considered in my medical decision making (see chart for details).   68 year old female presents for nasal congestion, sinus pressure, scratchy throat, headaches and cough for 3 days.  Denies fever, chest pain or shortness of breath.  Believes symptoms started around the time she was exposed to cinnamon and thinks that flared up her sinuses.  No sick contacts or known exposure to flu or COVID.  Vital stable and normal.  Patient overall well-appearing.  No acute distress.  On exam no evidence of ear infection.  Mild nasal congestion.  Throat clear.  Chest clear to auscultation heart regular rate and rhythm.  Respiratory panel performed and negative for COVID, flu and RSV.  Reviewed results of patient.  Upper respiratory infection versus allergic sinusitis.  Advised patient to begin using Benadryl, continue nasal saline and also start using Atrovent nasal spray.  Sent benzonatate to pharmacy as needed for cough.  Printed prescription for prednisone in case she is not feeling better she may use this.  Advised to return if fever or acute worsening of symptoms but explained symptoms can last couple weeks. Patient is agreeable to plan.  Final Clinical Impressions(s) / UC Diagnoses   Final diagnoses:  Acute upper respiratory infection  Nasal congestion  Allergic sinusitis  Acute cough     Discharge Instructions      -You are negative for COVID, flu and RSV. -Symptoms are not consistent with bacterial infection.  More consistent with viral upper respiratory infection versus allergic sinusitis  potentially related to the cinnamon exposure. - You may take Benadryl and continue nasal saline.  I sent Atrovent nasal spray to pharmacy.  I also sent benzonatate as needed for cough.  I printed prescription for prednisone in case you not feeling better in few days.  This will help with inflammation especially if its allergy related. - If you are not feeling better after 10 days, develop a fever or acute worsening of symptoms seek reevaluation.     ED Prescriptions     Medication Sig Dispense Auth. Provider   benzonatate (TESSALON) 200 MG capsule Take 1 capsule (200 mg total) by mouth 3 (three) times daily as needed. 30 capsule Eusebio Friendly B, PA-C   ipratropium (ATROVENT) 0.06 % nasal spray Place 2 sprays into both nostrils 4 (four) times daily. 15 mL Eusebio Friendly B, PA-C   predniSONE (DELTASONE) 20 MG tablet Take 2 tablets (40 mg total) by mouth daily for 5 days. 10 tablet Gareth Morgan      PDMP not reviewed this encounter.   Shirlee Latch, PA-C 03/18/23 1232

## 2023-03-18 NOTE — ED Triage Notes (Signed)
 Patient c/o sinus congestion, nasal congestion and headache that started on Thursday.  Patient unsure of fevers.

## 2023-03-18 NOTE — Discharge Instructions (Addendum)
-  You are negative for COVID, flu and RSV. -Symptoms are not consistent with bacterial infection.  More consistent with viral upper respiratory infection versus allergic sinusitis potentially related to the cinnamon exposure. - You may take Benadryl and continue nasal saline.  I sent Atrovent nasal spray to pharmacy.  I also sent benzonatate as needed for cough.  I printed prescription for prednisone in case you not feeling better in few days.  This will help with inflammation especially if its allergy related. - If you are not feeling better after 10 days, develop a fever or acute worsening of symptoms seek reevaluation.

## 2023-03-20 ENCOUNTER — Encounter: Payer: Self-pay | Admitting: Family Medicine

## 2023-03-20 ENCOUNTER — Ambulatory Visit (INDEPENDENT_AMBULATORY_CARE_PROVIDER_SITE_OTHER): Admitting: Family Medicine

## 2023-03-20 VITALS — BP 118/68 | HR 80 | Temp 98.5°F | Ht 67.0 in | Wt 225.2 lb

## 2023-03-20 DIAGNOSIS — J019 Acute sinusitis, unspecified: Secondary | ICD-10-CM | POA: Diagnosis not present

## 2023-03-20 MED ORDER — AMOXICILLIN-POT CLAVULANATE 875-125 MG PO TABS: 1.0000 | ORAL_TABLET | Freq: Two times a day (BID) | ORAL | 0 refills | Status: AC

## 2023-03-20 NOTE — Patient Instructions (Signed)
 You do have a sinus infection, ?viral vs bacterial. Take medicine as prescribed: start guaifenesin or plain mucinex with large glass of water to help mobilize mucous Take augmentin antibiotic 10 day course.  Push fluids and plenty of rest.  Nasal saline irrigation or neti pot to help drain sinuses. Please let us know if fever >101.5, trouble opening/closing mouth, difficulty swallowing, or worsening instead of improving as expected.

## 2023-03-20 NOTE — Progress Notes (Signed)
 Ph: [phone] Fax: [phone]   Patient ID: Joanne Clark, female    DOB: [birth year], 68 y.o.   MRN: [id number]  This visit was conducted in person.  BP 118/68   Pulse 80   Temp 98.5 F (36.9 C) (Oral)   Ht 5\' 7"  (1.702 m)   Wt 225 lb 4 oz (102.2 kg)   SpO2 97%   BMI 35.28 kg/m    CC: "I think I have a sinus infection" Subjective:   HPI: Joanne Clark is a 68 y.o. female presenting on 03/20/2023 for Sinus Problem (Seen on 03/18/23 at [facility], dx acute URI; nasal congestion; allergic sinusitis; acute cough. Given printed prednisone rx, but has not started. States sxs have not improved. Thinks it's a sinus infection.  )   Recently seen at [facility] over weekend with acute URI treated with prednisone but hasn't yet started. Tested negative for COVID RSV, flu.  Treated with atrovent nasal spray, nasal saline, cold compresses, steam.   5-6d h/o head congestion, colored nasal mucous, frontal sinus pressure HA, ear pain, ST, throat feels sore as well as fatigue. Tmax 99.   No fevers/chills, cough, nausea, vomiting, diarrhea, body aches.  No sick contacts at home.   Seeing ENT for evaluation for possible allergies.   She works at [employer].  Went to work today, missed Saturday.   1 yr ago had mass removed from above left tooth - told it was precancerous but fully removed by oral surgeon.   She is on eliquis for atrial fibrillation,      Relevant past medical, surgical, family and social history reviewed and updated as indicated. Interim medical history since our last visit reviewed. Allergies and medications reviewed and updated. Outpatient Medications Prior to Visit  Medication Sig Dispense Refill   apixaban (ELIQUIS) 5 MG TABS tablet Take 1 tablet by mouth 2 (two) times daily. cardio     atorvastatin (LIPITOR) 80 MG tablet Take 1 tablet (80 mg total) by mouth daily. For cholesterol. Will need to be seen in office for more refills. 90 tablet 0   cyclobenzaprine (FLEXERIL)  5 MG tablet Take 1 tablet (5 mg total) by mouth 2 (two) times daily as needed. 30 tablet 1   furosemide (LASIX) 20 MG tablet Take 20 mg by mouth as needed. cardio     ipratropium (ATROVENT) 0.06 % nasal spray Place 2 sprays into both nostrils 4 (four) times daily. 15 mL 0   lisinopril (ZESTRIL) 5 MG tablet Take 1 tablet (5 mg total) by mouth daily. cardio 90 tablet 0   metoprolol succinate (TOPROL-XL) 25 MG 24 hr tablet Take 12.5 mg by mouth daily. cardio     nitroGLYCERIN (NITROSTAT) 0.4 MG SL tablet Place under the tongue. cardio     pantoprazole (PROTONIX) 40 MG tablet TAKE 1 TABLET BY MOUTH EVERY DAY 90 tablet 1   sertraline (ZOLOFT) 25 MG tablet TAKE 1 TABLET (25 MG TOTAL) BY MOUTH DAILY. 90 tablet 1   benzonatate (TESSALON) 200 MG capsule Take 1 capsule (200 mg total) by mouth 3 (three) times daily as needed. 30 capsule 0   oxyCODONE (ROXICODONE) 5 MG immediate release tablet Take 1 tablet (5 mg total) by mouth every 6 (six) hours as needed. 15 tablet 0   predniSONE (DELTASONE) 20 MG tablet Take 2 tablets (40 mg total) by mouth daily for 5 days. (Patient not taking: Reported on 03/20/2023) 10 tablet 0   No facility-administered medications prior to visit.  Per HPI unless specifically indicated in ROS section below Review of Systems  Objective:  BP 118/68   Pulse 80   Temp 98.5 F (36.9 C) (Oral)   Ht 5\' 7"  (1.702 m)   Wt 225 lb 4 oz (102.2 kg)   SpO2 97%   BMI 35.28 kg/m   Wt Readings from Last 3 Encounters:  03/20/23 225 lb 4 oz (102.2 kg)  03/18/23 222 lb 0.1 oz (100.7 kg)  11/03/22 222 lb (100.7 kg)      Physical Exam Vitals and nursing note reviewed.  Constitutional:      Appearance: Normal appearance. She is not ill-appearing.  HENT:     Head: Normocephalic and atraumatic.     Right Ear: Tympanic membrane, ear canal and external ear normal. There is no impacted cerumen.     Left Ear: Tympanic membrane, ear canal and external ear normal. There is no impacted  cerumen.     Nose: Mucosal edema and congestion present. No rhinorrhea.     Right Turbinates: Swollen and pale. Not enlarged.     Left Turbinates: Swollen and pale. Not enlarged.     Right Sinus: Maxillary sinus tenderness and frontal sinus tenderness present.     Left Sinus: Maxillary sinus tenderness and frontal sinus tenderness present.     Mouth/Throat:     Mouth: Mucous membranes are moist.     Pharynx: Oropharynx is clear. No oropharyngeal exudate or posterior oropharyngeal erythema.  Eyes:     Extraocular Movements: Extraocular movements intact.     Conjunctiva/sclera: Conjunctivae normal.     Pupils: Pupils are equal, round, and reactive to light.  Cardiovascular:     Rate and Rhythm: Normal rate and regular rhythm.     Pulses: Normal pulses.     Heart sounds: Normal heart sounds. No murmur heard. Pulmonary:     Effort: Pulmonary effort is normal. No respiratory distress.     Breath sounds: Normal breath sounds. No wheezing, rhonchi or rales.  Musculoskeletal:     Cervical back: Normal range of motion and neck supple.  Lymphadenopathy:     Head:     Right side of head: No submental, submandibular, tonsillar, preauricular or posterior auricular adenopathy.     Left side of head: No submental, submandibular, tonsillar, preauricular or posterior auricular adenopathy.     Cervical: Cervical adenopathy present.     Right cervical: Superficial cervical adenopathy and deep cervical adenopathy present.     Left cervical: Superficial cervical adenopathy and deep cervical adenopathy present.     Upper Body:     Right upper body: No supraclavicular adenopathy.     Left upper body: No supraclavicular adenopathy.  Skin:    Findings: No rash.  Neurological:     Mental Status: She is alert.  Psychiatric:        Mood and Affect: Mood normal.        Behavior: Behavior normal.       Results for orders placed or performed during the hospital encounter of 03/18/23  Resp panel by RT-PCR  (RSV, Flu A&B, Covid) Anterior Nasal Swab   Collection Time: 03/18/23 11:22 AM   Specimen: Anterior Nasal Swab  Result Value Ref Range   SARS Coronavirus 2 by RT PCR NEGATIVE NEGATIVE   Influenza A by PCR NEGATIVE NEGATIVE   Influenza B by PCR NEGATIVE NEGATIVE   Resp Syncytial Virus by PCR NEGATIVE NEGATIVE    Assessment & Plan:   Problem List Items Addressed This Visit  Acute sinusitis - Primary   Anticipate developing sinusitis - concern for developing bacterial infection given progression of symptoms and lack of improvement.  Supportive measures reviewed - fluids, rest, guaifenesin, nasal saline.  Rx augmentin antibiotic with indications when to fill.  Rec against prednisone at this time. Avoid flonase and atrovent nasal sprays given propensity for nose bleeds on eliquis.       Relevant Medications   amoxicillin-clavulanate (AUGMENTIN) 875-125 MG tablet     Meds ordered this encounter  Medications   amoxicillin-clavulanate (AUGMENTIN) 875-125 MG tablet    Sig: Take 1 tablet by mouth 2 (two) times daily for 10 days.    Dispense:  20 tablet    Refill:  0    No orders of the defined types were placed in this encounter.   Patient Instructions  You do have a sinus infection, ?viral vs bacterial. Take medicine as prescribed: start guaifenesin or plain mucinex with large glass of water to help mobilize mucous Take augmentin antibiotic 10 day course.  Push fluids and plenty of rest.  Nasal saline irrigation or neti pot to help drain sinuses. Please let us know if fever >101.5, trouble opening/closing mouth, difficulty swallowing, or worsening instead of improving as expected.   Follow up plan: Return if symptoms worsen or fail to improve.  Eustaquio Boyden, MD

## 2023-03-20 NOTE — Telephone Encounter (Signed)
 Called patient, no answer, lvm for patient to cb and schedule if not feeling any better.

## 2023-03-20 NOTE — Assessment & Plan Note (Addendum)
 Anticipate developing sinusitis - concern for developing bacterial infection given progression of symptoms and lack of improvement.  Supportive measures reviewed - fluids, rest, guaifenesin, nasal saline.  Rx augmentin antibiotic with indications when to fill.  Rec against prednisone at this time. Avoid flonase and atrovent nasal sprays given propensity for nose bleeds on eliquis.

## 2023-03-22 NOTE — Telephone Encounter (Signed)
 LVM for patient to cb and schedule if needed.

## 2023-03-30 ENCOUNTER — Ambulatory Visit (INDEPENDENT_AMBULATORY_CARE_PROVIDER_SITE_OTHER): Admitting: Family Medicine

## 2023-03-30 ENCOUNTER — Encounter: Payer: Self-pay | Admitting: Family Medicine

## 2023-03-30 VITALS — BP 112/76 | HR 88 | Temp 98.5°F | Ht 67.0 in | Wt 225.0 lb

## 2023-03-30 DIAGNOSIS — G43009 Migraine without aura, not intractable, without status migrainosus: Secondary | ICD-10-CM | POA: Diagnosis not present

## 2023-03-30 DIAGNOSIS — J019 Acute sinusitis, unspecified: Secondary | ICD-10-CM

## 2023-03-30 DIAGNOSIS — M79604 Pain in right leg: Secondary | ICD-10-CM

## 2023-03-30 DIAGNOSIS — M79605 Pain in left leg: Secondary | ICD-10-CM | POA: Diagnosis not present

## 2023-03-30 MED ORDER — CYCLOBENZAPRINE HCL 5 MG PO TABS: 5.0000 mg | ORAL_TABLET | Freq: Two times a day (BID) | ORAL | 0 refills | Status: DC | PRN

## 2023-03-30 NOTE — Assessment & Plan Note (Signed)
 Occational takes flexeril for this and leg pain  Refilled with caution of sedation in pcp absence

## 2023-03-30 NOTE — Assessment & Plan Note (Signed)
 Much improvement after treatment with augmentin  Reassuring exam  Now dealing with dental issues  Some ? Of rheum referral / will reach out to her pcp

## 2023-03-30 NOTE — Assessment & Plan Note (Signed)
 Refilled flexeril with caution of sedation in pcp absence

## 2023-03-30 NOTE — Patient Instructions (Signed)
 Glad the sinus infection is better   I will reach out to [facility] when he returns about referrals and next steps  Continue dental care   Blood pressure and pulse are normal today   I refilled flexeril

## 2023-03-30 NOTE — Progress Notes (Signed)
 Subjective:    Patient ID: Joanne Clark, female    DOB: [birth year], 68 y.o.   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  Supportive measures reviewed - fluids, rest, guaifenesin, nasal saline.  Rx augmentin antibiotic with indications when to fill.  Rec against prednisone at this time. Avoid flonase and atrovent nasal sprays given propensity for nose bleeds on eliquis.         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 Never happened  (swelling after dental work)   ? Rheum referral    Improved Mucous is less No headache  No facial pain   Swelling under mandible is improved  A little worse on right than left    Dental wise - waiting to have a bone graft on right side  Does not think she has infection right now   Lab Results  Component Value Date   WBC 6.0 09/12/2022   HGB 13.5 09/12/2022   HCT 41.0 09/12/2022   MCV 90.8 09/12/2022   PLT 232.0 09/12/2022       Patient Active Problem List   Diagnosis Date Noted   Acute sinusitis 03/20/2023   Shoulder impingement syndrome, right 11/03/2022    Jaw pain 11/03/2022   Obesity (BMI 30-39.9) 09/12/2022   Preventative health care 09/12/2022   Foot pain, bilateral 07/11/2022   Medication side effect 04/27/2022   Dental infection 03/01/2022   Pain in both lower extremities 12/03/2021   Acute pain of both knees 12/03/2021   Rash 12/03/2021   Abdominal discomfort 12/03/2021   Other dysphagia 12/03/2021   Incidental pulmonary nodule, > 3mm and < 8mm 02/11/2021   Pain of meniscus of right knee 02/10/2021   Right knee injury, initial encounter 02/10/2021   Ankle swelling, right 10/22/2020   Muscle strain of right lower leg 06/17/2020   Parotid nodule 05/04/2020   Major depressive disorder 05/04/2020   Pain of upper abdomen    Stress and adjustment reaction 01/16/2020   Mixed hyperlipidemia 02/14/2019   Atrial fibrillation (HCC) 05/20/2018   Hiatal hernia 05/20/2018   Migraine headache 05/20/2018   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   Presence of stent in coronary artery 05/20/2018   PUD (peptic ulcer disease) 05/20/2018   History of ST elevation myocardial infarction (STEMI) 04/10/2018   History of cholecystectomy 01/28/2010   Past Medical History:  Diagnosis Date   Depression    GERD (gastroesophageal reflux disease)    Headache  Hiatal hernia    Hyperlipemia    Hypertension    Multiple gastric ulcers    Myocardial infarction [facility])    STEMI (ST elevation myocardial infarction) (HCC) 04/10/2018   Past Surgical History:  Procedure Laterality Date   CHOLECYSTECTOMY  2010   COLONOSCOPY W/ ENDOSCOPIC [place]  10/17/2017   ESOPHAGOGASTRODUODENOSCOPY (EGD) WITH PROPOFOL N/A 04/28/2020   Procedure: ESOPHAGOGASTRODUODENOSCOPY (EGD) WITH PROPOFOL;  Surgeon: Toney Reil, MD;  Location: [facility] ENDOSCOPY;  Service: Gastroenterology;  Laterality: N/A;   [place] tooth extraction  2024   Social History   Tobacco Use   Smoking status: Never   Smokeless tobacco: Never  Vaping Use   Vaping  status: Never Used  Substance Use Topics   Alcohol use: No   Drug use: No   Family History  Problem Relation Age of Onset   Dementia Mother    Diabetes Mother    Dementia Father    Diabetes Father    Heart disease Father    Cancer Sister        unknown type   Diabetes Sister    Hyperlipidemia Sister    Cervical cancer Sister    Diabetes Brother    Allergies  Allergen Reactions   Gluten Meal     Other reaction(s): GI Reaction   Methylprednisolone Other (See Comments)    Injectable form: caused body jerks/spasms - in [place] [place] [facility]   Xyzal [Levocetirizine] Other (See Comments)    Causes nose bleeds   Current Outpatient Medications on File Prior to Visit  Medication Sig Dispense Refill   amoxicillin-clavulanate (AUGMENTIN) 875-125 MG tablet Take 1 tablet by mouth 2 (two) times daily for 10 days. 20 tablet 0   apixaban (ELIQUIS) 5 MG TABS tablet Take 1 tablet by mouth 2 (two) times daily. cardio     atorvastatin (LIPITOR) 80 MG tablet Take 1 tablet (80 mg total) by mouth daily. For cholesterol. Will need to be seen in office for more refills. 90 tablet 0   furosemide (LASIX) 20 MG tablet Take 20 mg by mouth as needed. cardio     lisinopril (ZESTRIL) 5 MG tablet Take 1 tablet (5 mg total) by mouth daily. cardio 90 tablet 0   metoprolol succinate (TOPROL-XL) 25 MG 24 hr tablet Take 12.5 mg by mouth daily. cardio     nitroGLYCERIN (NITROSTAT) 0.4 MG SL tablet Place under the tongue. cardio     pantoprazole (PROTONIX) 40 MG tablet TAKE 1 TABLET BY MOUTH EVERY DAY 90 tablet 1   sertraline (ZOLOFT) 25 MG tablet TAKE 1 TABLET (25 MG TOTAL) BY MOUTH DAILY. 90 tablet 1   No current facility-administered medications on file prior to visit.    Review of Systems  Constitutional:  Negative for activity change, appetite change, fatigue, fever and unexpected weight change.  HENT:  Positive for postnasal drip. Negative for congestion, ear pain, rhinorrhea, sinus pressure, sinus pain and sore  throat.        Chronic full feeling under mandible Chronic dental issues   Eyes:  Negative for pain, redness and visual disturbance.  Respiratory:  Negative for cough, shortness of breath and wheezing.   Cardiovascular:  Negative for chest pain and palpitations.  Gastrointestinal:  Negative for abdominal pain, blood in stool, constipation and diarrhea.  Endocrine: Negative for polydipsia and polyuria.  Genitourinary:  Negative for dysuria, frequency and urgency.  Musculoskeletal:  Positive for arthralgias and myalgias. Negative for back pain.  Skin:  Negative for pallor and rash.  Allergic/Immunologic:  Negative for environmental allergies.  Neurological:  Positive for headaches. Negative for dizziness and syncope.  Hematological:  Negative for adenopathy. Does not bruise/bleed easily.  Psychiatric/Behavioral:  Negative for decreased concentration and dysphoric mood. The patient is not nervous/anxious.        Objective:   Physical Exam Constitutional:      General: She is not in acute distress.    Appearance: Normal appearance. She is well-developed. She is obese. She is not ill-appearing or diaphoretic.  HENT:     Head: Normocephalic and atraumatic.     Comments: No facial tenderness    Right Ear: Tympanic membrane and ear canal normal.     Left Ear: Tympanic membrane normal.     Nose:     Comments: Boggy full nares     Mouth/Throat:     Mouth: Mucous membranes are moist.     Pharynx: Oropharynx is clear.  Eyes:     Conjunctiva/sclera: Conjunctivae normal.     Pupils: Pupils are equal, round, and reactive to light.  Neck:     Thyroid: No thyromegaly.     Vascular: No carotid bruit or JVD.     Comments: No adenopathy  No neck swelling   Pt says that soft tissue under mandible is always sensitive  Cardiovascular:     Rate and Rhythm: Normal rate and regular rhythm.     Heart sounds: Normal heart sounds.     No gallop.  Pulmonary:     Effort: Pulmonary effort is normal.  No respiratory distress.     Breath sounds: Normal breath sounds. No wheezing or rales.  Abdominal:     General: There is no distension or abdominal bruit.     Palpations: Abdomen is soft.  Musculoskeletal:     Cervical back: Normal range of motion and neck supple. Tenderness present. No rigidity.     Right lower leg: No edema.     Left lower leg: No edema.  Lymphadenopathy:     Cervical: No cervical adenopathy.  Skin:    General: Skin is warm and dry.     Coloration: Skin is not pale.     Findings: No rash.  Neurological:     Mental Status: She is alert.     Cranial Nerves: No cranial nerve deficit.     Coordination: Coordination normal.     Deep Tendon Reflexes: Reflexes are normal and symmetric. Reflexes normal.  Psychiatric:        Mood and Affect: Mood normal.           Assessment & Plan:   Problem List Items Addressed This Visit       Cardiovascular and Mediastinum   Migraine headache   Occational takes flexeril for this and leg pain  Refilled with caution of sedation in pcp absence       Relevant Medications   cyclobenzaprine (FLEXERIL) 5 MG tablet     Respiratory   Acute sinusitis - Primary   Much improvement after treatment with augmentin  Reassuring exam  Now dealing with dental issues  Some ? Of rheum referral / will reach out to her pcp           Other   Pain in both lower extremities   Refilled flexeril with caution of sedation in pcp absence

## 2023-04-07 ENCOUNTER — Telehealth: Payer: Self-pay | Admitting: Nurse Practitioner

## 2023-04-07 NOTE — Telephone Encounter (Signed)
 Can we get patient in for a follow up if needed

## 2023-04-07 NOTE — Telephone Encounter (Signed)
-----   Message from [facility] sent at 03/30/2023  1:58 PM EDT ----- Patient was tangential and hard to follow  Had questions about a rheumatology referral and something about jaw ? Her sinus infection seems better I told her I would reach out to you  I assume she needs follow up

## 2023-04-10 NOTE — Telephone Encounter (Signed)
 Left voicemail for patient to call the office back.

## 2023-04-12 NOTE — Telephone Encounter (Signed)
 Contacted pt. Pt declines follow up appointment at this time.

## 2023-04-21 ENCOUNTER — Other Ambulatory Visit: Payer: Self-pay | Admitting: Nurse Practitioner

## 2023-04-21 DIAGNOSIS — K279 Peptic ulcer, site unspecified, unspecified as acute or chronic, without hemorrhage or perforation: Secondary | ICD-10-CM

## 2023-04-26 ENCOUNTER — Other Ambulatory Visit: Payer: Self-pay | Admitting: Family Medicine

## 2023-04-26 DIAGNOSIS — G43009 Migraine without aura, not intractable, without status migrainosus: Secondary | ICD-10-CM

## 2023-04-27 NOTE — Telephone Encounter (Signed)
 Last filled 03-30-23 #30 Last OV 04-21-23 Next OV 09-04-23 [facility]

## 2023-06-12 ENCOUNTER — Other Ambulatory Visit: Payer: Self-pay | Admitting: Nurse Practitioner

## 2023-06-12 DIAGNOSIS — F320 Major depressive disorder, single episode, mild: Secondary | ICD-10-CM

## 2023-07-17 ENCOUNTER — Ambulatory Visit: Payer: Medicare Other

## 2023-07-17 VITALS — BP 112/76 | Ht 67.0 in | Wt 225.0 lb

## 2023-07-17 DIAGNOSIS — Z0000 Encounter for general adult medical examination without abnormal findings: Secondary | ICD-10-CM | POA: Diagnosis not present

## 2023-07-17 NOTE — Patient Instructions (Signed)
 [name] , Thank you for taking time out of your busy schedule to complete your Annual Wellness Visit with me. I enjoyed our conversation and look forward to speaking with you again next year. I, as well as your care team,  appreciate your ongoing commitment to your health goals. Please review the following plan we discussed and let me know if I can assist you in the future. Your Game plan/ To Do List    Referrals: If you haven't heard from the office you've been referred to, please reach out to them at the phone provided.  none Follow up Visits: Next Medicare AWV with our clinical staff: 07/17/2024   Have you seen your provider in the last 6 months (3 months if uncontrolled diabetes)? Yes Next Office Visit with your provider: 09/04/2023  Clinician Recommendations:  Aim for 30 minutes of exercise or brisk walking, 6-8 glasses of water, and 5 servings of fruits and vegetables each day.       This is a list of the screening recommended for you and due dates:  Health Maintenance  Topic Date Due   Hepatitis C Screening  Never done   Pneumococcal Vaccine for age over 45 (2 of 2 - PPSV23, PCV20, or PCV21) 06/08/2018   DTaP/Tdap/Td vaccine (2 - Td or Tdap) 06/22/2020   Zoster (Shingles) Vaccine (2 of 2) 08/23/2021   COVID-19 Vaccine (6 - 2024-25 season) 09/03/2023*   Flu Shot  08/18/2023   Medicare Annual Wellness Visit  07/16/2024   Mammogram  10/19/2024   Colon Cancer Screening  10/18/2027   DEXA scan (bone density measurement)  Completed   Hepatitis B Vaccine  Aged Out   HPV Vaccine  Aged Out   Meningitis B Vaccine  Aged Out  *Topic was postponed. The date shown is not the original due date.    Advanced directives: (Copy Requested) Please bring a copy of your health care power of attorney and living will to the office to be added to your chart at your convenience. You can mail to [facility] [address] [place], [place] [address] or email to  [email or web address] Advance Care Planning is important because it:  [x]  Makes sure you receive the medical care that is consistent with your values, goals, and preferences  [x]  It provides guidance to your family and loved ones and reduces their decisional burden about whether or not they are making the right decisions based on your wishes.  Follow the link provided in your after visit summary or read over the paperwork we have mailed to you to help you started getting your Advance Directives in place. If you need assistance in completing these, please reach out to us  so that we can help you!  See attachments for Preventive Care and Fall Prevention Tips.

## 2023-07-17 NOTE — Progress Notes (Signed)
 Because this visit was a virtual/telehealth visit,  certain criteria was not obtained, such a blood pressure, CBG if applicable, and timed get up and go. Any medications not marked as taking were not mentioned during the medication reconciliation part of the visit. Any vitals not documented were not able to be obtained due to this being a telehealth visit or patient was unable to self-report a recent blood pressure reading due to a lack of equipment at home via telehealth. Vitals that have been documented are verbally provided by the patient.   This visit was performed by a medical professional under my direct supervision. I was immediately available for consultation/collaboration. I have reviewed and agree with the Annual Wellness Visit documentation.  Subjective:   Joanne Clark is a 68 y.o. who presents for a Medicare Wellness preventive visit.  As a reminder, Annual Wellness Visits don't include a physical exam, and some assessments may be limited, especially if this visit is performed virtually. We may recommend an in-person follow-up visit with your provider if needed.  Visit Complete: Virtual I connected with  Joanne Clark on 07/17/23 by a audio enabled telemedicine application and verified that I am speaking with the correct person using two identifiers.  Patient Location: Home  Provider Location: Home Office  I discussed the limitations of evaluation and management by telemedicine. The patient expressed understanding and agreed to proceed.  Vital Signs: Because this visit was a virtual/telehealth visit, some criteria may be missing or patient reported. Any vitals not documented were not able to be obtained and vitals that have been documented are patient reported.  VideoDeclined- This patient declined Librarian, academic. Therefore the visit was completed with audio only.  Persons Participating in Visit: Patient.  AWV Questionnaire: No: Patient  Medicare AWV questionnaire was not completed prior to this visit.  Cardiac Risk Factors include: advanced age (>67men, >10 women);obesity (BMI >30kg/m2);Other (see comment);dyslipidemia;hypertension, Risk factor comments: afib     Objective:    Today's Vitals   07/17/23 1320  BP: 112/76  Weight: 225 lb (102.1 kg)  Height: 5' 7 (1.702 m)   Body mass index is 35.24 kg/m.     07/17/2023    1:19 PM 03/18/2023   11:21 AM 07/13/2022    1:16 PM 07/03/2022   10:41 AM 06/02/2021    3:38 PM 01/22/2021    5:24 PM 04/28/2020    7:47 AM  Advanced Directives  Does Patient Have a Medical Advance Directive? No No No No No No No  Would patient like information on creating a medical advance directive? No - Patient declined  No - Patient declined  No - Patient declined      Current Medications (verified) Outpatient Encounter Medications as of 07/17/2023  Medication Sig   apixaban  (ELIQUIS ) 5 MG TABS tablet Take 1 tablet by mouth 2 (two) times daily. cardio   atorvastatin  (LIPITOR) 80 MG tablet Take 1 tablet (80 mg total) by mouth daily. For cholesterol. Will need to be seen in office for more refills.   cyclobenzaprine  (FLEXERIL ) 5 MG tablet TAKE 1 TABLET BY MOUTH 2 TIMES DAILY AS NEEDED.   furosemide (LASIX) 20 MG tablet Take 20 mg by mouth as needed. cardio   lisinopril  (ZESTRIL ) 5 MG tablet Take 1 tablet (5 mg total) by mouth daily. cardio   metoprolol  succinate (TOPROL -XL) 25 MG 24 hr tablet Take 12.5 mg by mouth daily. cardio   nitroGLYCERIN (NITROSTAT) 0.4 MG SL tablet Place under the tongue. cardio  pantoprazole  (PROTONIX ) 40 MG tablet TAKE 1 TABLET BY MOUTH EVERY DAY   sertraline  (ZOLOFT ) 25 MG tablet TAKE 1 TABLET (25 MG TOTAL) BY MOUTH DAILY.   No facility-administered encounter medications on file as of 07/17/2023.    Allergies (verified) Gluten meal, Methylprednisolone , and Xyzal  [levocetirizine]   History: Past Medical History:  Diagnosis Date   Depression    GERD  (gastroesophageal reflux disease)    Headache    Hiatal hernia    Hyperlipemia    Hypertension    Multiple gastric ulcers    Myocardial infarction [facility])    STEMI (ST elevation myocardial infarction) (HCC) 04/10/2018   Past Surgical History:  Procedure Laterality Date   CHOLECYSTECTOMY  2010   COLONOSCOPY W/ ENDOSCOPIC US   10/17/2017   ESOPHAGOGASTRODUODENOSCOPY (EGD) WITH PROPOFOL  N/A 04/28/2020   Procedure: ESOPHAGOGASTRODUODENOSCOPY (EGD) WITH PROPOFOL ;  Surgeon: Unk Corinn Skiff, MD;  Location: [facility] ENDOSCOPY;  Service: Gastroenterology;  Laterality: N/A;   wisdon tooth extraction  2024   Family History  Problem Relation Age of Onset   Dementia Mother    Diabetes Mother    Dementia Father    Diabetes Father    Heart disease Father    Cancer Sister        unknown type   Diabetes Sister    Hyperlipidemia Sister    Cervical cancer Sister    Diabetes Brother    Social History   Socioeconomic History   Marital status: Married    Spouse name: Lynwood   Number of children: 1   Years of education: some college   Highest education level: Not on file  Occupational History   Not on file  Tobacco Use   Smoking status: Never   Smokeless tobacco: Never  Vaping Use   Vaping status: Never Used  Substance and Sexual Activity   Alcohol use: No   Drug use: No   Sexual activity: Not Currently  Other Topics Concern   Not on file  Social History Narrative   01/16/20   From: [place] originally, moved 2021 to be near daughter   Living: with husband, Lynwood [phone])   Work: retired Occupational hygienist      Family: daughter Josette      Enjoys: reading, games, walking the park       Exercise: walking 2-3 times a week   Diet: tires to do heart healthy diet      Safety   Seat belts: Yes    Guns: Yes  and secure   Safe in relationships: Yes    Social Drivers of Corporate investment banker Strain: Medium Risk (07/17/2023)   Overall Financial Resource Strain (CARDIA)     Difficulty of Paying Living Expenses: Somewhat hard  Food Insecurity: No Food Insecurity (07/17/2023)   Hunger Vital Sign    Worried About Running Out of Food in the Last Year: Never true    Ran Out of Food in the Last Year: Never true  Transportation Needs: No Transportation Needs (07/17/2023)   PRAPARE - Administrator, Civil Service (Medical): No    Lack of Transportation (Non-Medical): No  Physical Activity: Insufficiently Active (07/17/2023)   Exercise Vital Sign    Days of Exercise per Week: 3 days    Minutes of Exercise per Session: 20 min  Stress: No Stress Concern Present (07/17/2023)   [employer] of Occupational Health - Occupational Stress Questionnaire    Feeling of Stress: Not at all  Social Connections: Moderately Isolated (  07/17/2023)   Social Connection and Isolation Panel    Frequency of Communication with Friends and Family: More than three times a week    Frequency of Social Gatherings with Friends and Family: Once a week    Attends Religious Services: Never    Database administrator or Organizations: No    Attends Engineer, structural: Never    Marital Status: Married    Tobacco Counseling Counseling given: Not Answered    Clinical Intake:  Pre-visit preparation completed: Yes  Pain : No/denies pain     BMI - recorded: 35.24 Nutritional Status: BMI > 30  Obese Nutritional Risks: Other (Comment) Diabetes: No  Lab Results  Component Value Date   HGBA1C 6.4 09/12/2022     How often do you need to have someone help you when you read instructions, pamphlets, or other written materials from your doctor or pharmacy?: 1 - Never  Interpreter Needed?: No  Information entered by :: Jeancarlo Leffler,cma   Activities of Daily Living     07/17/2023    1:23 PM  In your present state of health, do you have any difficulty performing the following activities:  Hearing? 0  Vision? 0  Difficulty concentrating or making decisions? 0   Walking or climbing stairs? 0  Dressing or bathing? 0  Doing errands, shopping? 0  Preparing Food and eating ? N  Using the Toilet? N  In the past six months, have you accidently leaked urine? N  Do you have problems with loss of bowel control? N  Managing your Medications? N  Managing your Finances? N  Housekeeping or managing your Housekeeping? N    Patient Care Team: Wendee [place] HERO, NP as PCP - General (Nurse Practitioner) Myra Duncan, MD as Referring Physician (Cardiology) Fate Morna SAILOR, [facility] (Inactive) as Pharmacist (Pharmacist)  I have updated your Care Teams any recent Medical Services you may have received from other providers in the past year.     Assessment:   This is a routine wellness examination for [place].  Hearing/Vision screen Hearing Screening - Comments:: No difficulties Vision Screening - Comments:: Wears glasses   Goals Addressed             This Visit's Progress    DIET - EAT MORE FRUITS AND VEGETABLES   On track      Depression Screen     07/17/2023    1:26 PM 09/12/2022    8:39 AM 07/14/2022    8:49 AM 07/13/2022    1:14 PM 03/01/2022   10:56 AM 09/06/2021   10:40 AM 06/02/2021    3:36 PM  PHQ 2/9 Scores  PHQ - 2 Score 0 2 1 0 1 2 1   PHQ- 9 Score 3 2 1  2 6      Fall Risk     07/17/2023    1:23 PM 09/12/2022    8:39 AM 07/13/2022    1:18 PM 04/27/2022    2:03 PM 06/02/2021    3:39 PM  Fall Risk   Falls in the past year? 0 0 0 0 1  Number falls in past yr: 0 0 0 0 1  Injury with Fall? 0 0 0 0 0  Risk for fall due to : No Fall Risks No Fall Risks No Fall Risks No Fall Risks History of fall(s)  Follow up Falls evaluation completed Falls evaluation completed Falls prevention discussed;Falls evaluation completed Falls evaluation completed Falls prevention discussed      Data saved  with a previous flowsheet row definition    MEDICARE RISK AT HOME:  Medicare Risk at Home Any stairs in or around the home?: Yes If so, are there any  without handrails?: No Home free of loose throw rugs in walkways, pet beds, electrical cords, etc?: Yes Adequate lighting in your home to reduce risk of falls?: Yes Life alert?: No Use of a cane, walker or w/c?: No Grab bars in the bathroom?: No Shower chair or bench in shower?: Yes Elevated toilet seat or a handicapped toilet?: No  TIMED UP AND GO:  Was the test performed?  No  Cognitive Function: 6CIT completed        07/17/2023    1:22 PM 07/13/2022    1:20 PM 06/02/2021    3:42 PM  6CIT Screen  What Year? 0 points 0 points 0 points  What month? 0 points 0 points 0 points  What time? 0 points 0 points 0 points  Count back from 20 0 points 0 points 0 points  Months in reverse 0 points 0 points 0 points  Repeat phrase 0 points 4 points 0 points  Total Score 0 points 4 points 0 points    Immunizations Immunization History  Administered Date(s) Administered   Fluad Quad(high Dose 65+) 10/22/2018   Influenza-Unspecified 09/28/2020   PFIZER(Purple Top)SARS-COV-2 Vaccination 02/28/2019, 03/23/2019, 10/22/2019, 04/30/2020   Pfizer Covid-19 Vaccine Bivalent Booster 51yrs & up 06/28/2021   Pneumococcal Conjugate-13 04/13/2018   RSV,unspecified 06/03/2023   Tdap 06/23/2010   Zoster Recombinant(Shingrix) 06/28/2021    Screening Tests Health Maintenance  Topic Date Due   Hepatitis C Screening  Never done   Pneumococcal Vaccine: 50+ Years (2 of 2 - PPSV23, PCV20, or PCV21) 06/08/2018   DTaP/Tdap/Td (2 - Td or Tdap) 06/22/2020   Zoster Vaccines- Shingrix (2 of 2) 08/23/2021   COVID-19 Vaccine (6 - 2024-25 season) 09/03/2023 (Originally 09/18/2022)   INFLUENZA VACCINE  08/18/2023   Medicare Annual Wellness (AWV)  07/16/2024   MAMMOGRAM  10/19/2024   Colonoscopy  10/18/2027   DEXA SCAN  Completed   Hepatitis B Vaccines  Aged Out   HPV VACCINES  Aged Out   Meningococcal B Vaccine  Aged Out    Health Maintenance  Health Maintenance Due  Topic Date Due   Hepatitis C  Screening  Never done   Pneumococcal Vaccine: 50+ Years (2 of 2 - PPSV23, PCV20, or PCV21) 06/08/2018   DTaP/Tdap/Td (2 - Td or Tdap) 06/22/2020   Zoster Vaccines- Shingrix (2 of 2) 08/23/2021   Health Maintenance Items Addressed:   Additional Screening:  Vision Screening: Recommended annual ophthalmology exams for early detection of glaucoma and other disorders of the eye. Would you like a referral to an eye doctor? No    Dental Screening: Recommended annual dental exams for proper oral hygiene  Community Resource Referral / Chronic Care Management: CRR required this visit?  No   CCM required this visit?  No   Plan:    I have personally reviewed and noted the following in the patient's chart:   Medical and social history Use of alcohol, tobacco or illicit drugs  Current medications and supplements including opioid prescriptions. Patient is not currently taking opioid prescriptions. Functional ability and status Nutritional status Physical activity Advanced directives List of other physicians Hospitalizations, surgeries, and ER visits in previous 12 months Vitals Screenings to include cognitive, depression, and falls Referrals and appointments  In addition, I have reviewed and discussed with patient certain preventive protocols, quality  metrics, and best practice recommendations. A written personalized care plan for preventive services as well as general preventive health recommendations were provided to patient.   Lyle MARLA Right, [place]   07/17/2023   After Visit Summary: (MyChart) Due to this being a telephonic visit, the after visit summary with patients personalized plan was offered to patient via MyChart   Notes: Nothing significant to report at this time.

## 2023-08-28 ENCOUNTER — Encounter: Payer: Self-pay | Admitting: Internal Medicine

## 2023-08-28 ENCOUNTER — Ambulatory Visit (INDEPENDENT_AMBULATORY_CARE_PROVIDER_SITE_OTHER): Admitting: Internal Medicine

## 2023-08-28 VITALS — BP 108/80 | HR 70 | Temp 98.1°F | Ht 67.0 in | Wt 234.0 lb

## 2023-08-28 DIAGNOSIS — H9203 Otalgia, bilateral: Secondary | ICD-10-CM | POA: Diagnosis not present

## 2023-08-28 NOTE — Progress Notes (Signed)
 Subjective:    Patient ID: Joanne Clark, female    DOB: 1955-06-15, 68 y.o.   MRN: [id number]  HPI Here due to ear problems  Ears feel plugged---a little pain Hearing is affected Noticed it for about a week Thought it was from sinuses---but now worse Even had some dizziness from it Has noise in left ear---less so on right (like water)  No sinus trouble now----no drainage, etc Has some headaches--?from neck issue  Tried saline nasal spray  Current Outpatient Medications on File Prior to Visit  Medication Sig Dispense Refill   apixaban  (ELIQUIS ) 5 MG TABS tablet Take 1 tablet by mouth 2 (two) times daily. cardio     atorvastatin  (LIPITOR) 80 MG tablet Take 1 tablet (80 mg total) by mouth daily. For cholesterol. Will need to be seen in office for more refills. 90 tablet 0   cyclobenzaprine  (FLEXERIL ) 5 MG tablet TAKE 1 TABLET BY MOUTH 2 TIMES DAILY AS NEEDED. 30 tablet 0   furosemide (LASIX) 20 MG tablet Take 20 mg by mouth as needed. cardio     metoprolol  succinate (TOPROL -XL) 25 MG 24 hr tablet Take 12.5 mg by mouth daily. cardio     nitroGLYCERIN (NITROSTAT) 0.4 MG SL tablet Place under the tongue. cardio     pantoprazole  (PROTONIX ) 40 MG tablet TAKE 1 TABLET BY MOUTH EVERY DAY 90 tablet 1   sertraline  (ZOLOFT ) 25 MG tablet TAKE 1 TABLET (25 MG TOTAL) BY MOUTH DAILY. 90 tablet 1   lisinopril  (ZESTRIL ) 5 MG tablet Take 1 tablet (5 mg total) by mouth daily. cardio 90 tablet 0   No current facility-administered medications on file prior to visit.    Allergies  Allergen Reactions   Gluten Meal     Other reaction(s): GI Reaction   Methylprednisolone  Other (See Comments)    Injectable form: caused body jerks/spasms - in [place] [facility]   Xyzal  [Levocetirizine] Other (See Comments)    Causes nose bleeds    Past Medical History:  Diagnosis Date   Depression    GERD (gastroesophageal reflux disease)    Headache    Hiatal hernia    Hyperlipemia    Hypertension    Multiple  gastric ulcers    Myocardial infarction [facility])    STEMI (ST elevation myocardial infarction) (HCC) 04/10/2018    Past Surgical History:  Procedure Laterality Date   CHOLECYSTECTOMY  2010   COLONOSCOPY W/ ENDOSCOPIC US   10/17/2017   ESOPHAGOGASTRODUODENOSCOPY (EGD) WITH PROPOFOL  N/A 04/28/2020   Procedure: ESOPHAGOGASTRODUODENOSCOPY (EGD) WITH PROPOFOL ;  Surgeon: Unk Corinn Skiff, MD;  Location: [facility] ENDOSCOPY;  Service: Gastroenterology;  Laterality: N/A;   wisdon tooth extraction  2024    Family History  Problem Relation Age of Onset   Dementia Mother    Diabetes Mother    Dementia Father    Diabetes Father    Heart disease Father    Cancer Sister        unknown type   Diabetes Sister    Hyperlipidemia Sister    Cervical cancer Sister    Diabetes Brother     Social History   Socioeconomic History   Marital status: Married    Spouse name: Lynwood   Number of children: 1   Years of education: some college   Highest education level: Not on file  Occupational History   Not on file  Tobacco Use   Smoking status: Never   Smokeless tobacco: Never  Vaping Use   Vaping status: Never Used  Substance and Sexual Activity   Alcohol use: No   Drug use: No   Sexual activity: Not Currently  Other Topics Concern   Not on file  Social History Narrative   01/16/20   From: [place] originally, moved 2021 to be near daughter   Living: with husband, Lynwood [phone])   Work: retired Occupational hygienist      Family: daughter Josette      Enjoys: reading, games, walking the park       Exercise: walking 2-3 times a week   Diet: tires to do heart healthy diet      Safety   Seat belts: Yes    Guns: Yes  and secure   Safe in relationships: Yes    Social Drivers of Corporate investment banker Strain: Medium Risk (07/17/2023)   Overall Financial Resource Strain (CARDIA)    Difficulty of Paying Living Expenses: Somewhat hard  Food Insecurity: No Food Insecurity (07/17/2023)   Hunger  Vital Sign    Worried About Running Out of Food in the Last Year: Never true    Ran Out of Food in the Last Year: Never true  Transportation Needs: No Transportation Needs (07/17/2023)   PRAPARE - Administrator, Civil Service (Medical): No    Lack of Transportation (Non-Medical): No  Physical Activity: Insufficiently Active (07/17/2023)   Exercise Vital Sign    Days of Exercise per Week: 3 days    Minutes of Exercise per Session: 20 min  Stress: No Stress Concern Present (07/17/2023)   [employer] of Occupational Health - Occupational Stress Questionnaire    Feeling of Stress: Not at all  Social Connections: Moderately Isolated (07/17/2023)   Social Connection and Isolation Panel    Frequency of Communication with Friends and Family: More than three times a week    Frequency of Social Gatherings with Friends and Family: Once a week    Attends Religious Services: Never    Database administrator or Organizations: No    Attends Banker Meetings: Never    Marital Status: Married  Catering manager Violence: Not At Risk (08/16/2023)   Received from [facility]   Humiliation, Afraid, Rape, and Kick questionnaire    Within the last year, have you been afraid of your partner or ex-partner?: No    Within the last year, have you been humiliated or emotionally abused in other ways by your partner or ex-partner?: No    Within the last year, have you been kicked, hit, slapped, or otherwise physically hurt by your partner or ex-partner?: No    Within the last year, have you been raped or forced to have any kind of sexual activity by your partner or ex-partner?: No   Review of Systems No fever, rhinorrhea, cough Did go swimming last week--but this started before (chlorinated pool) No recent travel     Objective:   Physical Exam HENT:     Head:     Comments: ?slight maxillary tenderness    Ears:     Comments: Very slight irritation in canals TMs look normal No  cerumen    Nose:     Comments: Mild swelling    Mouth/Throat:     Pharynx: No oropharyngeal exudate or posterior oropharyngeal erythema.  Musculoskeletal:     Cervical back: Neck supple.  Lymphadenopathy:     Cervical: No cervical adenopathy.            Assessment & Plan:

## 2023-08-28 NOTE — Assessment & Plan Note (Addendum)
 No clear findings to explain her discomfort and sense of blockage No external otitis or cerumen No clear fluid behind TMs but this might explain her symptoms Will set back up with ENT--has seen Dr Milissa in the past (she was able to get an appointment)

## 2023-08-30 ENCOUNTER — Other Ambulatory Visit: Payer: Self-pay | Admitting: Otolaryngology

## 2023-08-30 DIAGNOSIS — R42 Dizziness and giddiness: Secondary | ICD-10-CM

## 2023-09-04 ENCOUNTER — Ambulatory Visit
Admission: RE | Admit: 2023-09-04 | Discharge: 2023-09-04 | Disposition: A | Discharge status: Home / Self Care | Source: Ambulatory Visit | Attending: Otolaryngology | Admitting: Otolaryngology

## 2023-09-04 ENCOUNTER — Encounter: Payer: Medicare Other | Admitting: Nurse Practitioner

## 2023-09-04 DIAGNOSIS — R42 Dizziness and giddiness: Secondary | ICD-10-CM

## 2023-09-04 MED ORDER — GADOPICLENOL 0.5 MMOL/ML IV SOLN
10.0000 mL | Freq: Once | INTRAVENOUS | Status: AC | PRN
  Administered 2023-09-04: 10 mL via INTRAVENOUS

## 2023-09-08 ENCOUNTER — Telehealth: Payer: Self-pay | Admitting: Nurse Practitioner

## 2023-09-08 NOTE — Telephone Encounter (Signed)
 Copied from CRM [id number]. Topic: Referral - Request for Referral >> Sep 08, 2023 10:44 AM Rea BROCKS wrote: Did the patient discuss referral with their provider in the last year? Yes  Appointment offered? Patient was referred to Dr. Milissa. Then Dr. Milissa sent the referral to [facility] but they will only accept a referral from patient's provide NP [facility]. The office won't schedule patient until they have a referral from us . Patient is asking if the referral can be expedited so she can be scheduled as soon as possible.   Type of order/referral and detailed reason for visit: Neurology   Preference of office, provider, location: [facility]. Fax number: [phone]  If referral order, have you been seen by this specialty before? Yes (If Yes, this issue or another issue? When? Where?  Can we respond through MyChart? Yes

## 2023-09-12 ENCOUNTER — Other Ambulatory Visit: Payer: Self-pay | Admitting: Family

## 2023-09-12 ENCOUNTER — Telehealth: Payer: Self-pay

## 2023-09-12 DIAGNOSIS — G43001 Migraine without aura, not intractable, with status migrainosus: Secondary | ICD-10-CM

## 2023-09-12 NOTE — Telephone Encounter (Signed)
 Copied from CRM [id number]. Topic: Referral - Request for Referral >> Sep 08, 2023 10:44 AM Joanne Clark wrote: Did the patient discuss referral with their provider in the last year? Yes  Appointment offered? Patient was referred to Dr. Milissa. Then Dr. Milissa sent the referral to [facility] but they will only accept a referral from patient's provide NP [facility]. The office won't schedule patient until they have a referral from us . Patient is asking if the referral can be expedited so she can be scheduled as soon as possible.   Type of order/referral and detailed reason for visit: Neurology   Preference of office, provider, location: [facility]. Fax number: [phone]  If referral order, have you been seen by this specialty before? Yes (If Yes, this issue or another issue? When? Where?  Can we respond through MyChart? Yes

## 2023-09-20 ENCOUNTER — Encounter: Payer: Self-pay | Admitting: *Deleted

## 2023-09-21 ENCOUNTER — Ambulatory Visit: Admitting: Nurse Practitioner

## 2023-09-21 ENCOUNTER — Encounter: Payer: Self-pay | Admitting: Nurse Practitioner

## 2023-09-21 VITALS — BP 118/66 | HR 75 | Temp 98.3°F | Ht 67.25 in | Wt 236.2 lb

## 2023-09-21 DIAGNOSIS — E782 Mixed hyperlipidemia: Secondary | ICD-10-CM | POA: Diagnosis not present

## 2023-09-21 DIAGNOSIS — F320 Major depressive disorder, single episode, mild: Secondary | ICD-10-CM | POA: Diagnosis not present

## 2023-09-21 DIAGNOSIS — K279 Peptic ulcer, site unspecified, unspecified as acute or chronic, without hemorrhage or perforation: Secondary | ICD-10-CM | POA: Diagnosis not present

## 2023-09-21 DIAGNOSIS — I252 Old myocardial infarction: Secondary | ICD-10-CM | POA: Diagnosis not present

## 2023-09-21 DIAGNOSIS — I4891 Unspecified atrial fibrillation: Secondary | ICD-10-CM | POA: Diagnosis not present

## 2023-09-21 DIAGNOSIS — Z0000 Encounter for general adult medical examination without abnormal findings: Secondary | ICD-10-CM | POA: Diagnosis not present

## 2023-09-21 DIAGNOSIS — G43009 Migraine without aura, not intractable, without status migrainosus: Secondary | ICD-10-CM | POA: Diagnosis not present

## 2023-09-21 DIAGNOSIS — Z131 Encounter for screening for diabetes mellitus: Secondary | ICD-10-CM

## 2023-09-21 DIAGNOSIS — E669 Obesity, unspecified: Secondary | ICD-10-CM

## 2023-09-21 LAB — CBC WITH DIFFERENTIAL/PLATELET
Basophils Absolute: 0.1 K/uL (ref 0.0–0.1)
Basophils Relative: 0.9 % (ref 0.0–3.0)
Eosinophils Absolute: 0.2 K/uL (ref 0.0–0.7)
Eosinophils Relative: 3.1 % (ref 0.0–5.0)
HCT: 40.1 % (ref 36.0–46.0)
Hemoglobin: 13.2 g/dL (ref 12.0–15.0)
Lymphocytes Relative: 24 % (ref 12.0–46.0)
Lymphs Abs: 1.6 K/uL (ref 0.7–4.0)
MCHC: 32.8 g/dL (ref 30.0–36.0)
MCV: 90.1 fl (ref 78.0–100.0)
Monocytes Absolute: 0.4 K/uL (ref 0.1–1.0)
Monocytes Relative: 6.5 % (ref 3.0–12.0)
Neutro Abs: 4.3 K/uL (ref 1.4–7.7)
Neutrophils Relative %: 65.5 % (ref 43.0–77.0)
Platelets: 238 K/uL (ref 150.0–400.0)
RBC: 4.45 Mil/uL (ref 3.87–5.11)
RDW: 13.9 % (ref 11.5–15.5)
WBC: 6.6 K/uL (ref 4.0–10.5)

## 2023-09-21 LAB — COMPREHENSIVE METABOLIC PANEL WITH GFR
ALT: 30 U/L (ref 0–35)
AST: 20 U/L (ref 0–37)
Albumin: 4 g/dL (ref 3.5–5.2)
Alkaline Phosphatase: 109 U/L (ref 39–117)
BUN: 21 mg/dL (ref 6–23)
CO2: 31 meq/L (ref 19–32)
Calcium: 9.4 mg/dL (ref 8.4–10.5)
Chloride: 104 meq/L (ref 96–112)
Creatinine, Ser: 0.78 mg/dL (ref 0.40–1.20)
GFR: 77 mL/min (ref >60.00)
Glucose, Bld: 133 mg/dL — ABNORMAL HIGH (ref 70–99)
Potassium: 4.1 meq/L (ref 3.5–5.1)
Sodium: 140 meq/L (ref 135–145)
Total Bilirubin: 0.5 mg/dL (ref 0.2–1.2)
Total Protein: 6.6 g/dL (ref 6.0–8.3)

## 2023-09-21 LAB — LIPID PANEL
Cholesterol: 151 mg/dL (ref 0–200)
HDL: 65.1 mg/dL (ref >39.00)
LDL Cholesterol: 58 mg/dL (ref 0–99)
NonHDL: 86.35
Total CHOL/HDL Ratio: 2
Triglycerides: 143 mg/dL (ref 0.0–149.0)
VLDL: 28.6 mg/dL (ref 0.0–40.0)

## 2023-09-21 LAB — HEMOGLOBIN A1C: Hgb A1c MFr Bld: 7.3 % — ABNORMAL HIGH (ref 4.6–6.5)

## 2023-09-21 LAB — TSH: TSH: 2.77 u[IU]/mL (ref 0.35–5.50)

## 2023-09-21 MED ORDER — CYCLOBENZAPRINE HCL 5 MG PO TABS
5.0000 mg | ORAL_TABLET | Freq: Two times a day (BID) | ORAL | 0 refills | Status: DC | PRN
Start: 2023-09-21 — End: 2023-11-08

## 2023-09-21 NOTE — Assessment & Plan Note (Signed)
 Discussed age-appropriate immunizations and screening exams.  Did review patient's personal, surgical, social, family histories.  Patient is up-to-date on all age-appropriate vaccinations she would like.  Patient declined flu and pneumonia vaccine today.  Patient is up-to-date on CRC screening, breast cancer screening, cervical cancer screening.  Patient's bone density scan is also up-to-date.  Patient was given information at discharge about preventative healthcare maintenance with anticipatory guidance.

## 2023-09-21 NOTE — Assessment & Plan Note (Signed)
 Pending TSH, A1c, lipid panel.

## 2023-09-21 NOTE — Patient Instructions (Signed)
 Nice to see you today I will be in touch with the labs once I have them Follow up with me in 1 year, sooner if you need me

## 2023-09-21 NOTE — Assessment & Plan Note (Signed)
 History of the same.  Patient denies HI/SI/AVH.  She has tried therapy in the past which was beneficial.  Currently maintained on sertraline  25 mg daily.  Offered to increase dose patient politely declined.

## 2023-09-21 NOTE — Assessment & Plan Note (Signed)
 History of the same.  Currently maintained on apixaban  5 mg twice daily and metoprolol  12.5 mg daily.  Followed by cardiology.  Continue medications as prescribed

## 2023-09-21 NOTE — Assessment & Plan Note (Signed)
History of the same.  Pending lipid panel 

## 2023-09-21 NOTE — Assessment & Plan Note (Signed)
 History of same.  Patient has had endoscopy in the past.  Continue Protonix  40 mg daily

## 2023-09-21 NOTE — Progress Notes (Signed)
 Established Patient Office Visit  Subjective   Patient ID: Landrey Mahurin, female    DOB: October 21, 1955  Age: 68 y.o. MRN: [id number]  Chief Complaint  Patient presents with   Annual Exam    Declines    Medication Refill    Flexeril      HPI  Atrial fibrillation: Patient currently maintained on apixaban  5 mg twice daily, 20 mg of Lasix as needed, 12.5 mg of metoprolol  daily.  Patient is followed by cardiology  CHF: Currently maintained on metoprolol  12.5 mg daily followed by cardiology  Peptic ulcer disease: Patient currently maintained on Protonix  40 mg daily.  Patient does have a history of endoscopy  Depression: Patient currently maintained on sertraline  25 mg daily. She did do therapy for approx 10 months. States that it did help.  Migraine: Patient currently maintained on cyclobenzaprine  5 mg twice daily as needed. States that she will have them more often because of stress.   for complete physical and follow up of chronic conditions.  Immunizations: -Tetanus: Completed in 2012 -Influenza: refused  -Shingles: Completed for vaccine.needs seocnd dose  -Pneumonia: Completed 2020.  Update at the pharmacy   Diet: Fair diet. She is eating 2 meals a day and sometimes a healthy snack.she is drinking chai tea with milk and alternative sweetner and water.  She will have some boost 1-2 Exercise: No regular exercise.  Eye exam: Completes annually. Wears glasses  Dental exam:  Has established with new dentist   Colonoscopy: Completed in 10/30/2017, repeat 10 years Lung Cancer Screening: N/A  Pap smear: 05/25/2020, up-to-date but can discontinue because of  age  Mammogram: 10/20/2022  DEXA: 10/20/2022  Advance directive: does not have them  Sleep: goes to bed late. She will try and go by 10-11 and then she will wake up around 7ish. She gets 6-8 hours       Review of Systems  Constitutional:  Negative for chills and fever.  Respiratory:  Negative for shortness of breath.    Cardiovascular:  Negative for chest pain and leg swelling.  Gastrointestinal:  Negative for abdominal pain, blood in stool, constipation, diarrhea, nausea and vomiting.       BM daily   Genitourinary:  Negative for dysuria and hematuria.  Neurological:  Positive for headaches. Negative for dizziness and tingling.  Psychiatric/Behavioral:  Negative for hallucinations and suicidal ideas.       Objective:     BP 118/66   Pulse 75   Temp 98.3 F (36.8 C) (Oral)   Ht 5' 7.25 (1.708 m)   Wt 236 lb 3.2 oz (107.1 kg)   SpO2 98%   BMI 36.72 kg/m  BP Readings from Last 3 Encounters:  09/21/23 118/66  08/28/23 108/80  07/17/23 112/76   Wt Readings from Last 3 Encounters:  09/21/23 236 lb 3.2 oz (107.1 kg)  08/28/23 234 lb (106.1 kg)  07/17/23 225 lb (102.1 kg)   SpO2 Readings from Last 3 Encounters:  09/21/23 98%  08/28/23 98%  03/30/23 97%      Physical Exam Vitals and nursing note reviewed.  Constitutional:      Appearance: Normal appearance.  HENT:     Right Ear: Tympanic membrane, ear canal and external ear normal.     Left Ear: Tympanic membrane, ear canal and external ear normal.     Mouth/Throat:     Mouth: Mucous membranes are moist.     Pharynx: Oropharynx is clear.  Eyes:     Extraocular Movements: Extraocular movements intact.  Pupils: Pupils are equal, round, and reactive to light.  Cardiovascular:     Rate and Rhythm: Normal rate and regular rhythm.     Pulses: Normal pulses.     Heart sounds: Normal heart sounds.  Pulmonary:     Effort: Pulmonary effort is normal.     Breath sounds: Normal breath sounds.  Abdominal:     General: Bowel sounds are normal. There is no distension.     Palpations: There is no mass.     Tenderness: There is no abdominal tenderness.     Hernia: No hernia is present.  Musculoskeletal:     Right lower leg: No edema.     Left lower leg: No edema.  Lymphadenopathy:     Cervical: No cervical adenopathy.  Skin:     General: Skin is warm.  Neurological:     General: No focal deficit present.     Mental Status: She is alert.     Deep Tendon Reflexes:     Reflex Scores:      Bicep reflexes are 2+ on the right side and 2+ on the left side.      Patellar reflexes are 2+ on the right side and 2+ on the left side.    Comments: Bilateral upper and lower extremity strength 5/5  Psychiatric:        Mood and Affect: Mood normal.        Behavior: Behavior normal.        Thought Content: Thought content normal.        Judgment: Judgment normal.      No results found for any visits on 09/21/23.    The ASCVD Risk score (Arnett DK, et al., 2019) failed to calculate for the following reasons:   Risk score cannot be calculated because patient has a medical history suggesting prior/existing ASCVD    Assessment & Plan:   Problem List Items Addressed This Visit       Cardiovascular and Mediastinum   Atrial fibrillation (HCC)   History of the same.  Currently maintained on apixaban  5 mg twice daily and metoprolol  12.5 mg daily.  Followed by cardiology.  Continue medications as prescribed      Relevant Orders   CBC with Differential/Platelet   Comprehensive metabolic panel with GFR   TSH   Migraine headache   History of the same.  Cyclobenzaprine  helps on occasion.  Patient does have appointment with neurology for further evaluation neurological exam benign in office      Relevant Medications   cyclobenzaprine  (FLEXERIL ) 5 MG tablet     Digestive   PUD (peptic ulcer disease)   History of same.  Patient has had endoscopy in the past.  Continue Protonix  40 mg daily        Other   History of ST elevation myocardial infarction (STEMI)   Follow-up cardiology continue risk reduction      Relevant Orders   Lipid panel   Hemoglobin A1c   Mixed hyperlipidemia   History of the same.  Pending lipid panel      Relevant Orders   Lipid panel   Hemoglobin A1c   Major depressive disorder   History  of the same.  Patient denies HI/SI/AVH.  She has tried therapy in the past which was beneficial.  Currently maintained on sertraline  25 mg daily.  Offered to increase dose patient politely declined.      Relevant Orders   TSH   Obesity (BMI 30-39.9)   Pending  TSH, A1c, lipid panel.      Relevant Orders   Lipid panel   Hemoglobin A1c   Preventative health care - Primary   Discussed age-appropriate immunizations and screening exams.  Did review patient's personal, surgical, social, family histories.  Patient is up-to-date on all age-appropriate vaccinations she would like.  Patient declined flu and pneumonia vaccine today.  Patient is up-to-date on CRC screening, breast cancer screening, cervical cancer screening.  Patient's bone density scan is also up-to-date.  Patient was given information at discharge about preventative healthcare maintenance with anticipatory guidance.      Other Visit Diagnoses       Screening for diabetes mellitus       Relevant Orders   Hemoglobin A1c       No follow-ups on file.    Adina Crandall, NP

## 2023-09-21 NOTE — Assessment & Plan Note (Signed)
 Follow-up cardiology continue risk reduction

## 2023-09-21 NOTE — Assessment & Plan Note (Signed)
 History of the same.  Cyclobenzaprine  helps on occasion.  Patient does have appointment with neurology for further evaluation neurological exam benign in office

## 2023-09-25 ENCOUNTER — Ambulatory Visit: Payer: Self-pay | Admitting: Nurse Practitioner

## 2023-09-25 DIAGNOSIS — E1165 Type 2 diabetes mellitus with hyperglycemia: Secondary | ICD-10-CM

## 2023-09-25 MED ORDER — LANCETS MISC. MISC
1.0000 | Freq: Every day | 0 refills | Status: AC
Start: 2023-09-25 — End: ?

## 2023-09-25 MED ORDER — BLOOD GLUCOSE MONITORING SUPPL DEVI
1.0000 | Freq: Every day | 0 refills | Status: AC
Start: 2023-09-25 — End: ?

## 2023-09-25 MED ORDER — BLOOD GLUCOSE TEST VI STRP
1.0000 | ORAL_STRIP | Freq: Every day | 0 refills | Status: AC
Start: 2023-09-25 — End: ?

## 2023-09-25 MED ORDER — METFORMIN HCL ER 500 MG PO TB24: 500.0000 mg | ORAL_TABLET | Freq: Every day | ORAL | 0 refills | Status: DC

## 2023-09-25 MED ORDER — LANCET DEVICE MISC: 1.0000 | Freq: Every day | 0 refills | Status: AC

## 2023-10-20 ENCOUNTER — Other Ambulatory Visit: Payer: Self-pay | Admitting: Nurse Practitioner

## 2023-10-20 DIAGNOSIS — K279 Peptic ulcer, site unspecified, unspecified as acute or chronic, without hemorrhage or perforation: Secondary | ICD-10-CM

## 2023-11-06 ENCOUNTER — Ambulatory Visit (INDEPENDENT_AMBULATORY_CARE_PROVIDER_SITE_OTHER)
Admission: RE | Admit: 2023-11-06 | Discharge: 2023-11-06 | Disposition: A | Discharge status: Home / Self Care | Source: Ambulatory Visit | Attending: Family Medicine | Admitting: Family Medicine

## 2023-11-06 ENCOUNTER — Ambulatory Visit: Payer: Self-pay | Admitting: Family Medicine

## 2023-11-06 ENCOUNTER — Ambulatory Visit: Admitting: Family Medicine

## 2023-11-06 ENCOUNTER — Encounter: Payer: Self-pay | Admitting: Family Medicine

## 2023-11-06 VITALS — BP 110/68 | HR 80 | Temp 98.3°F | Ht 67.25 in | Wt 232.5 lb

## 2023-11-06 DIAGNOSIS — M79631 Pain in right forearm: Secondary | ICD-10-CM | POA: Diagnosis not present

## 2023-11-06 DIAGNOSIS — M25521 Pain in right elbow: Secondary | ICD-10-CM

## 2023-11-06 NOTE — Patient Instructions (Signed)
 It you can, apply a cold compress to elbow and forarm when able for 10 minutes at a time  Elevate arm when you can   Try voltaren  gel 1% to the painful areas up to 4 times daily    Xrays now  We will reach out with a result and plan

## 2023-11-06 NOTE — Assessment & Plan Note (Addendum)
 Trauma to mid right ulna about 10 d ago  Hematoma persists with tenderness Xray today to r/o fracture : neg for fracture   Encouraged use of ice  Compression if helpful Elevation  Voltaren  gel over the counter   Will plan visit with sport med

## 2023-11-06 NOTE — Progress Notes (Signed)
 Subjective:    Patient ID: Joanne Clark, female    DOB: 12-19-1955, 68 y.o.   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 right arm /elbow injury   10-12 days   PMHx notable for PUD, shoulder impingement and CHF Also balance problems Is right handed    Banged her arm against a hard counter top  Between wrist and elbow  Swollen area Some popping   Then on vacation -hit her elbow on a door several times  This made her symptoms worse   Painful to lift with that arm (even glass of oj)   Prior to that in middle of move and was reped lifting things   Aches  Can be sharp with hand movement  Can radiate up to shoulder  Keeps her awake at night  Some tingling  Worsened by movement of hands also     Over the counter  Tylenol  did not help  No heat or ice-does not like anything to touch it  Has not wrapped it     Imaging  DG Elbow 2 Views Right Result Date: 11/06/2023 EXAM: 2 VIEW(S) XRAY OF THE RIGHT ELBOW COMPARISON: None available. CLINICAL HISTORY: Lateral elbow pain with tenderness, both recurrent trauma and repeated movement. Trauma to mid ulna area - lump and soreness. Right. FINDINGS: BONES AND JOINTS: No acute fracture. No focal osseous lesion. No joint dislocation. No joint effusion. SOFT TISSUES: The soft tissues are unremarkable. IMPRESSION: 1. No acute abnormality. Electronically signed by: Waddell Calk MD 11/06/2023 01:50 PM EDT RP Workstation: [id number]   DG Forearm Right Result Date: 11/06/2023 EXAM: 2 VIEW(S) XRAY OF THE RIGHT FOREARM 11/06/2023 12:17:50 PM COMPARISON: None available. CLINICAL HISTORY: Right trauma to mid ulna area -lump and soreness. FINDINGS: BONES AND JOINTS: No acute fracture. No  focal osseous lesion. No joint dislocation. SOFT TISSUES: The soft tissues are unremarkable. IMPRESSION: 1. No acute fracture or dislocation. Electronically signed by: Waddell Calk MD 11/06/2023 01:49 PM EDT RP Workstation: [id number]      Patient Active Problem List   Diagnosis Date Noted   Right forearm pain 11/06/2023   Right elbow pain 11/06/2023   Ear discomfort, bilateral 08/28/2023   Acute sinusitis 03/20/2023   Shoulder impingement syndrome, right 11/03/2022   Jaw pain 11/03/2022   Obesity (BMI 30-39.9) 09/12/2022   Preventative health care 09/12/2022   Foot pain, bilateral 07/11/2022   Medication side effect 04/27/2022   Dental infection 03/01/2022   Pain in both lower extremities 12/03/2021   Acute pain of both knees 12/03/2021   Rash 12/03/2021   Abdominal discomfort 12/03/2021   Other dysphagia 12/03/2021   Incidental pulmonary nodule, > 3mm and < 8mm 02/11/2021   Pain of meniscus of right knee 02/10/2021   Right knee injury, initial encounter 02/10/2021   Ankle swelling, right 10/22/2020   Muscle strain of right lower leg 06/17/2020   Parotid nodule 05/04/2020   Major depressive disorder 05/04/2020   Pain of upper abdomen    Stress and adjustment reaction 01/16/2020   Mixed hyperlipidemia 02/14/2019   Atrial fibrillation (HCC) 05/20/2018   Hiatal hernia 05/20/2018   Migraine headache 05/20/2018  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   Presence of stent in coronary artery 05/20/2018   PUD (peptic ulcer disease) 05/20/2018   History of ST elevation myocardial infarction (STEMI) 04/10/2018   History of cholecystectomy 01/28/2010   Past Medical History:  Diagnosis Date   Depression    GERD (gastroesophageal reflux disease)    Headache    Hiatal hernia    Hyperlipemia    Hypertension    Multiple gastric ulcers    Myocardial infarction [facility])    STEMI (ST elevation myocardial infarction) (HCC) 04/10/2018   Past Surgical  History:  Procedure Laterality Date   CHOLECYSTECTOMY  2010   COLONOSCOPY W/ ENDOSCOPIC US   10/17/2017   ESOPHAGOGASTRODUODENOSCOPY (EGD) WITH PROPOFOL  N/A 04/28/2020   Procedure: ESOPHAGOGASTRODUODENOSCOPY (EGD) WITH PROPOFOL ;  Surgeon: Unk Corinn Skiff, MD;  Location: [facility] ENDOSCOPY;  Service: Gastroenterology;  Laterality: N/A;   [place] tooth extraction  2024   Social History   Tobacco Use   Smoking status: Never   Smokeless tobacco: Never  Vaping Use   Vaping status: Never Used  Substance Use Topics   Alcohol use: No   Drug use: No   Family History  Problem Relation Age of Onset   Dementia Mother    Diabetes Mother    Dementia Father    Diabetes Father    Heart disease Father    Cancer Sister        unknown type   Diabetes Sister    Hyperlipidemia Sister    Cervical cancer Sister    Diabetes Brother    Allergies  Allergen Reactions   Gluten Meal     Other reaction(s): GI Reaction   Methylprednisolone  Other (See Comments)    Injectable form: caused body jerks/spasms - in [place] [facility]   Xyzal  [Levocetirizine] Other (See Comments)    Causes nose bleeds   Current Outpatient Medications on File Prior to Visit  Medication Sig Dispense Refill   apixaban  (ELIQUIS ) 5 MG TABS tablet Take 1 tablet by mouth 2 (two) times daily. cardio     atorvastatin  (LIPITOR) 80 MG tablet Take 1 tablet (80 mg total) by mouth daily. For cholesterol. Will need to be seen in office for more refills. 90 tablet 0   Blood Glucose Monitoring Suppl DEVI 1 each by Does not apply route daily. May substitute to any manufacturer covered by patient's insurance. 1 each 0   cyclobenzaprine  (FLEXERIL ) 5 MG tablet Take 1 tablet (5 mg total) by mouth 2 (two) times daily as needed for muscle spasms. 30 tablet 0   furosemide (LASIX) 20 MG tablet Take 20 mg by mouth as needed. cardio     Glucose Blood (BLOOD GLUCOSE TEST STRIPS) STRP 1 each by In Vitro route daily. May substitute to any manufacturer covered  by patient's insurance. 100 strip 0   Lancets Misc. MISC 1 each by Does not apply route daily. May substitute to any manufacturer covered by patient's insurance. 100 each 0   metFORMIN  (GLUCOPHAGE -XR) 500 MG 24 hr tablet Take 1 tablet (500 mg total) by mouth daily with breakfast. 90 tablet 0   metoprolol  succinate (TOPROL -XL) 25 MG 24 hr tablet Take 12.5 mg by mouth daily. cardio     nitroGLYCERIN (NITROSTAT) 0.4 MG SL tablet Place under the tongue. cardio     pantoprazole  (PROTONIX ) 40 MG tablet TAKE 1 TABLET BY MOUTH EVERY DAY 90 tablet 1   sertraline  (ZOLOFT ) 25 MG tablet TAKE 1 TABLET (25 MG TOTAL) BY MOUTH  DAILY. 90 tablet 1   No current facility-administered medications on file prior to visit.    Review of Systems  Constitutional:  Negative for fatigue.  Musculoskeletal:  Positive for arthralgias. Negative for joint swelling, neck pain and neck stiffness.  Skin:  Negative for wound.  Neurological:  Negative for weakness and numbness.       Objective:   Physical Exam Constitutional:      General: She is not in acute distress.    Appearance: Normal appearance. She is obese. She is not ill-appearing or diaphoretic.  Cardiovascular:     Rate and Rhythm: Normal rate.  Pulmonary:     Effort: Pulmonary effort is normal. No respiratory distress.     Breath sounds: No wheezing.  Musculoskeletal:     Comments: Swelling over mid right ulna with old ecchymosis at site of former trauma  Tender in this area  Also tenderness of lateral epicondyle right  Pain with pronation/supination  Pain with full elbow flexion  Normal wrist rom but this causes forearm discomfort  No neuro changes  Normal perfusion of right hand     Skin:    Coloration: Skin is not jaundiced.     Findings: No bruising.  Neurological:     Mental Status: She is alert.     Sensory: No sensory deficit.     Motor: No weakness.  Psychiatric:        Mood and Affect: Mood normal.           Assessment & Plan:    Problem List Items Addressed This Visit       Other   Right forearm pain - Primary   Trauma to mid right ulna about 10 d ago  Hematoma persists with tenderness Xray today to r/o fracture : neg for fracture   Encouraged use of ice  Compression if helpful Elevation  Voltaren  gel over the counter   Will plan visit with sport med       Relevant Orders   DG Forearm Right (Completed)   DG Elbow 2 Views Right (Completed)   Right elbow pain   Pain in lateral epicondyle after repetitive lifting /packing  Then worse after trauma to mid forearm  Notable tenderness  C/o tingling but no focal neuro signs   Xray today no fracture or abnormalities  Recommend use of ice/cold comp prn  Elevation Voltaren  gel  Disc symptomatic care - see instructions on AVS   Will plan follow up with sport med        Relevant Orders   DG Forearm Right (Completed)   DG Elbow 2 Views Right (Completed)

## 2023-11-06 NOTE — Assessment & Plan Note (Addendum)
 Pain in lateral epicondyle after repetitive lifting /packing  Then worse after trauma to mid forearm  Notable tenderness  C/o tingling but no focal neuro signs   Xray today no fracture or abnormalities  Recommend use of ice/cold comp prn  Elevation Voltaren  gel  Disc symptomatic care - see instructions on AVS   Will plan follow up with sport med

## 2023-11-07 NOTE — Telephone Encounter (Signed)
 Pt scheduled with Joanne Clark

## 2023-11-08 ENCOUNTER — Other Ambulatory Visit: Payer: Self-pay | Admitting: Nurse Practitioner

## 2023-11-08 DIAGNOSIS — G43009 Migraine without aura, not intractable, without status migrainosus: Secondary | ICD-10-CM

## 2023-11-12 NOTE — Progress Notes (Signed)
 "    Joanne Galeno T. Jeshurun Oaxaca, MD, CAQ Sports Medicine [facility] [address] [place], [address]  Phone: [phone]  FAX: [phone]  Joanne Clark - 68 y.o. female  MRN [id number]  Date of Birth: December 11, 1955  Date: 11/13/2023  PCP: Wendee Lynwood HERO, NP  Referral: Wendee Lynwood HERO, NP  Chief Complaint  Patient presents with   Elbow Pain    Right   Arm Pain    Right Forearm    Subjective:   Joanne Clark is a 68 y.o. very pleasant female patient with Body mass index is 36.26 kg/m. who presents with the following:  Discussed the use of AI scribe software for clinical note transcription with the patient, who gave verbal consent to proceed.  Patient presents with follow-up of right sided arm and elbow injury little bit over 2 weeks ago.  She did strike her arm against a hard countertop and the area between the wrist and elbow and she has had some swelling.  X-rays of the forearm and elbow are normal. While out of town on a trip, she subsequently struck the lateral epicondyle twice after this initial injury.  History of Present Illness Joanne Clark is a 68 year old female with intracranial hypertension who presents with elbow pain and swelling following an injury.  She experienced an injury to her elbow nearly three weeks ago after losing her balance and hitting it against a quartz countertop. She attributes her balance issues to intracranial hypertension. Initially, there was a lump and swelling at the site, which she expected to resolve but has persisted.  It has gotten significantly smaller, but there is a topical textural change in the soft tissue.  The injury was further aggravated when she hit her elbow twice against a door while on vacation, leading to increased pain and swelling. Her husband wrapped the elbow in a towel to prevent further injury. The pain is located at the elbow and radiates down the arm, with a noticeable bump.  Swelling was initially more pronounced and has decreased slightly over time.  She experiences popping in the elbow, which began around the time of the injury. The pain is exacerbated by lifting, rotating the arm, and certain hand movements, such as holding a glass, which sometimes feels weak. She reports difficulty with tasks requiring grip strength and notes that the pain can vary in intensity.  X-rays were performed and reportedly appeared normal. She has tried using Voltaren  and ice for the swelling and pain, but these have provided limited relief. She is currently on Eliquis  and nortriptyline, which limits her use of certain medications for pain management.  Her past medical history includes intracranial hypertension, for which she is being treated. She mentions that her husband has health issues and is unable to assist with heavy lifting, which has been challenging as she is in the process of moving.  She reports no significant pain in the wrist, and while she sometimes experiences tenderness when moving her fingers, she does not have constant finger pain. Pain and swelling are localized to the elbow and upper arm. She reports minimal pain in the tricep, only where pressure is applied, and little pain on the inner side of the arm except when movement causes discomfort elsewhere.    Review of Systems is noted in the HPI, as appropriate  Objective:   BP 100/70   Pulse 99   Temp (!) 97.3 F (36.3 C) (Temporal)   Ht 5' 7.25 (1.708  m)   Wt 233 lb 4 oz (105.8 kg)   SpO2 98%   BMI 36.26 kg/m   GEN: No acute distress; alert,appropriate. PULM: Breathing comfortably in no respiratory distress PSYCH: Normally interactive.    Elbow: R Ecchymosis or edema: neg, but there is a textural prominence in the midportion of the extensor surface of the forearm that coincides with the patient's prior traumatic injury to this soft tissue ROM: full flexion, extension, pronation, supination Shoulder  ROM: Full Flexion: 5/5 Extension: 5/5, PAINFUL Supination: 5/5, PAINFUL Pronation: 5/5 Wrist ext: 5/5 Wrist flexion: 5/5 No gross bony abnormality Varus and Valgus stress: stable ECRB tenderness: YES, TTP Medial epicondyle: NT Lateral epicondyle, resisted wrist extension from wrist full pronation and flexion: PAINFUL grip: 5/5  sensation intact Tinel's, Elbow: negative   Laboratory and Imaging Data:  Assessment and Plan:     ICD-10-CM   1. Right forearm pain  M79.631     2. Right elbow pain  M25.521     3. Lateral epicondylitis of right elbow  M77.11      Assessment & Plan Traumatic lateral epicondylitis (tennis elbow) with right elbow hematoma and swelling Sustained traumatic injury to the right elbow causing lateral epicondylitis and hematoma, exacerbated by further trauma. Tenderness, swelling, and popping likely due to hematoma and potential scar tissue. Recovery expected in six to eight weeks for the resolving hematoma alone. On Apixaban , complicating NSAID use. Steroids contraindicated due to past adverse reaction. 10-15% risk of permanent muscle calcification post-hematoma. - Encouraged gentle range of motion exercises for elbow and fingers. - Instructed on using a towel for resistance exercises and finger movements. - Advised against lifting heavy weights with affected arm. - Consider referral to physical therapy if no improvement by early December.  Right forearm hematoma Right forearm pain secondary to elbow injury.  No fracture on X-ray. Pain likely from soft tissue injury and bone bruising. Bone bruise expected to heal in a minimum of four weeks, but hematoma can take 6 to 8 weeks. - Advised on gentle movement and exercises to maintain range of motion. - Reassured swelling and pain will gradually improve over six to eight weeks. - Schedule follow-up if no improvement by early December.   Disposition: No follow-ups on file.  Dragon [employer] One speech-to-text  software was used for transcription in this dictation.  Possible transcriptional errors can occur using Animal nutritionist.   Signed,  Jacques DASEN. Conor Filsaime, MD   Outpatient Encounter Medications as of 11/13/2023  Medication Sig   apixaban  (ELIQUIS ) 5 MG TABS tablet Take 1 tablet by mouth 2 (two) times daily. cardio   atorvastatin  (LIPITOR) 80 MG tablet Take 1 tablet (80 mg total) by mouth daily. For cholesterol. Will need to be seen in office for more refills.   Blood Glucose Monitoring Suppl DEVI 1 each by Does not apply route daily. May substitute to any manufacturer covered by patient's insurance.   cyclobenzaprine  (FLEXERIL ) 5 MG tablet TAKE 1 TABLET BY MOUTH 2 TIMES DAILY AS NEEDED FOR MUSCLE SPASMS.   furosemide (LASIX) 20 MG tablet Take 20 mg by mouth as needed. cardio   Glucose Blood (BLOOD GLUCOSE TEST STRIPS) STRP 1 each by In Vitro route daily. May substitute to any manufacturer covered by patient's insurance.   Lancets Misc. MISC 1 each by Does not apply route daily. May substitute to any manufacturer covered by patient's insurance.   lisinopril  (ZESTRIL ) 2.5 MG tablet Take 2.5 mg by mouth daily.   metFORMIN  (GLUCOPHAGE -XR) 500 MG  24 hr tablet Take 1 tablet (500 mg total) by mouth daily with breakfast.   metoprolol  succinate (TOPROL -XL) 25 MG 24 hr tablet Take 12.5 mg by mouth daily. cardio   nitroGLYCERIN (NITROSTAT) 0.4 MG SL tablet Place under the tongue. cardio   nortriptyline (PAMELOR) 10 MG capsule Take 10 mg by mouth at bedtime.   pantoprazole  (PROTONIX ) 40 MG tablet TAKE 1 TABLET BY MOUTH EVERY DAY   sertraline  (ZOLOFT ) 25 MG tablet TAKE 1 TABLET (25 MG TOTAL) BY MOUTH DAILY.   No facility-administered encounter medications on file as of 11/13/2023.   "

## 2023-11-13 ENCOUNTER — Encounter: Payer: Self-pay | Admitting: Family Medicine

## 2023-11-13 ENCOUNTER — Ambulatory Visit (INDEPENDENT_AMBULATORY_CARE_PROVIDER_SITE_OTHER): Admitting: Family Medicine

## 2023-11-13 VITALS — BP 100/70 | HR 99 | Temp 97.3°F | Ht 67.25 in | Wt 233.2 lb

## 2023-11-13 DIAGNOSIS — M7711 Lateral epicondylitis, right elbow: Secondary | ICD-10-CM

## 2023-11-13 DIAGNOSIS — M79631 Pain in right forearm: Secondary | ICD-10-CM | POA: Diagnosis not present

## 2023-11-13 DIAGNOSIS — M25521 Pain in right elbow: Secondary | ICD-10-CM

## 2023-11-16 ENCOUNTER — Telehealth: Payer: Self-pay

## 2023-11-16 NOTE — Telephone Encounter (Signed)
 Patient contacted [facility] [place]. She asked the front desk if Dr. Rosslyn did Lumbar Epidural Blood Patches.   I confusingly stated that the patient should contact our [place] office. Upon discussing more with our team and learning that the patient was diagnosed with Intracranial Hypotension.   I called the patient. We discussed that she has been having ongoing issue since July. Dr. Lane her Neurologists ordered a Lumbar Epidural Blood Patch. She states that getting this scheduled has taken longer than she had hoped.   I told her that typically we send patients who need this procedure to [facility] or [place].   I told her that I would call Dr. Clement office to see if they can have her scheduled over at [facility] [place].   I offered patient a follow up appointment with Dr. Janjua. She wants to have the blood patch done first but she plans to come see him for evaluation and second opinion about her care.

## 2023-11-16 NOTE — Telephone Encounter (Signed)
 Received fax referral from [facility] for lumbar epidural blood patch for spontaneous intracranial hypotension. Left message for Charmaine Rector, CMA that this needs to be done through [facility] or [facility] Scheduling.

## 2023-11-22 ENCOUNTER — Other Ambulatory Visit: Payer: Self-pay | Admitting: Neurology

## 2023-11-22 DIAGNOSIS — G96811 Intracranial hypotension, spontaneous: Secondary | ICD-10-CM

## 2023-11-24 ENCOUNTER — Ambulatory Visit: Admitting: Neurosurgery

## 2023-11-24 ENCOUNTER — Encounter: Payer: Self-pay | Admitting: Neurosurgery

## 2023-11-24 VITALS — BP 123/82 | HR 87 | Temp 98.4°F | Ht 67.5 in | Wt 232.4 lb

## 2023-11-24 DIAGNOSIS — G96810 Intracranial hypotension, unspecified: Secondary | ICD-10-CM

## 2023-11-24 DIAGNOSIS — R519 Headache, unspecified: Secondary | ICD-10-CM | POA: Diagnosis not present

## 2023-11-24 DIAGNOSIS — H93299 Other abnormal auditory perceptions, unspecified ear: Secondary | ICD-10-CM

## 2023-11-24 NOTE — Discharge Instructions (Signed)
 Blood Patch Discharge Instructions ? ?Go home and rest quietly for the next 24 hours.  It is important to lie flat for the next 24 hours.  Get up only to go to the restroom.  You may lie in the bed or on a couch on your back, your stomach, your left side or your right side.  You may have one pillow under your head.  You may have pillows between your knees while you are on your side or under your knees while you are on your back. ? ?DO NOT drive today.  Recline the seat as far back as it will go, while still wearing your seat belt, on the way home. ? ?You may get up to go to the bathroom as needed.  You may sit up for 10 minutes to eat.  You may resume your normal diet and medications unless otherwise indicated.  Drink lots of extra fluids today and tomorrow..  ? ?You may resume normal activities after your 24 hours of bed rest is over; however, do not exert yourself strongly or do any heavy lifting tomorrow. ? ?Call your physician for a follow-up appointment.  ? ?If you have any questions  after you arrive home, please call [phone]. ? ?Discharge instructions have been explained to the patient.  The patient, or the person responsible for the patient, fully understands these instructions. ? ?   ?

## 2023-11-24 NOTE — Progress Notes (Signed)
 Assessment : Discussed the use of AI scribe software for clinical note transcription with the patient, who gave verbal consent to proceed.  History of Present Illness Joanne Clark is a 68 year old female who presents with persistent headaches and hearing issues. She was referred by an ENT for evaluation of her persistent headaches and hearing issues.  She experienced an episode of elevated blood pressure after discontinuing lisinopril , which was prescribed at a low dose. She reported being upset due to losing formatting in her book, and subsequently experienced a spike in blood pressure. Subsequently, she developed severe headaches and auditory disturbances, including a high-pitched noise in her ears, initially thought to be a double ear infection. The headaches are bilateral, with constant pain on the right side, exacerbated by actions such as coughing, sneezing, or bending over, and are not consistently present throughout the day. They do not typically worsen as the day progresses unless triggered by specific movements.  Her hearing issues include a high-pitched noise, predominantly in the right ear, worsening later in the day. She notes a possible correlation with fluid intake, though she is uncertain whether dehydration or excessive fluid intake is the cause. A hearing test showed a pattern described by the ENT as arcing up and then down, which the ENT explained to her during the visit. An MRI was performed, leading to a referral to a neurologist. The symptoms have persisted since late July, approximately three and a half months ago.  She experiences frequent sneezing attacks, occurring almost weekly, often in the evening. These episodes may be related to pet dander, as she has three dogs, one of which sheds significantly. She has not been tested for allergies.  Her past medical history includes a heart attack five years ago, attributed to stress while caring for her parents. She is currently  on several medications, including Eliquis  5 mg, which her doctor told her to continue as maintenance after her heart attack. No nausea, word-finding difficulties, or memory problems.    Plan : I think that she is suffering from intracranial hypotension.  Looking back, she states that about a year ago she started having episodes of severe sneezing without any specific cause.  It is unclear whether this might be related to a dog that he has the emotional support dog of her daughters however she has these episodes at least once a week and sneezes about 10-15 times in a row.  This may have been a preceding event when she started noticing the headaches.  These headaches typically happen during Valsalva maneuvers, in particular sneezing and coughing.  She says that if she does not have the spells, the headaches are very manageable but her hearing does decline as the day goes along.  She has been scheduled to have a blood patch done next week and I recommended that she lays flat for about 5 days and during that time consume some caffeinated beverages.  Hopefully that will fix the problem but if does not, then we may have to do a CT myelogram with imaging of the spinal canal.  She does not have any rhinorrhea and therefore it may reside in her spine.  She will call us  and let us  know how she is doing.   Social History   Socioeconomic History   Marital status: Married    Spouse name: Lynwood   Number of children: 1   Years of education: some college   Highest education level: Not on file  Occupational History   Not  on file  Tobacco Use   Smoking status: Never   Smokeless tobacco: Never  Vaping Use   Vaping status: Never Used  Substance and Sexual Activity   Alcohol use: No   Drug use: No   Sexual activity: Not Currently  Other Topics Concern   Not on file  Social History Narrative   01/16/20   From: [place] originally, moved 2021 to be near daughter   Living: with husband, Lynwood [phone])    Work: retired Occupational Hygienist      Family: daughter Josette      Enjoys: reading, games, walking the park       Exercise: walking 2-3 times a week   Diet: tires to do heart healthy diet      Safety   Seat belts: Yes    Guns: Yes  and secure   Safe in relationships: Yes    Social Drivers of Corporate Investment Banker Strain: Low Risk  (11/08/2023)   Received from [facility]   Overall Financial Resource Strain (CARDIA)    Difficulty of Paying Living Expenses: Not hard at all  Food Insecurity: No Food Insecurity (11/08/2023)   Received from [facility]   Hunger Vital Sign    Within the past 12 months, you worried that your food would run out before you got the money to buy more.: Never true    Within the past 12 months, the food you bought just didn't last and you didn't have money to get more.: Never true  Transportation Needs: No Transportation Needs (11/08/2023)   Received from [facility] - Transportation    In the past 12 months, has lack of transportation kept you from medical appointments or from getting medications?: No    Lack of Transportation (Non-Medical): No  Physical Activity: Unknown (09/20/2023)   Exercise Vital Sign    Days of Exercise per Week: Patient declined    Minutes of Exercise per Session: Not on file  Recent Concern: Physical Activity - Insufficiently Active (07/17/2023)   Exercise Vital Sign    Days of Exercise per Week: 3 days    Minutes of Exercise per Session: 20 min  Stress: Patient Declined (09/20/2023)   [employer] of Occupational Health - Occupational Stress Questionnaire    Feeling of Stress: Patient declined  Social Connections: Unknown (09/20/2023)   Social Connection and Isolation Panel    Frequency of Communication with Friends and Family: More than three times a week    Frequency of Social Gatherings with Friends and Family: Patient declined    Attends Religious Services:  Never    Database Administrator or Organizations: Patient declined    Attends Engineer, Structural: Not on file    Marital Status: Married  Recent Concern: Social Connections - Moderately Isolated (07/17/2023)   Social Connection and Isolation Panel    Frequency of Communication with Friends and Family: More than three times a week    Frequency of Social Gatherings with Friends and Family: Once a week    Attends Religious Services: Never    Database Administrator or Organizations: No    Attends Banker Meetings: Never    Marital Status: Married  Catering Manager Violence: Not At Risk (08/16/2023)   Received from [facility]   Humiliation, Afraid, Rape, and Kick questionnaire    Within the last year, have you been afraid of your partner or ex-partner?: No  Within the last year, have you been humiliated or emotionally abused in other ways by your partner or ex-partner?: No    Within the last year, have you been kicked, hit, slapped, or otherwise physically hurt by your partner or ex-partner?: No    Within the last year, have you been raped or forced to have any kind of sexual activity by your partner or ex-partner?: No    Family History  Problem Relation Age of Onset   Dementia Mother    Diabetes Mother    Dementia Father    Diabetes Father    Heart disease Father    Cancer Sister        unknown type   Diabetes Sister    Hyperlipidemia Sister    Cervical cancer Sister    Diabetes Brother     Allergies  Allergen Reactions   Gluten Meal     Other reaction(s): GI Reaction   Methylprednisolone  Other (See Comments)    Injectable form: caused body jerks/spasms - in [place] [facility]   Xyzal  [Levocetirizine] Other (See Comments)    Causes nose bleeds    Past Medical History:  Diagnosis Date   Depression    GERD (gastroesophageal reflux disease)    Headache    Hiatal hernia    Hyperlipemia    Hypertension    Multiple gastric ulcers    Myocardial  infarction [facility])    STEMI (ST elevation myocardial infarction) (HCC) 04/10/2018    Past Surgical History:  Procedure Laterality Date   CHOLECYSTECTOMY  2010   COLONOSCOPY W/ ENDOSCOPIC US   10/17/2017   ESOPHAGOGASTRODUODENOSCOPY (EGD) WITH PROPOFOL  N/A 04/28/2020   Procedure: ESOPHAGOGASTRODUODENOSCOPY (EGD) WITH PROPOFOL ;  Surgeon: Unk Corinn Skiff, MD;  Location: [facility] ENDOSCOPY;  Service: Gastroenterology;  Laterality: N/A;   wisdon tooth extraction  2024     Physical Exam   Physical Exam HENT:     Head: Normocephalic.     Nose: Nose normal.  Eyes:     Pupils: Pupils are equal, round, and reactive to light.  Cardiovascular:     Rate and Rhythm: Normal rate.  Pulmonary:     Effort: Pulmonary effort is normal.  Abdominal:     General: Abdomen is flat.  Musculoskeletal:     Cervical back: Normal range of motion.  Neurological:     Mental Status: She is alert.     Cranial Nerves: Cranial nerves 2-12 are intact.     Sensory: Sensation is intact.     Motor: Motor function is intact.     Coordination: Coordination is intact.     Results for orders placed or performed during the hospital encounter of 07/03/22  CT HEAD WO CONTRAST ( )   Narrative   CLINICAL DATA:  Neck trauma (Age >= 65y); Head trauma, minor (Age >= 65y)  EXAM: CT HEAD WITHOUT CONTRAST  CT CERVICAL SPINE WITHOUT CONTRAST  TECHNIQUE: Multidetector CT imaging of the head and cervical spine was performed following the standard protocol without intravenous contrast. Multiplanar CT image reconstructions of the cervical spine were also generated.  RADIATION DOSE REDUCTION: This exam was performed according to the departmental dose-optimization program which includes automated exposure control, adjustment of the mA and/or kV according to patient size and/or use of iterative reconstruction technique.  COMPARISON:  04/06/2020  FINDINGS: CT HEAD FINDINGS  Brain: No evidence of acute infarction,  hemorrhage, hydrocephalus, extra-axial collection or mass lesion/mass effect.  Vascular: No hyperdense vessel or unexpected calcification.  Skull: Normal. Negative for fracture or  focal lesion.  Sinuses/Orbits: Mucosal thickening of the bilateral maxillary sinuses. No air-fluid level.  Other: Negative for scalp hematoma.  CT CERVICAL SPINE FINDINGS  Alignment: Facet joints are aligned without dislocation or traumatic listhesis. Dens and lateral masses are aligned.  Skull base and vertebrae: No acute fracture. No primary bone lesion or focal pathologic process.  Soft tissues and spinal canal: No prevertebral fluid or swelling. No visible canal hematoma.  Disc levels: Disc heights are maintained. Lower cervical facet arthropathy.  Upper chest: Negative.  Other: Bilateral carotid atherosclerosis.  IMPRESSION: 1. No acute intracranial abnormality. 2. No acute fracture or subluxation of the cervical spine.   Electronically Signed   By: Mabel Converse D.O.   On: 07/03/2022 12:11   Results for orders placed or performed during the hospital encounter of 04/06/20  MR BRAIN WO CONTRAST   Narrative   CLINICAL DATA:  Dizziness, nonspecific. Additional history provided: Patient reports dizziness and weakness which began last night, recent fall hitting right side of shoulder, right shoulder pain that radiates up neck.  EXAM: MRI HEAD WITHOUT CONTRAST  TECHNIQUE: Multiplanar, multiecho pulse sequences of the brain and surrounding structures were obtained without intravenous contrast.  COMPARISON:  CT angiogram head/neck performed earlier today 04/06/2020.  FINDINGS: Brain:  Cerebral volume is normal for age.  Mild multifocal T2/FLAIR hyperintensity within the cerebral white matter is nonspecific, but compatible with chronic small vessel ischemic disease.  There is no acute infarct.  No evidence of intracranial mass.  No chronic intracranial blood  products.  No extra-axial fluid collection.  No midline shift.  Vascular: Expected proximal arterial flow voids.  Skull and upper cervical spine: No focal marrow lesion.  Sinuses/Orbits: Visualized orbits show no acute finding. Small volume fluid within the left sphenoid sinus. Mild bilateral maxillary sinus mucosal thickening. Superimposed small right maxillary sinus mucous retention cyst.  Other: Trace fluid within the bilateral mastoid air cells.  IMPRESSION: No evidence of acute intracranial abnormality.  Mild cerebral white matter chronic small vessel ischemic disease.  Left sphenoid and bilateral maxillary sinus disease, as described.   Electronically Signed   By: Rockey Childs DO   On: 04/06/2020 18:52

## 2023-11-26 ENCOUNTER — Other Ambulatory Visit: Payer: Self-pay | Admitting: Nurse Practitioner

## 2023-11-26 DIAGNOSIS — G43009 Migraine without aura, not intractable, without status migrainosus: Secondary | ICD-10-CM

## 2023-11-27 ENCOUNTER — Inpatient Hospital Stay
Admission: RE | Admit: 2023-11-27 | Discharge: 2023-11-27 | Disposition: A | Discharge status: Home / Self Care | Source: Ambulatory Visit | Attending: Neurology | Admitting: Neurology

## 2023-11-28 ENCOUNTER — Telehealth: Payer: Self-pay

## 2023-11-28 NOTE — Telephone Encounter (Signed)
 Patient called front desk. She wanted you and Dr. Janjua to know that she did not get her blood patch because they canceled because she did not get clearance from heart doctor. She will have to reschedule and stated that it would be another 2 weeks. She wanted to know if there was anyone else that you all could recommend.   I recommended that the patient reach out to Dr. Lane to see if they can do it over [facility]. I reached out to Clarita Ricker [facility] Diagnostic Scheduler to see if she could assist at all in getting the patient seen sooner.   Patient will call if there are any updates.

## 2023-11-30 ENCOUNTER — Other Ambulatory Visit: Payer: Self-pay

## 2023-11-30 NOTE — Progress Notes (Signed)
 Patient for IR Blood Patch on Friday 12/01/23, I called and spoke with the patient on the phone and gave pre-procedure instructions. Pt was made aware to be here at 1:15p and check into the H&V entrance. Pt stated understanding. Pt will need to stay 1 hour post procedure and Erin and Cathy in [facility] are aware.  Called 11/30/23

## 2023-12-01 ENCOUNTER — Ambulatory Visit
Admission: RE | Admit: 2023-12-01 | Discharge: 2023-12-01 | Disposition: A | Discharge status: Home / Self Care | Source: Ambulatory Visit | Attending: Neurology | Admitting: Neurology

## 2023-12-01 DIAGNOSIS — G96811 Intracranial hypotension, spontaneous: Secondary | ICD-10-CM | POA: Insufficient documentation

## 2023-12-01 HISTORY — PX: IR FL GUIDED LOC OF NEEDLE/CATH TIP FOR SPINAL INJECTION LT: IMG2396

## 2023-12-01 MED ORDER — IOHEXOL 180 MG/ML  SOLN
10.0000 mL | Freq: Once | INTRAMUSCULAR | Status: AC | PRN
  Administered 2023-12-01: 1 mL

## 2023-12-01 MED ORDER — LIDOCAINE 1 % OPTIME INJ - NO CHARGE
5.0000 mL | Freq: Once | INTRAMUSCULAR | Status: AC
  Administered 2023-12-01: 2 mL
  Filled 2023-12-01: qty 6

## 2023-12-01 NOTE — Progress Notes (Signed)
 Patient has remained clinically stable post LP blood patch, tolerated well. Discharge instructions given, with questions answered. Husband present. Aware to continue with increasing po fluids,over next 24 hours, as well as flat as per protocol. Stated understanding.

## 2023-12-01 NOTE — Procedures (Signed)
 Interventional Radiology Procedure Note  Procedure: L2-L3 epidural blood patch  Complications: None  Estimated Blood Loss: None  Recommendations: - Bedrest x 1 hr then DC home   Signed,  Wilkie LOIS Lent, MD

## 2023-12-10 ENCOUNTER — Other Ambulatory Visit: Payer: Self-pay | Admitting: Nurse Practitioner

## 2023-12-10 DIAGNOSIS — F320 Major depressive disorder, single episode, mild: Secondary | ICD-10-CM

## 2023-12-10 DIAGNOSIS — G43009 Migraine without aura, not intractable, without status migrainosus: Secondary | ICD-10-CM

## 2023-12-19 ENCOUNTER — Ambulatory Visit: Admitting: Neurosurgery

## 2023-12-19 ENCOUNTER — Encounter: Payer: Self-pay | Admitting: Neurosurgery

## 2023-12-19 VITALS — BP 117/79 | HR 92 | Temp 97.7°F | Ht 67.5 in | Wt 235.6 lb

## 2023-12-19 DIAGNOSIS — G96810 Intracranial hypotension, unspecified: Secondary | ICD-10-CM | POA: Diagnosis not present

## 2023-12-19 NOTE — Progress Notes (Signed)
 68 year old lady suspected to have intracranial hypotension given the symptomatology.  She laid flat prior to and after her blood patch and this did not make any difference unfortunately.  I told her that the next steps would be is that we try to pursue a CT myelogram of the cervical, thoracic and lumbar spine.  I also noted in the request that I would like to check the opening pressure upon the lumbar puncture as well.  Once this is done I would like to see if there is a location that can be found with a CSF leak might be so we can direct our therapy to her at that location.  I told her that sometimes we do not find any location at all but we will cross that bridge if we need to.

## 2023-12-26 ENCOUNTER — Telehealth: Payer: Self-pay | Admitting: Neurosurgery

## 2023-12-26 ENCOUNTER — Other Ambulatory Visit: Payer: Self-pay | Admitting: Nurse Practitioner

## 2023-12-26 DIAGNOSIS — E1165 Type 2 diabetes mellitus with hyperglycemia: Secondary | ICD-10-CM

## 2023-12-26 NOTE — Telephone Encounter (Signed)
 Per Wilbern Dills at [facility]. Patient does not need to hold Eliquis . Patient previously had a blood patch which required her holding this medication so she wanted to be sure.  I called the patient and confirmed this with her.

## 2023-12-26 NOTE — Telephone Encounter (Signed)
 Patient called stating she has imaging scheduled with [facility] on 01/02/24 and she needs to know if being on Eliquis  would cause any issue. She said she advised them of the Eliquis  during scheduling. Previously her appointments had to be rescheduled at the last minute due to not stopping the Eliquis . Please advise

## 2023-12-28 ENCOUNTER — Ambulatory Visit: Admitting: Nurse Practitioner

## 2023-12-28 VITALS — BP 112/80 | HR 100 | Temp 98.2°F | Ht 67.5 in | Wt 234.2 lb

## 2023-12-28 DIAGNOSIS — E1165 Type 2 diabetes mellitus with hyperglycemia: Secondary | ICD-10-CM | POA: Diagnosis not present

## 2023-12-28 LAB — POCT GLYCOSYLATED HEMOGLOBIN (HGB A1C): Hemoglobin A1C: 6.6 % — AB (ref 4.0–5.6)

## 2023-12-28 NOTE — Patient Instructions (Signed)
 Nice to see you today I want to discontinue the metformin  for now Follow up with me in 3 months

## 2023-12-28 NOTE — Progress Notes (Signed)
 Established Patient Office Visit  Subjective   Patient ID: Joanne Clark, female    DOB: [birth year]  Age: 68 y.o. MRN: [id number]  Chief Complaint  Patient presents with   Diabetes   Medication Problem    Metformin . Pt complains of having bowel issues while being on medication.     HPI  Discussed the use of AI scribe software for clinical note transcription with the patient, who gave verbal consent to proceed.  History of Present Illness Joanne Clark is a 68 year old female with type 2 diabetes who presents for follow-up of elevated A1c and gastrointestinal side effects from medication.  Her A1c has increased from 6.4% to 7.3% over the past year, leading to the initiation of extended-release metformin . Since starting the medication, she has experienced significant constipation, necessitating additional fiber intake to manage her bowel movements. Despite previous weight loss efforts, her weight has increased from 220 lbs to 234 lbs.  She experiences episodes of jitteriness in the afternoons, which she attributes to reducing her sugar intake. To manage these episodes, she consumes sugary tea. She has not been regularly checking her blood sugar levels, but when she has, they have been around 100 mg/dL. Her dietary habits include eating two to three meals a day, with oatmeal in the morning and a substantial late lunch. She primarily drinks water and homemade chai tea with minimal milk and unsweetened creamer, and has significantly reduced her intake of sugary [employer] drinks.  Physical activity is limited due to a recent blood patch procedure, and she is currently unable to engage in exercise. She is awaiting further tests and is under the care of a neurologist for ongoing issues.  She reports persistent pain in her arm, particularly when lifting objects, which has not improved. She is unable to pursue physical therapy until her neurological issues are resolved.     Review of  Systems  Constitutional:  Negative for chills and fever.  Respiratory:  Negative for shortness of breath.   Cardiovascular:  Negative for chest pain.  Gastrointestinal:  Positive for constipation.      Objective:     BP 112/80   Pulse 100   Temp 98.2 F (36.8 C) (Oral)   Ht 5' 7.5 (1.715 m)   Wt 234 lb 3.2 oz (106.2 kg)   SpO2 96%   BMI 36.14 kg/m  BP Readings from Last 3 Encounters:  12/28/23 112/80  12/19/23 117/79  12/01/23 117/72   Wt Readings from Last 3 Encounters:  12/28/23 234 lb 3.2 oz (106.2 kg)  12/19/23 235 lb 9.6 oz (106.9 kg)  12/01/23 230 lb (104.3 kg)   SpO2 Readings from Last 3 Encounters:  12/28/23 96%  12/19/23 98%  12/01/23 97%      Physical Exam Vitals and nursing note reviewed.  Constitutional:      Appearance: Normal appearance.  Cardiovascular:     Rate and Rhythm: Normal rate and regular rhythm.     Heart sounds: Normal heart sounds.  Pulmonary:     Effort: Pulmonary effort is normal.     Breath sounds: Normal breath sounds.  Abdominal:     General: Bowel sounds are normal.  Neurological:     Mental Status: She is alert.      Results for orders placed or performed in visit on 12/28/23  POCT glycosylated hemoglobin (Hb A1C)  Result Value Ref Range   Hemoglobin A1C 6.6 (A) 4.0 - 5.6 %   HbA1c POC (<> result, manual entry)  HbA1c, POC (prediabetic range)     HbA1c, POC (controlled diabetic range)        The ASCVD Risk score (Arnett DK, et al., 2019) failed to calculate for the following reasons:   Risk score cannot be calculated because patient has a medical history suggesting prior/existing ASCVD   * - Cholesterol units were assumed    Assessment & Plan:   Problem List Items Addressed This Visit   None Visit Diagnoses       Type 2 diabetes mellitus with hyperglycemia, without long-term current use of insulin (HCC)    -  Primary   Relevant Orders   POCT glycosylated hemoglobin (Hb A1C) (Completed)       Assessment and Plan Assessment & Plan Type 2 diabetes mellitus A1c increased to 7.3, now 6.6. Hypoglycemia likely from dietary changes and metformin . Constipation from metformin . - Discontinued metformin . - Continue dietary modifications: reduce sugar, increase protein and vegetables, moderate carbohydrates. - Recheck A1c in three months.  Obesity Weight increased to 234 lbs, possibly due to medication and reduced activity. Unable to exercise due to neurological issues. - Continue dietary modifications as for diabetes. - Encouraged walking as tolerated. - Reassess weight and activity post-neurological resolution.  General Health Maintenance Pneumonia vaccine last in 2020.  - Update pneumonia vaccine post-neurological resolution.    Return in about 3 months (around 03/27/2024) for DM recheck.    Adina Crandall, NP

## 2024-01-01 NOTE — Discharge Instructions (Signed)

## 2024-01-01 NOTE — Discharge Instructions (Signed)

## 2024-01-02 ENCOUNTER — Inpatient Hospital Stay
Admission: RE | Admit: 2024-01-02 | Discharge: 2024-01-02 | Disposition: A | Source: Ambulatory Visit | Attending: Neurosurgery | Admitting: Neurosurgery

## 2024-01-02 ENCOUNTER — Other Ambulatory Visit

## 2024-01-02 ENCOUNTER — Ambulatory Visit: Admitting: Neurosurgery

## 2024-01-02 ENCOUNTER — Inpatient Hospital Stay: Admission: RE | Admit: 2024-01-02 | Discharge: 2024-01-02 | Attending: Neurosurgery | Admitting: Neurosurgery

## 2024-01-05 NOTE — Discharge Instructions (Signed)

## 2024-01-05 NOTE — Discharge Instructions (Signed)

## 2024-01-08 ENCOUNTER — Other Ambulatory Visit

## 2024-01-08 ENCOUNTER — Inpatient Hospital Stay: Admission: RE | Admit: 2024-01-08 | Discharge: 2024-01-08 | Attending: Neurosurgery | Admitting: Neurosurgery

## 2024-01-08 ENCOUNTER — Inpatient Hospital Stay
Admission: RE | Admit: 2024-01-08 | Discharge: 2024-01-08 | Disposition: A | Source: Ambulatory Visit | Attending: Neurosurgery | Admitting: Neurosurgery

## 2024-01-08 DIAGNOSIS — G96810 Intracranial hypotension, unspecified: Secondary | ICD-10-CM

## 2024-01-08 MED ORDER — MEPERIDINE HCL 50 MG/ML IJ SOLN: 50.0000 mg | Freq: Once | INTRAMUSCULAR | Status: DC | PRN

## 2024-01-08 MED ORDER — ONDANSETRON HCL 4 MG/2ML IJ SOLN: 4.0000 mg | Freq: Once | INTRAMUSCULAR | Status: DC | PRN

## 2024-01-08 MED ORDER — DIAZEPAM 5 MG PO TABS: 5.0000 mg | ORAL_TABLET | Freq: Once | ORAL | Status: DC

## 2024-01-08 MED ADMIN — Iopamidol Inj 61%: 10 mL | INTRATHECAL | NDC 00270141215

## 2024-01-19 ENCOUNTER — Ambulatory Visit: Admitting: Neurosurgery

## 2024-01-19 ENCOUNTER — Encounter: Payer: Self-pay | Admitting: Neurosurgery

## 2024-01-19 VITALS — BP 134/79 | HR 89 | Temp 97.8°F | Ht 67.5 in | Wt 235.0 lb

## 2024-01-19 DIAGNOSIS — G96810 Intracranial hypotension, unspecified: Secondary | ICD-10-CM

## 2024-01-19 NOTE — Progress Notes (Signed)
 69 year old lady with history of severe sneezing spells resulting in postural headaches with MRI suggesting intracranial hypotension.  We did a lumbar puncture with a CT myelogram and this shows an opening pressure of 9 cm and unfortunately no leak source was found.  I shared the news with her and I feel that she needs a workup done by one of the radiology interventionalists who can find the source for the leak.  To that end, I recommended Dr. Belvie Daring, interventional neuroradiologist at [facility] who specializes in this and I have reached out to him and conveyed her information to him.  If there is anything I can do for her, she will let me know.

## 2024-01-19 NOTE — Progress Notes (Signed)
 69 year old female previously evaluated on 12/2 with concern for intracranial hypotension. Underwent diagnostic lumbar puncture with lumbar myelography on 12/22. No CSF leak was identified, nor any high grade spinal stenosis. Opening pressure was measured at 9cm water. Following the lumbar puncture, the patient felt largely unchanged overall, though she did experience an immediate post procedure headache.   She presents today with persistent headaches that are exacerbated by coughing and sneezing, bending forward, and are worse when lying down. Pain is consistently localized to the right side of the head during headache episodes.   Pain control limited due to medication restrictions. Has a rash reaction to acetaminophen  and cannot take ASA due to Eliquis .

## 2024-01-22 ENCOUNTER — Telehealth: Payer: Self-pay | Admitting: Nurse Practitioner

## 2024-01-22 DIAGNOSIS — G96810 Intracranial hypotension, unspecified: Secondary | ICD-10-CM

## 2024-01-22 NOTE — Telephone Encounter (Signed)
 Dr. Rosslyn Handing called the office and stated that you need me to refer her to someone. Do you need me to refer her somewhere?

## 2024-01-22 NOTE — Telephone Encounter (Signed)
 Copied from CRM [id number]. Topic: Referral - Request for Referral >> Jan 22, 2024 10:56 AM Montie POUR wrote: Did the patient discuss referral with their provider in the last year? Yes (If No - schedule appointment) (If Yes - send message)  Appointment offered? No  Type of order/referral and detailed reason for visit: Dr. Rosslyn wants NP Wendee to refer to Dr. Graig. Dr. Rosslyn did not find a leak. Dr. Rosslyn wants a referral sent over today as soon as possible.   Preference of office, provider, location: DOROTHA Cheryl Graig, MD; [address] [place], [place] [address]; Phone: [phone]  If referral order, have you been seen by this specialty before? No (If Yes, this issue or another issue? When? Where?  Can we respond through MyChart? No - Her number is [phone] to discuss.

## 2024-01-22 NOTE — Telephone Encounter (Signed)
 I have messaged Dr. Janjua. Looking at his last noted he had reached out to Dr. Delores already

## 2024-01-23 NOTE — Telephone Encounter (Signed)
"  See other encounter.  "

## 2024-01-23 NOTE — Telephone Encounter (Signed)
"  Referral placed   "

## 2024-01-23 NOTE — Telephone Encounter (Signed)
 Called and spoke with patient.  Pt states that she is already aware of Dr. Rosslyn reaching out to Dr. Delores clinic. States that she is scheduled for the 15th for consultation but their office needs a referral from PCP.  Please advise.

## 2024-01-23 NOTE — Telephone Encounter (Signed)
 Called and spoke with patient. Informed her of referral placed for Dr. Delores.  Pt verbalized understanding and has no questions or concerns at this time.  Advised to call back if there are any concerns.

## 2024-01-23 NOTE — Telephone Encounter (Signed)
 Can we reach out to Tionna and let her know that Dr. Janjua is going to get her into Dr. Jonna clinic   If that is what she needed she should be good. If not please let me know

## 2024-02-05 ENCOUNTER — Other Ambulatory Visit: Payer: Self-pay | Admitting: Nurse Practitioner

## 2024-02-05 DIAGNOSIS — G96810 Intracranial hypotension, unspecified: Secondary | ICD-10-CM

## 2024-02-05 NOTE — Progress Notes (Signed)
 Referral placed

## 2024-02-14 ENCOUNTER — Ambulatory Visit (INDEPENDENT_AMBULATORY_CARE_PROVIDER_SITE_OTHER): Admitting: Primary Care

## 2024-02-14 ENCOUNTER — Ambulatory Visit: Payer: Self-pay

## 2024-02-14 VITALS — BP 130/70 | HR 108 | Temp 98.6°F | Ht 67.5 in | Wt 235.6 lb

## 2024-02-14 DIAGNOSIS — G43909 Migraine, unspecified, not intractable, without status migrainosus: Secondary | ICD-10-CM

## 2024-02-14 DIAGNOSIS — J069 Acute upper respiratory infection, unspecified: Secondary | ICD-10-CM | POA: Diagnosis not present

## 2024-02-14 MED ORDER — BENZONATATE 200 MG PO CAPS: 200.0000 mg | ORAL_CAPSULE | Freq: Three times a day (TID) | ORAL | 0 refills | Status: AC | PRN

## 2024-02-14 NOTE — Telephone Encounter (Signed)
 Extensive history of intracranial hypotension, is scheduled for a blood patch procedure next week. Has had a cough for 3 days which is worsening her chronic HA. Appt scheduled for today.   FYI Only or Action Required?: FYI only for provider: appointment scheduled on 1/28.  Patient was last seen in primary care on 12/28/2023 by Wendee Lynwood HERO, NP.  Called Nurse Triage reporting Cough.  Symptoms began several days ago.  Interventions attempted: Nothing.  Symptoms are: gradually worsening.  Triage Disposition: See Physician Within 24 Hours  Patient/caregiver understands and will follow disposition?: Yes       Reason for Triage: horrible cough and severe head ache gotten worse in three days. When she coughs, it exhasterbates the fluid in her head around her brain.   Reason for Disposition  SEVERE coughing spells (e.g., whooping sound after coughing, vomiting after coughing)  Answer Assessment - Initial Assessment Questions 1. ONSET: When did the cough begin?      3 days ago  2. SEVERITY: How bad is the cough today?      It's wicked, when she gets started it's horrible   5. DIFFICULTY BREATHING: Are you having difficulty breathing? If Yes, ask: How bad is it? (e.g., mild, moderate, severe)      Husband denies  6. FEVER: Do you have a fever? If Yes, ask: What is your temperature, how was it measured, and when did it start?     Husband denies  10. OTHER SYMPTOMS: Do you have any other symptoms? (e.g., runny nose, wheezing, chest pain) Chronic HA exacerbated by cough  Protocols used: Cough - Acute Non-Productive-A-AH

## 2024-02-14 NOTE — Assessment & Plan Note (Signed)
 Symptoms and exam representative. Fortunately, respiratory exam is reassuring.  We discussed conservative treatment. Okay to take Coricidin as needed.  Start Benzonatate  capsules for cough. Take 1 capsule by mouth three times daily as needed for cough. Toradol  60 mg provided intramuscularly today for her headache.  She will update in 2 days if her symptoms persist or if no improvement.

## 2024-02-14 NOTE — Telephone Encounter (Signed)
 Appt scheduled with Mallie Gaskins, NP today

## 2024-02-14 NOTE — Progress Notes (Signed)
 "  Subjective:    Patient ID: Joanne Clark, female    DOB: 10-22-1955, 69 y.o.   MRN: [id number]  Joanne Clark is a very pleasant 69 y.o. female patient of Matt, NP with a history of atrial fibrillation, CHF, migraines, sinusitis, CAD with STEMI who presents today to discuss URI symptoms.  Symptom onset 7 days ago with dry and light cough. Since then she developed chest congestion and an increase in her chronic headaches. Her cough has been persistent.   She has a history of migraines and spontaneous intractable hypotension and is followed by neurology. She is due next week for blood patch matching. Per neurology, she's not allowed to cough, sneeze, or perform Valsalva maneuver due to her condition.  Her cough certainly makes it difficult to avoid these actions.  She denies fevers. She's not taken anything OTC.  She questions if she can take Coricidin.  Today she's feeling about the same, no better and no worse.   Review of Systems  Constitutional:  Negative for fever.  HENT:  Positive for congestion. Negative for ear pain, postnasal drip and sore throat.   Respiratory:  Positive for cough.          Past Medical History:  Diagnosis Date   Depression    GERD (gastroesophageal reflux disease)    Headache    Hiatal hernia    Hyperlipemia    Hypertension    Multiple gastric ulcers    Myocardial infarction [facility])    STEMI (ST elevation myocardial infarction) (HCC) 04/10/2018    Social History   Socioeconomic History   Marital status: Married    Spouse name: Joanne Clark   Number of children: 1   Years of education: some college   Highest education level: Not on file  Occupational History   Not on file  Tobacco Use   Smoking status: Never   Smokeless tobacco: Never  Vaping Use   Vaping status: Never Used  Substance and Sexual Activity   Alcohol use: No   Drug use: No   Sexual activity: Not Currently  Other Topics Concern   Not on file  Social History Narrative    01/16/20   From: [place] originally, moved 2021 to be near daughter   Living: with husband, Joanne Clark [phone])   Work: retired Occupational Hygienist      Family: daughter Josette      Enjoys: reading, games, walking the park       Exercise: walking 2-3 times a week   Diet: tires to do heart healthy diet      Safety   Seat belts: Yes    Guns: Yes  and secure   Safe in relationships: Yes    Social Drivers of Health   Tobacco Use: Low Risk (02/01/2024)   Received from [facility]   Patient History    Smoking Tobacco Use: Never    Smokeless Tobacco Use: Never    Passive Exposure: Not on file  Financial Resource Strain: Low Risk  (11/08/2023)   Received from [facility]   Overall Financial Resource Strain (CARDIA)    Difficulty of Paying Living Expenses: Not hard at all  Food Insecurity: No Food Insecurity (11/08/2023)   Received from [facility]   Epic    Within the past 12 months, you worried that your food would run out before you got the money to buy more.: Never true    Within the past 12 months, the food you bought just  didn't last and you didn't have money to get more.: Never true  Transportation Needs: No Transportation Needs (11/08/2023)   Received from [facility] - Transportation    In the past 12 months, has lack of transportation kept you from medical appointments or from getting medications?: No    Lack of Transportation (Non-Medical): No  Physical Activity: Unknown (09/20/2023)   Exercise Vital Sign    Days of Exercise per Week: Patient declined    Minutes of Exercise per Session: Not on file  Recent Concern: Physical Activity - Insufficiently Active (07/17/2023)   Exercise Vital Sign    Days of Exercise per Week: 3 days    Minutes of Exercise per Session: 20 min  Stress: Patient Declined (09/20/2023)   [employer] of [employer]ational [employer] - Occupational Stress Questionnaire    Feeling of Stress: Patient  declined  Social Connections: Unknown (09/20/2023)   Social Connection and Isolation Panel    Frequency of Communication with Friends and Family: More than three times a week    Frequency of Social Gatherings with Friends and Family: Patient declined    Attends Religious Services: Never    Database Administrator or Organizations: Patient declined    Attends Engineer, Structural: Not on file    Marital Status: Married  Recent Concern: Social Connections - Moderately Isolated (07/17/2023)   Social Connection and Isolation Panel    Frequency of Communication with Friends and Family: More than three times a week    Frequency of Social Gatherings with Friends and Family: Once a week    Attends Religious Services: Never    Database Administrator or Organizations: No    Attends Banker Meetings: Never    Marital Status: Married  Catering Manager Violence: Not At Risk (08/16/2023)   Received from [facility]   Epic    Within the last year, have you been afraid of your partner or ex-partner?: No    Within the last year, have you been humiliated or emotionally abused in other ways by your partner or ex-partner?: No    Within the last year, have you been kicked, hit, slapped, or otherwise physically hurt by your partner or ex-partner?: No    Within the last year, have you been raped or forced to have any kind of sexual activity by your partner or ex-partner?: No  Depression (PHQ2-9): Low Risk (08/28/2023)   Depression (PHQ2-9)    PHQ-2 Score: 0  Alcohol Screen: Low Risk (07/17/2023)   Alcohol Screen    Last Alcohol Screening Score (AUDIT): 0  Housing: Low Risk  (11/08/2023)   Received from [facility]   Epic    In the last 12 months, was there a time when you were not able to pay the mortgage or rent on time?: No    In the past 12 months, how many times have you moved where you were living?: 0    At any time in the past 12 months, were you homeless or  living in a shelter (including now)?: No  Utilities: Not At Risk (11/08/2023)   Received from [facility]   Epic    In the past 12 months has the electric, gas, oil, or water company threatened to shut off services in your home?: No  Health Literacy: Adequate Health Literacy (07/17/2023)   B1300 Health Literacy    Frequency of need for help with medical instructions: Never  Past Surgical History:  Procedure Laterality Date   CHOLECYSTECTOMY  2010   COLONOSCOPY W/ ENDOSCOPIC US   10/17/2017   ESOPHAGOGASTRODUODENOSCOPY (EGD) WITH PROPOFOL  N/A 04/28/2020   Procedure: ESOPHAGOGASTRODUODENOSCOPY (EGD) WITH PROPOFOL ;  Surgeon: Unk Corinn Skiff, MD;  Location: [facility] ENDOSCOPY;  Service: Gastroenterology;  Laterality: N/A;   IR FL GUIDED LOC OF NEEDLE/CATH TIP FOR SPINAL INJECTION LT  12/01/2023   [place] tooth extraction  2024    Family History  Problem Relation Age of Onset   Dementia Mother    Diabetes Mother    Dementia Father    Diabetes Father    Heart disease Father    Cancer Sister        unknown type   Diabetes Sister    Hyperlipidemia Sister    Cervical cancer Sister    Diabetes Brother     Allergies[1]  Medications Ordered Prior to Encounter[2]  BP 130/70   Pulse (!) 108   Temp 98.6 F (37 C) (Oral)   Ht 5' 7.5 (1.715 m)   Wt 235 lb 9.6 oz (106.9 kg)   SpO2 98%   BMI 36.36 kg/m  Objective:   Physical Exam Constitutional:      Appearance: She is ill-appearing.  HENT:     Right Ear: Tympanic membrane and ear canal normal.     Left Ear: Tympanic membrane and ear canal normal.     Nose: No mucosal edema.     Right Sinus: No maxillary sinus tenderness or frontal sinus tenderness.     Left Sinus: No maxillary sinus tenderness or frontal sinus tenderness.     Mouth/Throat:     Mouth: Mucous membranes are moist.  Eyes:     Conjunctiva/sclera: Conjunctivae normal.  Cardiovascular:     Rate and Rhythm: Normal rate and regular rhythm.   Pulmonary:     Effort: Pulmonary effort is normal.     Breath sounds: Normal breath sounds. No wheezing or rhonchi.     Comments: Dry cough noted several times during visit  Musculoskeletal:     Cervical back: Neck supple.  Skin:    General: Skin is warm and dry.     Physical Exam        Assessment & Plan:  Viral URI with cough Assessment & Plan: Symptoms and exam representative. Fortunately, respiratory exam is reassuring.  We discussed conservative treatment. Okay to take Coricidin as needed.  Start Benzonatate  capsules for cough. Take 1 capsule by mouth three times daily as needed for cough. Toradol  60 mg provided intramuscularly today for her headache.  She will update in 2 days if her symptoms persist or if no improvement.    Orders: -     Benzonatate ; Take 1 capsule (200 mg total) by mouth 3 (three) times daily as needed for cough.  Dispense: 15 capsule; Refill: 0  Migraine without status migrainosus, not intractable, unspecified migraine type Assessment & Plan: Uncontrolled given recent URI symptoms.  Discussed options for treatment. Toradol  60 mg provided intramuscularly today as she has limited options otherwise.  Follow-up with neurology as scheduled      Assessment and Plan Assessment & Plan         Comer MARLA Gaskins, NP       [1]  Allergies Allergen Reactions   Gluten Meal     Other reaction(s): GI Reaction   Methylprednisolone  Other (See Comments)    Injectable form: caused body jerks/spasms - in [place] [facility]   Xyzal  [Levocetirizine] Other (See Comments)  Causes nose bleeds  [2]  Current Outpatient Medications on File Prior to Visit  Medication Sig Dispense Refill   apixaban  (ELIQUIS ) 5 MG TABS tablet Take 1 tablet by mouth 2 (two) times daily. cardio     atorvastatin  (LIPITOR) 80 MG tablet Take 1 tablet (80 mg total) by mouth daily. For cholesterol. Will need to be seen in office for more refills. 90 tablet 0    cyclobenzaprine  (FLEXERIL ) 5 MG tablet TAKE 1 TABLET BY MOUTH 2 TIMES DAILY AS NEEDED FOR MUSCLE SPASMS. 30 tablet 0   furosemide (LASIX) 20 MG tablet Take 20 mg by mouth as needed. cardio     Lancets Misc. MISC 1 each by Does not apply route daily. May substitute to any manufacturer covered by patient's insurance. 100 each 0   lisinopril  (ZESTRIL ) 2.5 MG tablet Take 2.5 mg by mouth daily.     metoprolol  succinate (TOPROL -XL) 25 MG 24 hr tablet Take 12.5 mg by mouth daily. cardio     nitroGLYCERIN (NITROSTAT) 0.4 MG SL tablet Place under the tongue. cardio     nortriptyline (PAMELOR) 10 MG capsule Take 20 mg by mouth at bedtime.     pantoprazole  (PROTONIX ) 40 MG tablet TAKE 1 TABLET BY MOUTH EVERY DAY 90 tablet 1   sertraline  (ZOLOFT ) 25 MG tablet TAKE 1 TABLET (25 MG TOTAL) BY MOUTH DAILY. 90 tablet 1   Blood Glucose Monitoring Suppl DEVI 1 each by Does not apply route daily. May substitute to any manufacturer covered by patient's insurance. (Patient not taking: Reported on 02/14/2024) 1 each 0   Glucose Blood (BLOOD GLUCOSE TEST STRIPS) STRP 1 each by In Vitro route daily. May substitute to any manufacturer covered by patient's insurance. (Patient not taking: Reported on 02/14/2024) 100 strip 0   No current facility-administered medications on file prior to visit.   "

## 2024-02-14 NOTE — Patient Instructions (Signed)
 Start Benzonatate  capsules for cough. Take 1 capsule by mouth three times daily as needed for cough.  Please message or call me Friday this week if your symptoms have not improved or if they have progressed.  Follow up with neurology.  It was a pleasure meeting you!

## 2024-02-14 NOTE — Telephone Encounter (Signed)
 Patient evaluated.

## 2024-02-14 NOTE — Assessment & Plan Note (Signed)
 Uncontrolled given recent URI symptoms.  Discussed options for treatment. Toradol  60 mg provided intramuscularly today as she has limited options otherwise.  Follow-up with neurology as scheduled

## 2024-02-16 ENCOUNTER — Ambulatory Visit: Payer: Self-pay

## 2024-02-16 NOTE — Telephone Encounter (Signed)
 Pt symptoms improving. She followed up with neuro and it was agreed that there is no need for her to come in before her blood patch procedure. Pt instructed to call back if anything changes or worsens. She thanked us  for our help.   FYI Only or Action Required?: FYI only for provider: pt symptoms improving.  Patient was last seen in primary care on 02/14/2024 by Gretta Comer POUR, NP.  Called Nurse Triage reporting Cough and Headache.  Symptoms began several days ago.  Interventions attempted: Prescription medications: benzonatate .  Symptoms are: gradually improving.  Triage Disposition: Home Care  Patient/caregiver understands and will follow disposition?: Yes     Reason for Triage: Patient states she was calling to leave a message to PCP concerning how she is feeling. Patient spoke to NT 02-14-24, and patient feels about the same, severe painful headache, and extreme cough.   Reason for Disposition  Common cold with no complications  Answer Assessment - Initial Assessment Questions 1. ONSET: When did the nasal discharge start?      Has been ongoing, saw NP 1/28   2. AMOUNT: How much discharge is there?      Some congestion   3. COUGH: Do you have a cough? If Yes, ask: Describe the color of your mucus. (e.g., clear, white, yellow, green)     Yes, not coughing as much.   4. RESPIRATORY DISTRESS: Describe your breathing.      Pt is noted to be speaking in multiple full sentences at a time.   5. FEVER: Do you have a fever? If Yes, ask: What is your temperature, how was it measured, and when did it start?     Denies  6. SEVERITY: Overall, how bad are you feeling right now? (e.g., doesn't interfere with normal activities, staying home from school/work, staying in bed)      Pt states she is feeling better than 1/28 appt  7. OTHER SYMPTOMS: Do you have any other symptoms? (e.g., earache, mouth sores, sore throat, wheezing)     HA (chronic for  pt)  Protocols used: Common Cold-A-AH

## 2024-03-27 ENCOUNTER — Ambulatory Visit: Admitting: Nurse Practitioner

## 2024-07-17 ENCOUNTER — Ambulatory Visit
